# Patient Record
Sex: Male | Born: 1935 | Race: Black or African American | Hispanic: No | Marital: Married | Smoking: Former smoker
Health system: Southern US, Community
[De-identification: ages and names within clinical notes are randomized; demographics above are authoritative.]

## PROBLEM LIST (undated history)

## (undated) DIAGNOSIS — J8410 Pulmonary fibrosis, unspecified: Secondary | ICD-10-CM

## (undated) DIAGNOSIS — E785 Hyperlipidemia, unspecified: Secondary | ICD-10-CM

## (undated) DIAGNOSIS — I2510 Atherosclerotic heart disease of native coronary artery without angina pectoris: Secondary | ICD-10-CM

## (undated) DIAGNOSIS — G4733 Obstructive sleep apnea (adult) (pediatric): Secondary | ICD-10-CM

## (undated) DIAGNOSIS — I509 Heart failure, unspecified: Secondary | ICD-10-CM

## (undated) DIAGNOSIS — N184 Chronic kidney disease, stage 4 (severe): Secondary | ICD-10-CM

## (undated) DIAGNOSIS — I10 Essential (primary) hypertension: Secondary | ICD-10-CM

## (undated) DIAGNOSIS — J449 Chronic obstructive pulmonary disease, unspecified: Secondary | ICD-10-CM

## (undated) DIAGNOSIS — I272 Pulmonary hypertension, unspecified: Secondary | ICD-10-CM

## (undated) DIAGNOSIS — E119 Type 2 diabetes mellitus without complications: Secondary | ICD-10-CM

## (undated) DIAGNOSIS — I5032 Chronic diastolic (congestive) heart failure: Secondary | ICD-10-CM

## (undated) HISTORY — DX: Pulmonary hypertension, unspecified: I27.20

## (undated) HISTORY — DX: Essential (primary) hypertension: I10

## (undated) HISTORY — PX: KNEE SURGERY: SHX244

## (undated) HISTORY — PX: BACK SURGERY: SHX140

## (undated) HISTORY — PX: OTHER SURGICAL HISTORY: SHX169

## (undated) HISTORY — DX: Type 2 diabetes mellitus without complications: E11.9

## (undated) HISTORY — PX: HERNIA REPAIR: SHX51

---

## 2004-06-12 ENCOUNTER — Emergency Department: Payer: Self-pay | Admitting: General Practice

## 2005-10-11 ENCOUNTER — Ambulatory Visit: Payer: Self-pay | Admitting: Oncology

## 2005-10-22 ENCOUNTER — Ambulatory Visit: Payer: Self-pay | Admitting: Gastroenterology

## 2006-01-16 ENCOUNTER — Emergency Department: Payer: Self-pay | Admitting: Internal Medicine

## 2006-06-16 ENCOUNTER — Ambulatory Visit: Payer: Self-pay | Admitting: Surgery

## 2006-06-23 ENCOUNTER — Ambulatory Visit: Payer: Self-pay | Admitting: Surgery

## 2007-07-08 ENCOUNTER — Ambulatory Visit: Payer: Self-pay | Admitting: Cardiovascular Disease

## 2007-11-23 ENCOUNTER — Ambulatory Visit: Payer: Self-pay | Admitting: Specialist

## 2007-11-23 ENCOUNTER — Other Ambulatory Visit: Payer: Self-pay

## 2007-12-01 ENCOUNTER — Inpatient Hospital Stay: Payer: Self-pay | Admitting: Specialist

## 2007-12-05 ENCOUNTER — Encounter: Payer: Self-pay | Admitting: Internal Medicine

## 2007-12-28 ENCOUNTER — Encounter: Payer: Self-pay | Admitting: Internal Medicine

## 2010-01-06 ENCOUNTER — Emergency Department: Payer: Self-pay | Admitting: Emergency Medicine

## 2010-01-28 ENCOUNTER — Emergency Department: Payer: Self-pay | Admitting: Emergency Medicine

## 2010-12-21 ENCOUNTER — Encounter: Payer: Self-pay | Admitting: Podiatry

## 2010-12-21 DIAGNOSIS — M1990 Unspecified osteoarthritis, unspecified site: Secondary | ICD-10-CM

## 2010-12-21 DIAGNOSIS — I519 Heart disease, unspecified: Secondary | ICD-10-CM | POA: Insufficient documentation

## 2011-02-12 ENCOUNTER — Inpatient Hospital Stay: Payer: Self-pay | Admitting: Internal Medicine

## 2011-04-29 ENCOUNTER — Emergency Department: Payer: Self-pay | Admitting: Emergency Medicine

## 2011-09-18 ENCOUNTER — Ambulatory Visit: Payer: Self-pay | Admitting: Gastroenterology

## 2011-09-24 ENCOUNTER — Ambulatory Visit: Payer: Self-pay | Admitting: Gastroenterology

## 2013-01-11 ENCOUNTER — Emergency Department: Payer: Self-pay | Admitting: Emergency Medicine

## 2013-01-13 LAB — BASIC METABOLIC PANEL
BUN: 30 mg/dL — ABNORMAL HIGH (ref 7–18)
Calcium, Total: 8.8 mg/dL (ref 8.5–10.1)
Co2: 27 mmol/L (ref 21–32)
Creatinine: 2.87 mg/dL — ABNORMAL HIGH (ref 0.60–1.30)
EGFR (African American): 23 — ABNORMAL LOW
EGFR (Non-African Amer.): 20 — ABNORMAL LOW
Glucose: 173 mg/dL — ABNORMAL HIGH (ref 65–99)
Potassium: 3.7 mmol/L (ref 3.5–5.1)
Sodium: 135 mmol/L — ABNORMAL LOW (ref 136–145)

## 2013-01-13 LAB — CBC
MCHC: 33.4 g/dL (ref 32.0–36.0)
MCV: 74 fL — ABNORMAL LOW (ref 80–100)
Platelet: 170 10*3/uL (ref 150–440)
RDW: 15.8 % — ABNORMAL HIGH (ref 11.5–14.5)
WBC: 13.2 10*3/uL — ABNORMAL HIGH (ref 3.8–10.6)

## 2013-01-13 LAB — BETA STREP CULTURE(ARMC)

## 2013-01-13 LAB — CK TOTAL AND CKMB (NOT AT ARMC)
CK, Total: 668 U/L — ABNORMAL HIGH (ref 35–232)
CK-MB: 1.5 ng/mL (ref 0.5–3.6)

## 2013-01-14 ENCOUNTER — Inpatient Hospital Stay: Payer: Self-pay | Admitting: Family Medicine

## 2013-01-14 LAB — PRO B NATRIURETIC PEPTIDE: B-Type Natriuretic Peptide: 1240 pg/mL — ABNORMAL HIGH (ref 0–450)

## 2013-01-15 LAB — CBC WITH DIFFERENTIAL/PLATELET
Basophil %: 0.3 %
Eosinophil #: 0.7 10*3/uL (ref 0.0–0.7)
Eosinophil %: 5.7 %
HCT: 34.2 % — ABNORMAL LOW (ref 40.0–52.0)
HGB: 11.7 g/dL — ABNORMAL LOW (ref 13.0–18.0)
Lymphocyte #: 1.3 10*3/uL (ref 1.0–3.6)
Lymphocyte %: 9.8 %
MCH: 25.1 pg — ABNORMAL LOW (ref 26.0–34.0)
MCHC: 34.1 g/dL (ref 32.0–36.0)
Monocyte #: 1 x10 3/mm (ref 0.2–1.0)
Platelet: 186 10*3/uL (ref 150–440)
RBC: 4.66 10*6/uL (ref 4.40–5.90)
RDW: 15.7 % — ABNORMAL HIGH (ref 11.5–14.5)

## 2013-01-15 LAB — BASIC METABOLIC PANEL
Anion Gap: 6 — ABNORMAL LOW (ref 7–16)
BUN: 32 mg/dL — ABNORMAL HIGH (ref 7–18)
Calcium, Total: 9 mg/dL (ref 8.5–10.1)
Chloride: 103 mmol/L (ref 98–107)
Creatinine: 2.61 mg/dL — ABNORMAL HIGH (ref 0.60–1.30)
EGFR (African American): 26 — ABNORMAL LOW
EGFR (Non-African Amer.): 22 — ABNORMAL LOW
Glucose: 95 mg/dL (ref 65–99)
Osmolality: 277 (ref 275–301)

## 2013-01-16 LAB — BASIC METABOLIC PANEL
Anion Gap: 6 — ABNORMAL LOW (ref 7–16)
BUN: 29 mg/dL — ABNORMAL HIGH (ref 7–18)
Chloride: 106 mmol/L (ref 98–107)
Creatinine: 2.2 mg/dL — ABNORMAL HIGH (ref 0.60–1.30)
EGFR (African American): 32 — ABNORMAL LOW
EGFR (Non-African Amer.): 27 — ABNORMAL LOW
Potassium: 3.9 mmol/L (ref 3.5–5.1)
Sodium: 139 mmol/L (ref 136–145)

## 2013-01-16 LAB — CBC WITH DIFFERENTIAL/PLATELET
Basophil #: 0.1 10*3/uL (ref 0.0–0.1)
Eosinophil #: 0.5 10*3/uL (ref 0.0–0.7)
HCT: 35.4 % — ABNORMAL LOW (ref 40.0–52.0)
HGB: 11.9 g/dL — ABNORMAL LOW (ref 13.0–18.0)
MCH: 24.7 pg — ABNORMAL LOW (ref 26.0–34.0)
MCHC: 33.6 g/dL (ref 32.0–36.0)
MCV: 74 fL — ABNORMAL LOW (ref 80–100)
Monocyte %: 8 %
Platelet: 210 10*3/uL (ref 150–440)
RBC: 4.81 10*6/uL (ref 4.40–5.90)
RDW: 15.7 % — ABNORMAL HIGH (ref 11.5–14.5)

## 2013-01-19 LAB — CULTURE, BLOOD (SINGLE)

## 2013-01-19 LAB — EXPECTORATED SPUTUM ASSESSMENT W GRAM STAIN, RFLX TO RESP C

## 2013-02-07 ENCOUNTER — Emergency Department: Payer: Self-pay | Admitting: Emergency Medicine

## 2013-02-07 LAB — BASIC METABOLIC PANEL
Anion Gap: 3 — ABNORMAL LOW (ref 7–16)
Chloride: 109 mmol/L — ABNORMAL HIGH (ref 98–107)
EGFR (African American): 38 — ABNORMAL LOW
Osmolality: 293 (ref 275–301)
Potassium: 4.4 mmol/L (ref 3.5–5.1)

## 2013-02-07 LAB — CBC
HGB: 11.9 g/dL — ABNORMAL LOW (ref 13.0–18.0)
MCH: 25.1 pg — ABNORMAL LOW (ref 26.0–34.0)
MCHC: 33.2 g/dL (ref 32.0–36.0)
MCV: 76 fL — ABNORMAL LOW (ref 80–100)
Platelet: 166 10*3/uL (ref 150–440)
RBC: 4.76 10*6/uL (ref 4.40–5.90)
RDW: 17.3 % — ABNORMAL HIGH (ref 11.5–14.5)
WBC: 11.9 10*3/uL — ABNORMAL HIGH (ref 3.8–10.6)

## 2013-02-07 LAB — TROPONIN I
Troponin-I: <0.02 ng/mL
Troponin-I: <0.02 ng/mL

## 2013-02-07 LAB — PRO B NATRIURETIC PEPTIDE: B-Type Natriuretic Peptide: 189 pg/mL (ref 0–450)

## 2013-02-07 LAB — PROTIME-INR: Prothrombin Time: 13.9 secs (ref 11.5–14.7)

## 2013-02-07 LAB — CK TOTAL AND CKMB (NOT AT ARMC): CK, Total: 99 U/L (ref 35–232)

## 2013-02-12 ENCOUNTER — Emergency Department: Payer: Self-pay | Admitting: Emergency Medicine

## 2013-02-12 LAB — BASIC METABOLIC PANEL
Anion Gap: 8 (ref 7–16)
BUN: 22 mg/dL — ABNORMAL HIGH (ref 7–18)
Calcium, Total: 9 mg/dL (ref 8.5–10.1)
Chloride: 108 mmol/L — ABNORMAL HIGH (ref 98–107)
Sodium: 139 mmol/L (ref 136–145)

## 2013-02-12 LAB — CBC
HCT: 36 % — ABNORMAL LOW (ref 40.0–52.0)
MCHC: 33.6 g/dL (ref 32.0–36.0)
Platelet: 161 10*3/uL (ref 150–440)
RBC: 4.77 10*6/uL (ref 4.40–5.90)
RDW: 17.5 % — ABNORMAL HIGH (ref 11.5–14.5)

## 2013-02-12 LAB — PRO B NATRIURETIC PEPTIDE: B-Type Natriuretic Peptide: 420 pg/mL (ref 0–450)

## 2013-02-12 LAB — TROPONIN I: Troponin-I: <0.02 ng/mL

## 2013-02-16 ENCOUNTER — Observation Stay: Payer: Self-pay | Admitting: Internal Medicine

## 2013-02-16 LAB — CBC
MCH: 25 pg — ABNORMAL LOW (ref 26.0–34.0)
MCHC: 33.4 g/dL (ref 32.0–36.0)
MCV: 75 fL — ABNORMAL LOW (ref 80–100)
Platelet: 174 10*3/uL (ref 150–440)
RBC: 5.08 10*6/uL (ref 4.40–5.90)
RDW: 17.2 % — ABNORMAL HIGH (ref 11.5–14.5)
WBC: 13.1 10*3/uL — ABNORMAL HIGH (ref 3.8–10.6)

## 2013-02-16 LAB — BASIC METABOLIC PANEL
BUN: 21 mg/dL — ABNORMAL HIGH (ref 7–18)
Chloride: 107 mmol/L (ref 98–107)
Creatinine: 1.94 mg/dL — ABNORMAL HIGH (ref 0.60–1.30)
EGFR (Non-African Amer.): 32 — ABNORMAL LOW
Glucose: 54 mg/dL — ABNORMAL LOW (ref 65–99)
Potassium: 3.7 mmol/L (ref 3.5–5.1)

## 2013-02-16 LAB — CK TOTAL AND CKMB (NOT AT ARMC)
CK, Total: 58 U/L (ref 35–232)
CK-MB: 1.3 ng/mL (ref 0.5–3.6)

## 2013-02-16 LAB — PRO B NATRIURETIC PEPTIDE: B-Type Natriuretic Peptide: 367 pg/mL (ref 0–450)

## 2013-02-16 LAB — TROPONIN I
Troponin-I: <0.02 ng/mL
Troponin-I: <0.02 ng/mL

## 2013-06-16 ENCOUNTER — Ambulatory Visit: Payer: Self-pay | Admitting: Podiatry

## 2013-07-12 ENCOUNTER — Encounter: Payer: Self-pay | Admitting: Podiatry

## 2013-07-12 ENCOUNTER — Ambulatory Visit (INDEPENDENT_AMBULATORY_CARE_PROVIDER_SITE_OTHER): Payer: Medicare Other | Admitting: Podiatry

## 2013-07-12 VITALS — BP 123/61 | HR 74 | Resp 16 | Ht 71.0 in | Wt 240.0 lb

## 2013-07-12 DIAGNOSIS — M79609 Pain in unspecified limb: Secondary | ICD-10-CM

## 2013-07-12 DIAGNOSIS — B351 Tinea unguium: Secondary | ICD-10-CM

## 2013-07-12 NOTE — Progress Notes (Signed)
Chief complaint of painful toenails one through 5 bilateral.  Objective: Pulses remain palpable. Nails are thick yellow dystrophic onychomycotic.  Assessment: Pain in limb secondary to onychomycosis 1 through 5 bilateral.  Plan: Debridement of nails 1 through 5 bilateral is cover service secondary to pain.

## 2013-10-11 ENCOUNTER — Ambulatory Visit (INDEPENDENT_AMBULATORY_CARE_PROVIDER_SITE_OTHER): Payer: Medicare Other | Admitting: Podiatry

## 2013-10-11 VITALS — BP 153/64 | HR 121 | Resp 16 | Ht 71.0 in | Wt 242.0 lb

## 2013-10-11 DIAGNOSIS — M79609 Pain in unspecified limb: Secondary | ICD-10-CM

## 2013-10-11 DIAGNOSIS — B351 Tinea unguium: Secondary | ICD-10-CM

## 2013-10-11 NOTE — Progress Notes (Signed)
He presents today chief complaint of painfully elongated toenails one through 5 bilateral.  Objective: Vital signs are stable he is alert and oriented x3. Pulses are palpable bilateral. Capillary fill time to digits one through 5 is noted to be immediate. Neurologic sensorium is intact per since once the monofilament. Nails are thick yellow dystrophic onychomycotic and painful palpation 1 through 5 bilateral.  Assessment: Pain in limb secondary to onychomycosis 1 through 5 bilateral.  Plan: Debridement of nails 1 through 5 bilateral covered service secondary to pain

## 2013-11-11 DIAGNOSIS — D500 Iron deficiency anemia secondary to blood loss (chronic): Secondary | ICD-10-CM | POA: Insufficient documentation

## 2013-11-11 DIAGNOSIS — D509 Iron deficiency anemia, unspecified: Secondary | ICD-10-CM | POA: Insufficient documentation

## 2013-11-14 DIAGNOSIS — D582 Other hemoglobinopathies: Secondary | ICD-10-CM | POA: Insufficient documentation

## 2013-11-14 DIAGNOSIS — R5383 Other fatigue: Secondary | ICD-10-CM | POA: Insufficient documentation

## 2013-11-14 DIAGNOSIS — E119 Type 2 diabetes mellitus without complications: Secondary | ICD-10-CM | POA: Insufficient documentation

## 2013-11-14 DIAGNOSIS — N1832 Chronic kidney disease, stage 3b: Secondary | ICD-10-CM | POA: Insufficient documentation

## 2013-11-14 DIAGNOSIS — M1990 Unspecified osteoarthritis, unspecified site: Secondary | ICD-10-CM | POA: Insufficient documentation

## 2013-11-14 DIAGNOSIS — I2510 Atherosclerotic heart disease of native coronary artery without angina pectoris: Secondary | ICD-10-CM | POA: Insufficient documentation

## 2013-11-14 DIAGNOSIS — N183 Chronic kidney disease, stage 3 unspecified: Secondary | ICD-10-CM | POA: Insufficient documentation

## 2013-11-14 DIAGNOSIS — I10 Essential (primary) hypertension: Secondary | ICD-10-CM | POA: Insufficient documentation

## 2013-11-14 DIAGNOSIS — M109 Gout, unspecified: Secondary | ICD-10-CM | POA: Insufficient documentation

## 2013-11-14 DIAGNOSIS — E039 Hypothyroidism, unspecified: Secondary | ICD-10-CM | POA: Insufficient documentation

## 2013-11-14 DIAGNOSIS — E7800 Pure hypercholesterolemia, unspecified: Secondary | ICD-10-CM | POA: Insufficient documentation

## 2013-11-29 IMAGING — CR DG CHEST 2V
1 series · 2 of 2 positions shown · non-contrast
Comparison: none

REASON FOR EXAM: Chest Pain
COMMENTS:

PROCEDURE:     DXR - DXR CHEST PA (OR AP) AND LATERAL  - February 07, 2013  [DATE]
RESULT:     Comparison: 01/13/2013

[Series 5: w chest pa · 0.14mm/px · 2 of 2 slices shown]
[im 1/2]
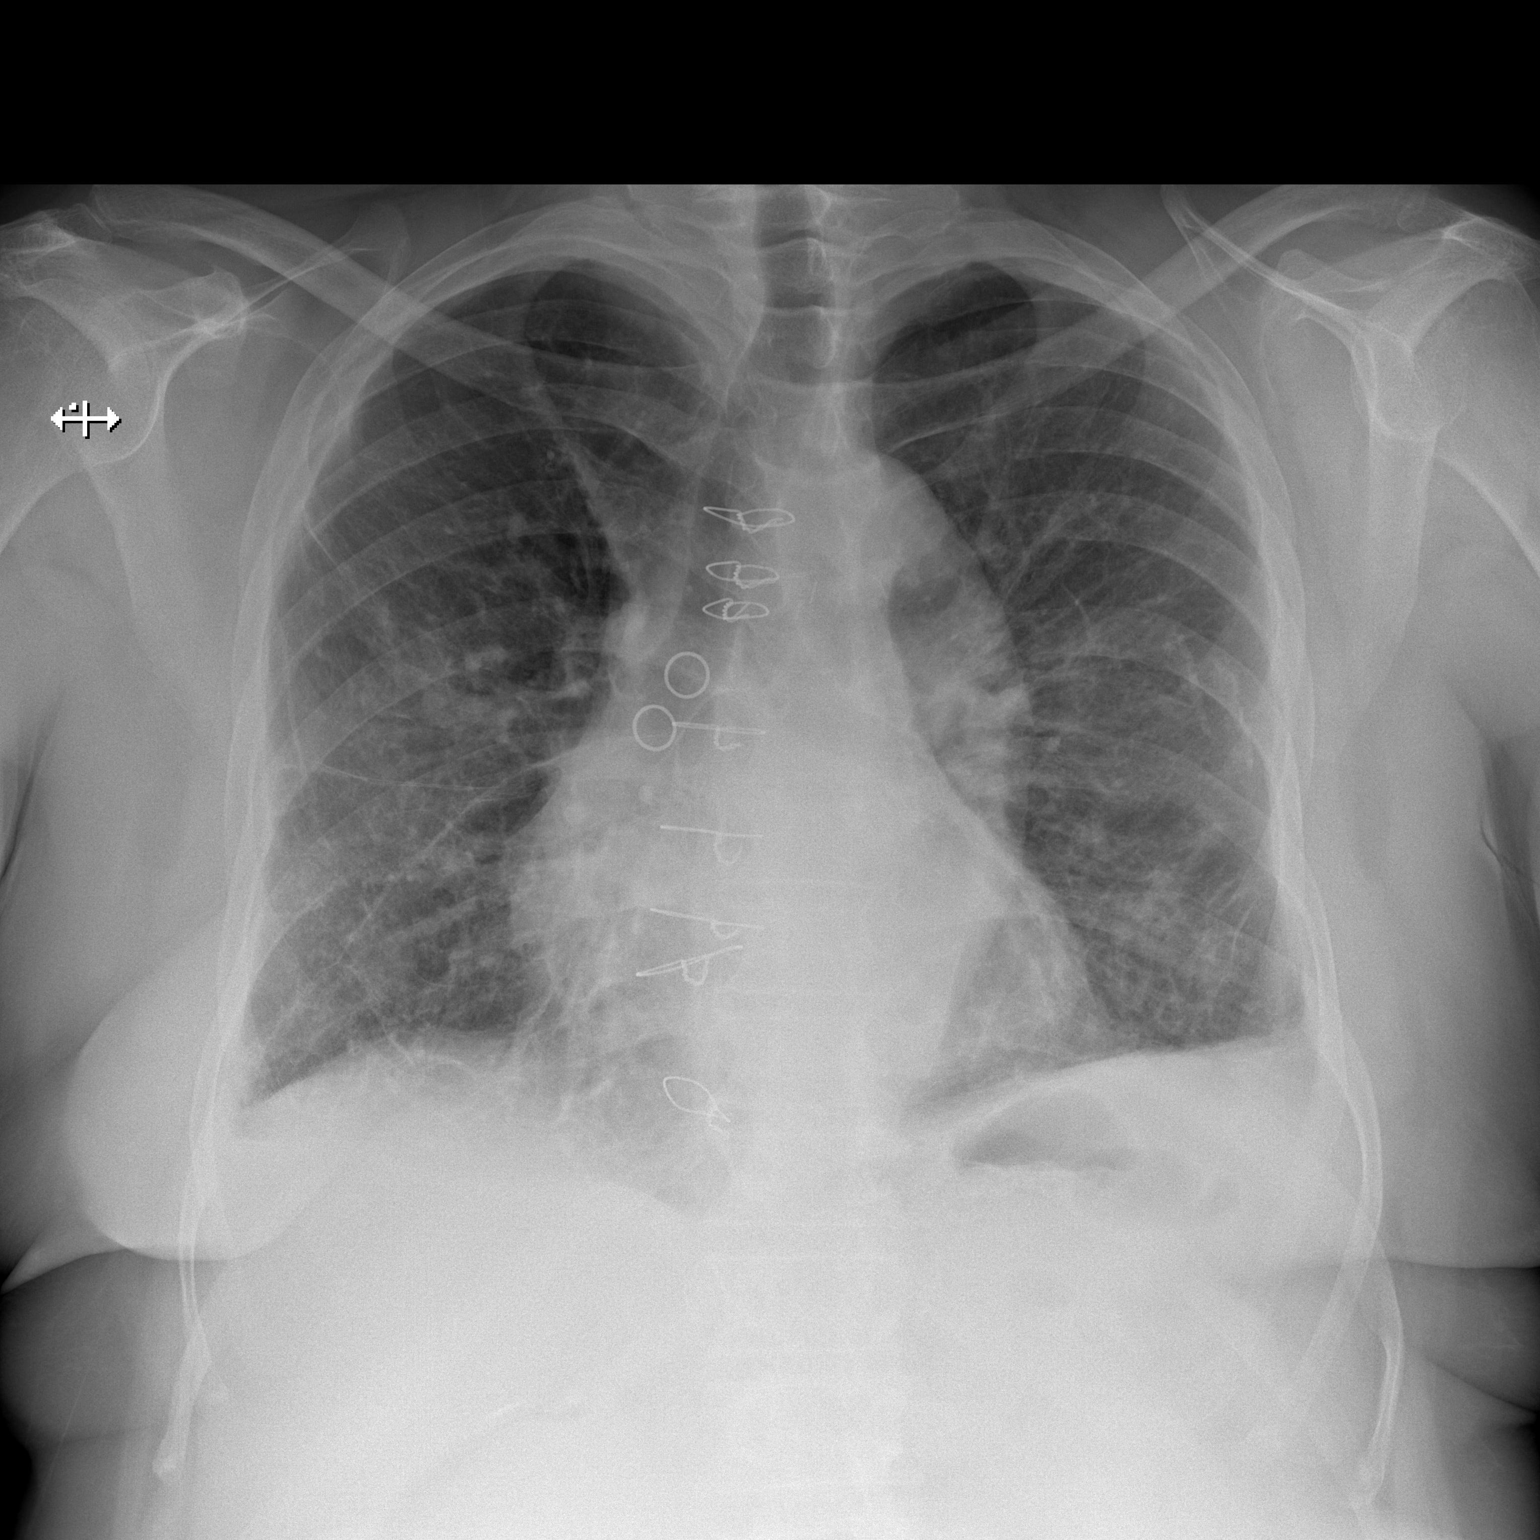
[im 2/2]
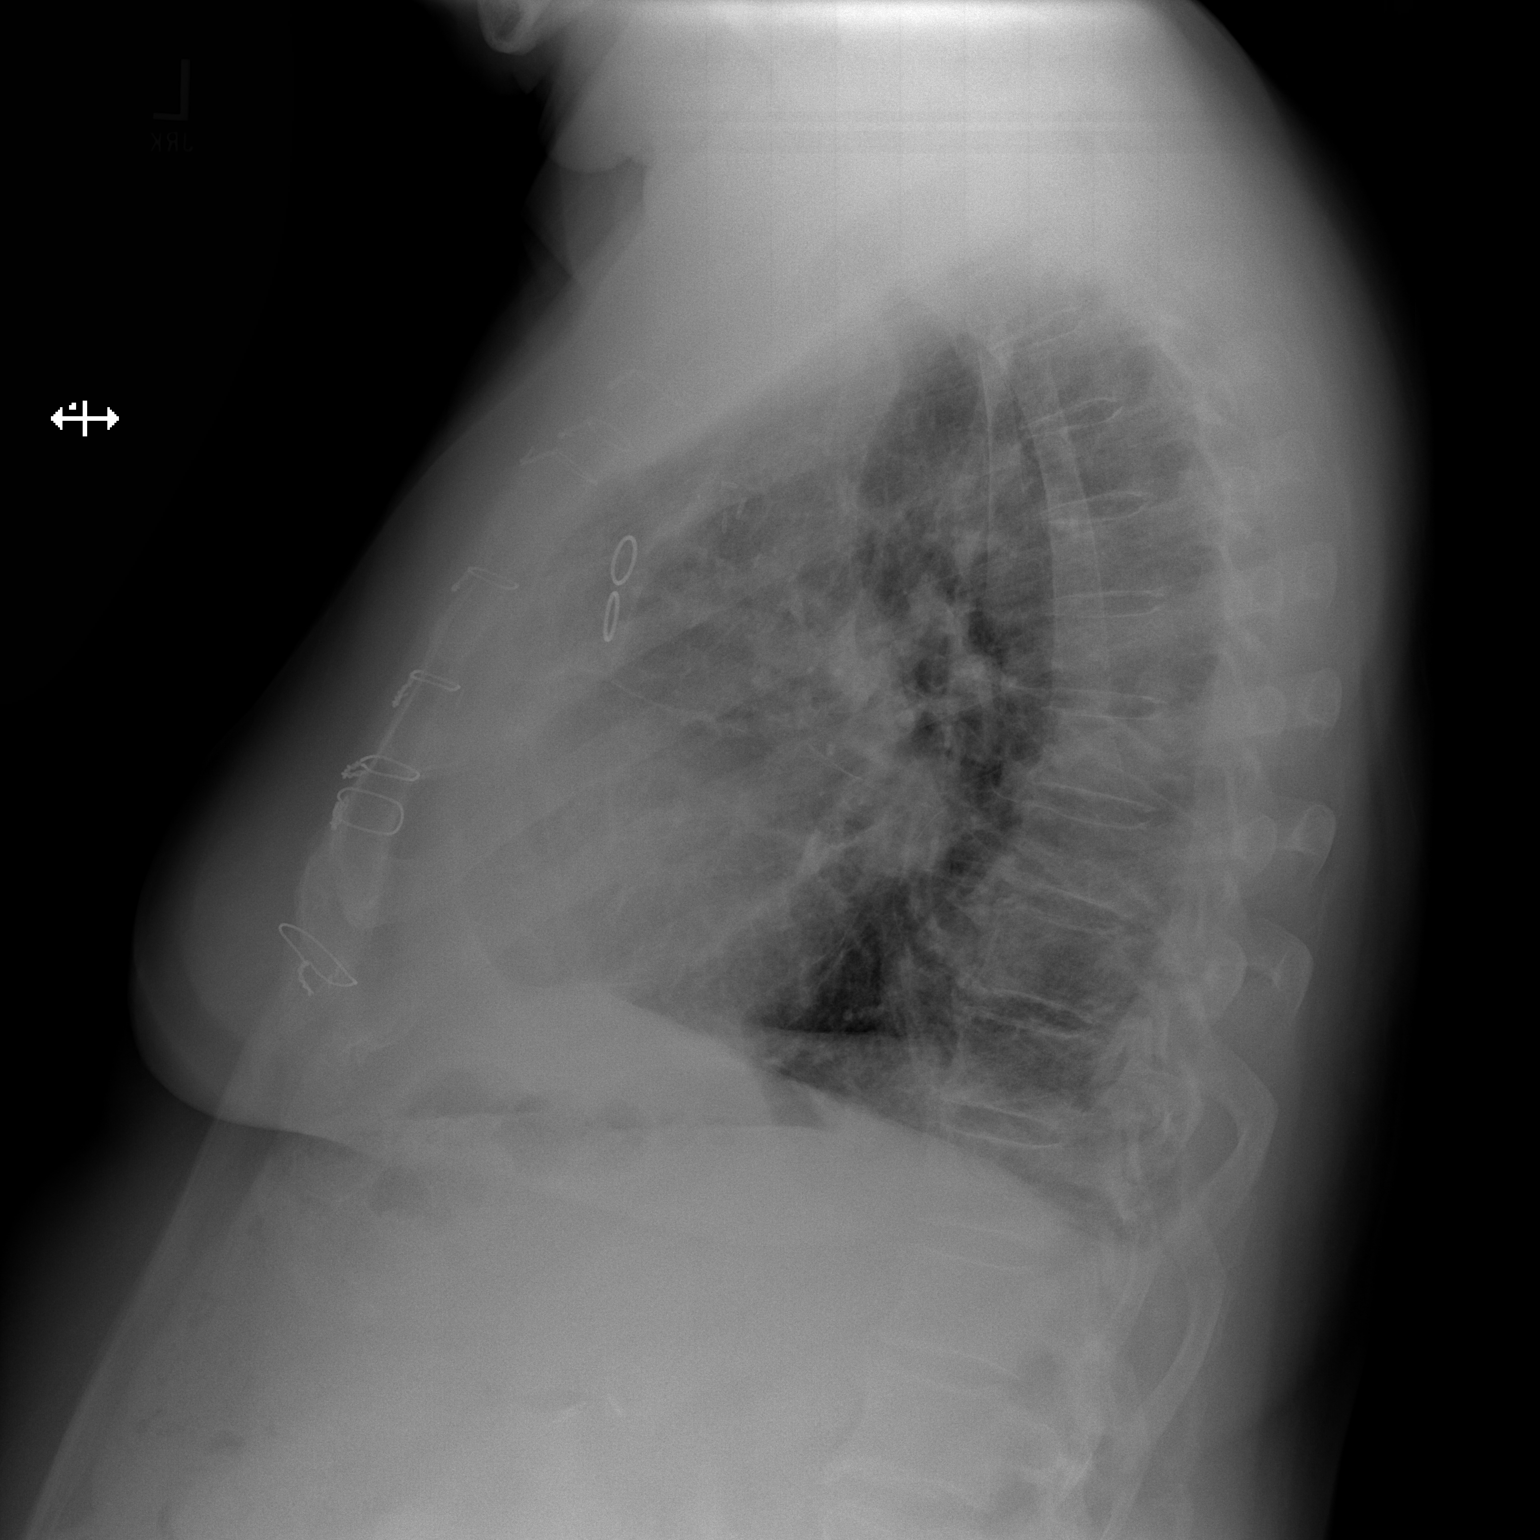

[2 of 2 positions shown; findings below may reference images not displayed]

FINDINGS: The heart and mediastinum are stable. Prior median sternotomy. There are
coarse bilateral opacities which are similar to prior. Possible trace
bilateral pleural effusions versus pleural thickening.
IMPRESSION: Coarse bilateral opacities are similar to prior. D cyst could be secondary
to change of chronic interstitial lung disease or areas of atelectasis and
scarring. However, followup is recommended.

[REDACTED]

## 2014-01-10 ENCOUNTER — Ambulatory Visit: Payer: Medicare Other | Admitting: Podiatry

## 2014-01-13 ENCOUNTER — Ambulatory Visit (INDEPENDENT_AMBULATORY_CARE_PROVIDER_SITE_OTHER): Payer: Medicare Other

## 2014-01-13 ENCOUNTER — Ambulatory Visit (INDEPENDENT_AMBULATORY_CARE_PROVIDER_SITE_OTHER): Payer: Medicare Other | Admitting: Podiatry

## 2014-01-13 VITALS — BP 111/50 | HR 72 | Resp 16

## 2014-01-13 DIAGNOSIS — B351 Tinea unguium: Secondary | ICD-10-CM

## 2014-01-13 DIAGNOSIS — M779 Enthesopathy, unspecified: Secondary | ICD-10-CM

## 2014-01-13 DIAGNOSIS — S92912A Unspecified fracture of left toe(s), initial encounter for closed fracture: Secondary | ICD-10-CM

## 2014-01-13 DIAGNOSIS — S92919A Unspecified fracture of unspecified toe(s), initial encounter for closed fracture: Secondary | ICD-10-CM

## 2014-01-13 DIAGNOSIS — M79609 Pain in unspecified limb: Secondary | ICD-10-CM

## 2014-01-13 DIAGNOSIS — M79673 Pain in unspecified foot: Secondary | ICD-10-CM

## 2014-01-13 NOTE — Progress Notes (Signed)
He presents today complaining of a painful second digit left foot as well as painful elongated toenails.  Pulses are palpable bilateral. Nails are thick yellow dystrophic with mycotic and painful palpation. He has a swollen painful second digit of his left foot is not warm to the touch I see no signs of infection. Radiographic evaluation does demonstrate a pilon type fracture to be head of the proximal phalanx second digit left foot. Osteopenia and osteoarthritis are noted midfoot.  Assessment: Pain in limb secondary to onychomycosis 1 through 5 bilateral. Fractured second toe left.  Plan: Darco shoe was dispensed. Debridement nails 1 through 5 bilateral covered service secondary to pain.

## 2014-04-13 ENCOUNTER — Ambulatory Visit (INDEPENDENT_AMBULATORY_CARE_PROVIDER_SITE_OTHER): Payer: Medicare Other | Admitting: Podiatry

## 2014-04-13 DIAGNOSIS — B351 Tinea unguium: Secondary | ICD-10-CM

## 2014-04-13 DIAGNOSIS — M79673 Pain in unspecified foot: Secondary | ICD-10-CM

## 2014-04-13 DIAGNOSIS — M7750 Other enthesopathy of unspecified foot: Secondary | ICD-10-CM

## 2014-04-13 DIAGNOSIS — M79609 Pain in unspecified limb: Secondary | ICD-10-CM

## 2014-04-13 NOTE — Progress Notes (Signed)
He presents today with a chief complaint of painful elongated toenails as well as pain to his left subtalar joint.  Objective: Vital signs are stable he is alert and oriented x3. There is no erythema edema cellulitis drainage or odor to the right foot however the left does demonstrate some edema overlying the sinus tarsi the left foot. With pain on palpation of the sinus tarsi and on range of motion of the subtalar joint. He does have a history of osteoarthritis and dorsal spurring is also noted on foot. Nails are thick yellow dystrophic with mycotic and painful palpation.  Assessment: Pain in limb secondary to onychomycosis 1 through 5 bilateral. Subtalar joint capsulitis left foot.  Plan: Injected his left subtalar joint today after sterile Betadine skin prep with Kenalog and local anesthetic. I also debrided his nails 1 through 5 bilateral covered service secondary to pain. I will followup with him in 3 months.

## 2014-07-11 ENCOUNTER — Ambulatory Visit (INDEPENDENT_AMBULATORY_CARE_PROVIDER_SITE_OTHER): Payer: Medicare Other | Admitting: Podiatry

## 2014-07-11 ENCOUNTER — Ambulatory Visit: Payer: Medicare Other | Admitting: Podiatry

## 2014-07-11 DIAGNOSIS — B351 Tinea unguium: Secondary | ICD-10-CM

## 2014-07-11 DIAGNOSIS — M79673 Pain in unspecified foot: Secondary | ICD-10-CM

## 2014-07-11 NOTE — Progress Notes (Signed)
He presents today chief complaint of painfully elongated toenails one through 5 bilateral.  Objective: Vital signs are stable he is alert and oriented x3. Pulses are palpable bilateral. Capillary fill time to digits one through 5 is noted to be immediate. Neurologic sensorium is intact per since once the monofilament. Nails are thick yellow dystrophic onychomycotic and painful palpation 1 through 5 bilateral.  Assessment: Pain in limb secondary to onychomycosis 1 through 5 bilateral.  Plan: Debridement of nails 1 through 5 bilateral covered service secondary to pain

## 2014-10-17 ENCOUNTER — Ambulatory Visit (INDEPENDENT_AMBULATORY_CARE_PROVIDER_SITE_OTHER): Payer: Medicare Other | Admitting: Podiatry

## 2014-10-17 DIAGNOSIS — B351 Tinea unguium: Secondary | ICD-10-CM | POA: Diagnosis not present

## 2014-10-17 DIAGNOSIS — M79673 Pain in unspecified foot: Secondary | ICD-10-CM

## 2014-10-17 DIAGNOSIS — M722 Plantar fascial fibromatosis: Secondary | ICD-10-CM

## 2014-10-17 NOTE — Progress Notes (Addendum)
He presents today chief complaint of painfully elongated toenails one through 5 bilateral. He is also complaining of a painful left heel.  Objective: Vital signs are stable he is alert and oriented x3. Pulses are palpable bilateral. Capillary fill time to digits one through 5 is noted to be immediate. Neurologic sensorium is intact per since once the monofilament. Nails are thick yellow dystrophic onychomycotic and painful palpation 1 through 5 bilateral. He has pain on palpation medial calcaneal tubercle of the left heel. No pain on medial and lateral compression of the calcaneus.  Assessment: Pain in limb secondary to onychomycosis 1 through 5 bilateral. Plantar fasciitis left.  Plan: Debridement of nails 1 through 5 bilateral covered service secondary to pain. Injected his left heel today with Kenalog and local anesthetic.

## 2014-11-18 NOTE — Discharge Summary (Signed)
PATIENT NAME:  Jacob Silva, Jacob Silva MR#:  [id number] DATE OF BIRTH:  June 19, 1932  DATE OF ADMISSION:  01/14/2013 DATE OF DISCHARGE:  01/16/2013  ADMISSION CHIEF COMPLAINT:  Cough with sputum production, fever, shortness of breath.   DISCHARGE DIAGNOSES: 1.  Community-acquired pneumonia.  2.  Bronchiectasis.  3.  Bilateral pulmonary infiltrates due to pneumonia and congestive heart failure.  4.  Congestive heart failure, systolic and diastolic exacerbation.  5.  Obstructive sleep apnea, on CPAP.  6.  Pulmonary nodules, follow up with pulmonologist in 3 to 6 months or primary care physician.  7.  Systemic hypertension.  8.  Chronic kidney disease.  9.  Coronary artery disease.  10.  Hypertension.  11.  Chronic disease anemia.  12.  Gout.  13.  Hyperlipidemia.  14.  Hypothyroidism.   MEDICATIONS AT DISCHARGE:   1.  Omeprazole 20 mg daily.  2.  Aspirin 325 mg daily.  3.  Levothyroxine 50 mcg daily.  4.  Coreg 25 mg twice daily.  5.  Iron sulfate 325 mg twice daily.  6.  Lisinopril 40 mg once a day.  7.  Insulin Lantus 35 units at bedtime.  8.  Simvastatin 40 mg at bedtime.  9.  Allopurinol 100 mg daily.  10.  Combivent 2 puffs 4 times a day for two weeks.  11.  Furosemide 40 mg in the morning and 20 mg in the evening.  12.  Levofloxacin 750 mg every 24 hours for 9 days.   FOLLOW-UP:  With Dr. Brunetta Genera in 1 to 2 weeks.  Message for Dr. Brunetta Genera, please do a referral to pulmonary outpatient for follow-up on bronchiectasis and lung nodules.   HOSPITAL COURSE:  Jacob Silva is a very nice 81 year old gentleman who has history of congestive heart failure, hypertension, lung nodules, bronchiectasis, he is a former smoker and has never been told that he has COPD, but he has been diagnosed with sleep apnea.  The patient comes on 01/14/2013 with a history of cough with sputum production, fever and shortness of breath, presented to the Emergency Department after two days of having some trouble  breathing.  The cough and the sputum production became very severe within the last 24 hours prior to coming to the hospital.  He was treated for bronchitis and given Bactrim by his primary care physician, but his symptoms got worse.  His shortness of breath was severe and he needed to be taking breaks in between moving around in the house.  The patient was evaluated with a CT scan that revealed bilateral consolidation, left more than right and evidence of bronchiectasis.  The patient was admitted and had a significant quick recovery.  1.  Bilateral infiltrates, that was a combination of things, pneumonia and CHF.  The patient treated for both.  2.  Pneumonia.  The patient is admitted for community-acquired pneumonia with bronchiectasis.  He had significant amount of phlegm.  The patient has never been diagnosed with pseudomonas in the past, so he is not colonized.  Sputum culture was obtained, but the colonies were too small to read.  The patient had a significant improvement with just Levaquin for what we are just going to continue that as an outpatient.  The patient is discharged also on inhalers that he has not been ever prescribed and we are going to recommend to follow up with pulmonary.  The patient will talk with Dr. Brunetta Genera to get his recommendation as far as who to see outpatient.  The patient also  had pulmonary nodules on that CT scan for what is recommended to follow-up CT scan in 3 to 6 months.  3.  He has chronic kidney disease and his creatinine was slightly elevated on admission up to 2.87, at discharge is 2.2, is close to baseline.  His baseline is around 2.1.   4.  He had an elevation of white count of 12.9, but he has been taking his steroids at home.  His hemoglobin was 11.9 and his platelets 210.  5.  As far as his CHF, the patient was already taking a beta-blocker and an ACE inhibitor.  His ejection fraction is 40%.  He has been seen in the past by Dr. Neoma Laming for what we recommended  him to follow up back with him.  The patient had an increase of the dose of furosemide to 40 mg in the morning and 20 mg in the evening.  He is tolerating that pretty well.  6.  Other medical problems were stable in this hospitalization.  The patient progressed very well.  I spent about 45 minutes with this discharge today.    ____________________________ Optima Sink, MD rsg:ea D: 01/16/2013 12:36:40 ET T: 01/16/2013 20:07:36 ET JOB#: [id number]  cc: [place] Sink, MD, <Dictator> Meindert A. Brunetta Genera, MD Dionisio David, MD  [place] MD ELECTRONICALLY SIGNED 01/25/2013 6:44

## 2014-11-18 NOTE — H&P (Signed)
PATIENT NAME:  Jacob Silva, Jacob Silva MR#:  [id number] DATE OF BIRTH:  October 09, 1931  DATE OF ADMISSION:  01/14/2013  PRIMARY CARE PHYSICIAN: Dr. Lorelee Market.    REFERRING PHYSICIAN: Marjean Donna.   CHIEF COMPLAINT: Cough, sputum production, fever, shortness of breath.   HISTORY OF PRESENT ILLNESS: Mr. Felland is a 81 year old African American male with history of obstructive sleep apnea syndrome, coronary artery disease and chronic kidney disease. He came to the Emergency Department 2 days ago with shortness of breath, cough and sputum production. He was treated for bronchitis and given Bactrim; however, he came back again today, that his symptoms are worse. He has more shortness of breath. Sputum changed in color from white to yellow. He has some pain in the upper chest area when he coughs and back pain, and he has more fever. Evaluation here with chest x-ray and then CAT scan of the chest revealed bilateral consolidation, more on the left than the right. He has also evidence of bronchiectasis. The patient was admitted for further evaluation and treatment.    REVIEW OF SYSTEMS:  CONSTITUTIONAL: He reports fever and a few chills. He has fatigue.  EYES: No blurring of vision. No double vision.  ENT: He has chronic bilateral hearing impairment, using hearing aids. No sore throat. No dysphagia.  CARDIOVASCULAR: Chest pain. This is occasional when he coughs. Has shortness of breath. No syncope.  RESPIRATORY: Shortness of breath, wheezing, cough and sputum production. No hemoptysis.  GASTROINTESTINAL: No abdominal pain. No vomiting. No diarrhea. No hematochezia. No melena.  GENITOURINARY: No dysuria. No frequency of urination.  MUSCULOSKELETAL: No joint pain or swelling. No muscular pain or swelling other than his gout.  INTEGUMENTARY: No skin rash. No ulcers.  NEUROLOGY: No focal weakness. No seizure activity. No headache.  PSYCHIATRY: No anxiety. No depression.  ENDOCRINE: No polyuria or  polydipsia. No heat or cold intolerance. The patient is known to have diabetes.   PAST MEDICAL HISTORY: Obstructive sleep apnea syndrome on CPAP at night. Coronary artery disease status post coronary artery bypass graft. Systemic hypertension. Noninsulin-dependent diabetes mellitus. Right now, he is on Lantus. Hypothyroidism. Anemia, followed up by his hematologist. His wife reports that he has rare type of anemia but could not specify what type.   PAST SURGICAL HISTORY: Coronary artery bypass graft, cholecystectomy, back surgery, cataract surgery, right total knee replacement.   SOCIAL HABITS: Ex-chronic smoker. He quit many years ago. He has past history of 10 years of on and off smoking. No history of alcohol or other drug abuse.   FAMILY HISTORY: He had a brother who died recently from lung cancer and esophageal cancer. His father was alcoholic and died from complications of liver cirrhosis. His mother was diabetic, and she died from complications of advanced chronic kidney disease.   SOCIAL HISTORY: He is retired from working with the [employer] and also with [employer]. He is married, living with his wife.   ADMISSION MEDICATIONS: Lantus 35 units every night. Omeprazole 20 mg a day. Lasix 40 mg twice a day. Levothyroxine 50 mcg once a day. Lisinopril 40 mg a day. Allopurinol 100 mg a day. Simvastatin 40 mg a day. Coreg 25 mg twice a day. Aspirin 325 mg a day.   ALLERGIES: PENICILLIN CAUSING SKIN RASH.   PHYSICAL EXAMINATION:  VITAL SIGNS: Blood pressure 142/65, respiratory rate 24, temperature 98.4, oxygen saturation 96%.  GENERAL APPEARANCE: Elderly male lying in bed in no acute distress.  HEAD AND NECK: No pallor. No icterus. No cyanosis.  Ear examination revealed the patient is wearing hearing aids bilaterally. No discharge, no lesions. Examination of the nose showed no bleeding, no ulcers, no discharge. Oropharyngeal examination revealed normal lips and tongue. No oral thrush, no  ulcers. He is edentulous, wearing his dentures. Eye examination revealed normal eyelids and conjunctivae. Pupils about 4 mm, equal and reactive to light. Neck is supple. Trachea at midline. No thyromegaly. No cervical lymphadenopathy. No masses.  HEART: Normal S1, S2. No S3, S4. No murmur. No gallop. No carotid bruits.  RESPIRATORY: Normal breathing pattern without use of accessory muscles. He has bilateral rhonchi and scattered crackles on both sides of the chest, especially in the lower zones.  ABDOMEN: Soft without tenderness. No hepatosplenomegaly. No masses. No hernias.  SKIN: No ulcers. No subcutaneous nodules  MUSCULOSKELETAL: No joint swelling. No clubbing.  NEUROLOGIC: Cranial nerves II through XII were intact. No focal motor deficit.  PSYCHIATRIC: The patient is alert, oriented to place and people and time. Mood and affect were normal.   LABORATORY FINDINGS: His chest x-ray showed bilateral infiltrates and some chronic lung changes and peribronchial cuffing. CT scan of the chest revealed bilateral lower lobe bronchiectasis with coarse reticular opacities. Bibasilar opacities greater on the left than the right, consistent with pneumonia. Indeterminate subcarinal lymphadenopathy. His CBC showed white count of 13,000, hemoglobin 12, hematocrit 36, platelet count 170. CPK was 668, troponin less than 0.02. Serum glucose 173. B-type natriuretic peptide was 1240. BUN 30, creatinine 2.8, sodium 135, potassium 3.7. Estimated GFR 23.   ASSESSMENT:  1. Bilateral pneumonia, worse on the left than the right.  2. Bronchiectasis.  3. Obstructive sleep apnea, on CPAP treatment.  4. Systemic hypertension.  5. Chronic kidney disease stage IV.  6. Coronary artery disease and hypertension.  7. Anemia.  8. Gout.  9. Hyperlipidemia.  10. Hypothyroidism.   PLAN: Will admit to the medical floor. Blood cultures x2. Start intravenous Levaquin. Oxygen supplementation. Accu-Chek and sliding scale. Continue  Lantus. Continue home medications as listed above. For deep vein thrombosis prophylaxis, will use Lovenox 30 mg subcutaneous once a day given his chronic kidney disease. Will monitor his response. The patient indicates he does not have a Living Will; however, his code is status is FULL CODE.   Time spent in evaluating this patient took more than 1 hour.   ____________________________ Clovis Pu. Lenore Manner, MD amd:gb D: 01/14/2013 02:44:36 ET T: 01/14/2013 03:17:27 ET JOB#: [id number]  cc: Clovis Pu. Lenore Manner, MD, <Dictator> Mike Craze Irven Coe MD ELECTRONICALLY SIGNED 01/15/2013 3:15

## 2014-11-18 NOTE — Consult Note (Signed)
Brief Consult Note: Diagnosis: CP.   Consult note dictated.   Comments: Patient with recurrent pain in L chest, shoulder, arm. Pain non-exertional and sharp, could be musculoskeletal verses ischemia. Patient has CKD, therefore not a candidate for cardiac cath and not CP free. Continue medical management, will get x-ray of shoulder as pain worse when lies on L side/shoulder. No acute EKG changes, TNI neg x 2.  Cycle xCE  3 and if negative and remains CP free can f/u in office for echo and stress testing.  Electronic Signatures: Angelica Ran (MD)   (Signed 11-Aug-14 08:08)  Co-Signer: Brief Consult Note Merla Riches (PA-C)   (Signed 22-Jul-14 08:48)  Authored: Brief Consult Note  Last Updated: 11-Aug-14 08:08 by Angelica Ran (MD)

## 2014-11-18 NOTE — Consult Note (Signed)
PATIENT NAME:  Jacob Silva, Jacob Silva MR#:  [id number] DATE OF BIRTH:  [birth year]  DATE OF CONSULTATION:  02/16/2013  REFERRING PHYSICIAN: Phillips Climes, MD  CONSULTING PHYSICIAN:  Neoma Laming, MD  REASON FOR CONSULTATION: Chest pain.   HISTORY OF PRESENT ILLNESS: Jacob Silva is a 81 year old African American male, who is well known to our office. He has a past medical history significant for obstructive sleep apnea, coronary artery disease, status post coronary bypass grafting and chronic kidney disease. The patient has been seen in the Emergency Room several times and yesterday in our office had complained of chest pressure. Last evening when he lied on his left shoulder, he developed some sharp pain in between his shoulder blades that radiated around to his left upper chest. The pain lasted about 30 minutes and then resolved. He has some associated shortness of breath and he has palpitations. Yesterday, in our office he had his medications adjusted. EKG on ER presentation did not show anything significant. The patient had cardiac cath done in the last few years that did not show any significant disease.   PAST MEDICAL HISTORY:  1.  Obstructive sleep apnea, on CPAP, 2.  Coronary artery disease, status post coronary artery bypass grafting.  3.  Hyperlipidemia.  4.  Diabetes mellitus.  5.  Hypothyroidism.  6.  Anemia.   PAST SURGICAL HISTORY:  1.  Coronary artery bypass grafting. 2.  Cholecystectomy. 3.  Back surgery.  4.  Cataract surgery.  5.  Right total knee replacement.   ALLERGIES: PENICILLIN AND LEVAQUIN.   HOME MEDICATIONS:  1.  Aspirin 325 mg p.o. daily.  2.  Imdur 60 mg p.o. b.i.d.  3.  Ranexa 1000 mg p.o. b.i.d.  4.  Gabapentin 400 mg p.o. b.i.d.  5.  Lantus 35 units subcutaneously at bedtime.  6.  Allopurinol 100 mg p.o. daily.  7.  Simvastatin 20 mg p.o. at bedtime.  7.  Doxycycline 1 tablet p.o. b.i.d.  8.  Carvedilol 25 mg p.o. b.i.d.  9.  Lasix 40 mg q.a.m. and  20 mg q.p.m.  10. Omeprazole 20 mg p.o. daily.  11. Levothyroxine 50 mcg p.o. q.a.m.  12. Hydralazine 50 mg p.o. b.i.d.   SOCIAL HISTORY: The patient is an ex-smoker, quit many years ago. He does not use alcohol or illicit drug use.   FAMILY HISTORY: Significant for lung cancer and esophageal cancer in his brother. Diabetes mellitus in his mother.   REVIEW OF SYSTEMS: GENERAL: The patient denies any fever or chills, but does have weakness and fatigue. EYES: The patient denies any blurry vision/double vision. ENT: The patient denies any tinnitus or epistaxis. RESPIRATORY: The patient has some shortness of breath. Denies any coughing. CARDIOVASCULAR: The patient complains of some sharp, intermittent chest pains, intermittent palpitations. GASTROINTESTINAL: The patient denies any nausea, vomiting, abdominal pain or rectal bleeding.   PHYSICAL EXAMINATION:  GENERAL: This is a pleasant African American male who is not in any acute distress. He is alert and oriented x 3.  VITAL SIGNS: Temperature 97.9 degrees Fahrenheit, heart rate is 74, respiratory rate 18, blood pressure 182/81, oxygen saturation 96% on 2 L per minute nasal cannula.  HEENT: Head atraumatic, normocephalic. Eyes: Pupils are round and equal. There is no scleral icterus. Conjunctivae are pale, pink. Ears: The patient has hearing aids, but otherwise ears and nose normal to external inspection. Mouth: Poor dentition. Moist mucous membranes.  NECK: Supple with no JVD. There is no carotid bruits.  LUNGS: Clear to auscultation bilaterally  with no adventitious breath sounds. No accessory muscle use.  CARDIOVASCULAR: Regular rate and rhythm. No murmurs, rubs, or gallops appreciated.  ABDOMEN: Obese, soft and nontender to palpation.  EXTREMITIES: No cyanosis, clubbing, or edema.   ANCILLARY DATA: EKG on admission: Normal sinus rhythm, 68 beats per minute with occasional PVCs. Telemetry shows normal sinus rhythm in the 70s.   Chest x-ray  shows interstitial opacity similar to prior study.   LABORATORY DATA: Glucose is 75. BNP 376. BUN 21, creatinine 1.94, sodium was 139, potassium 3.7, chloride 107, CO2 27. Estimated GFR is 37. CK total is 81, CK-MB is 1.0, troponin I is less than 0.02 (repeated on 2 occasions). White blood cell count 13.1, hemoglobin 12.7, hematocrit 37.9, platelet count 174,000.   ASSESSMENT/PLAN: Chest pain. The patient has had some sharp, intermittent chest pain recently and  does have known coronary artery disease.  We have been treating the patient medically and recently adjusted his medications by increasing his isosorbide and adding Ranexa. There are no acute EKG changes today and troponins are negative x 1. Continue to cycle his cardiac enzymes. He is currently chest pain free. Due to his chronic renal insufficiency, no cardiac catheterization is planned. He is to follow up in our office for echocardiogram and nuclear stress testing. Due to the fact that some of his pain is localized to his shoulder, we will also get shoulder x-ray since the pain is often precipitated by lying on that side.   Thank you very much for this consultation and allowing [place] to participate in this patient's care.  ____________________________ Merla Riches, PA-C mam:aw D: 02/16/2013 08:55:06 ET T: 02/16/2013 09:12:36 ET JOB#: [id number]  cc: Merla Riches, PA-C, <Dictator> [name] PA ELECTRONICALLY SIGNED 02/17/2013 13:19

## 2014-11-18 NOTE — Discharge Summary (Signed)
PATIENT NAME:  Jacob Silva, Jacob Silva MR#:  [id number] DATE OF BIRTH:  1931-12-22  DATE OF ADMISSION:  02/16/2013 DATE OF DISCHARGE:  02/16/2013  DATE OF DICTATION: 02/17/2013  PRIMARY CARE PHYSICIAN: Lorelee Market, MD.  PRIMARY CARDIOLOGIST: Neoma Laming, MD.  IMAGING STUDIES: Included: 1.  Chest x-ray, which showed no acute abnormalities.  2.  Left shoulder x-ray, which showed no dislocation or fracture.   CONSULTANTS: Neoma Laming, MD, of cardiology.   ADMITTING HISTORY AND PHYSICAL AND HOSPITAL COURSE: Please see detailed H and P dictated previously. In brief, a 81 year old male patient with past history of obstructive sleep apnea, CAD, CABG, presented to the hospital complaining of left shoulder, chest pain. The patient has had recurrent admissions for the same, has been following with Dr. Humphrey Rolls, was admitted to rule out acute coronary syndrome.   He had 3 sets of cardiac enzymes checked, which were normal. By the day of discharge, the patient did not have any further chest or shoulder pain. Had a shoulder x-ray done, which showed no acute abnormalities. The patient was seen by Dr. Humphrey Rolls, who suggested following up with him in his clinic and okay to be discharged home.   No changes in medications on the day of discharge.   DISCHARGE MEDICATIONS: Include:  1.  Omeprazole 20 mg orally once a day.  2.  Aspirin 325 mg orally once a day.  3.  Levothyroxine 50 mcg orally once a day.  4.  Insulin 35 units subcutaneous once a day at bedtime.  5.  Allopurinol 100 mg orally once a day.  6.  Lasix 40 mg oral once a day in the morning and 20 mg oral in the evening.  7.  Gabapentin 400 mg orally 2 times a day.  8.  Coreg 25 mg 2 tablets orally 2 times a day.  9.  Simvastatin 40 mg 1/2 tablet orally once a day.  10.  Ranexa 1000 mg orally 2 times a day.  11.  Hydralazine 50 mg orally 2 times a day.  12.  Isosorbide mononitrate 60 mg orally 2 times a day.   DISCHARGE INSTRUCTIONS: Low-sodium,  low-cholesterol, carbohydrate-controlled diet.   ACTIVITY: As tolerated.   FOLLOWUP: Dr. Humphrey Rolls of cardiology in 1 to 2 weeks.   Time spent on day of discharge in discharge activity was 35 minutes.    ____________________________ Leia Alf Latrail Pounders, MD srs:np D: 02/17/2013 13:34:00 ET T: 02/17/2013 16:16:17 ET JOB#: [id number]  cc: Dionisio David, MD Carrie Schoonmaker R. Turkessa Ostrom, MD, <Dictator>  Neita Carp MD ELECTRONICALLY SIGNED 02/18/2013 10:21

## 2014-11-18 NOTE — H&P (Signed)
PATIENT NAME:  Jacob Silva, COYLE MR#:  [id number] DATE OF BIRTH:  September 22, 1931  DATE OF ADMISSION:  02/16/2013  REFERRING PHYSICIAN:  Marta Antu, MD  PRIMARY CARE PHYSICIAN: Lorelee Market, MD  CHIEF COMPLAINT: Chest pain.   HISTORY OF PRESENT ILLNESS: This is a 81 year old male with significant past medical history of obstructive sleep apnea, coronary artery disease status post CABG and chronic kidney disease who presents with complaints of chest pain, but reports it as chest pressure in the left chest with gradual onset, has been at rest while he was sitting on a chair, which has been constant, which relieved when the patient received nitro, as well the patient received 325 of aspirin in the ED. The patient reports a few episodes of chest pain over the last few weeks where he has been seeing his cardiologist, Dr. Humphrey Rolls, where he was being planned to have a 2-D echo this Friday. Last cardiac cath the patient had before 2 years which did show no significant disease. The patient's EKG did not show any significant finding. The patient had troponin x 1, which was negative.  As well, this patient is known to have history of chronic kidney disease with baseline creatinine around 1.9; current creatinine is 1.92. The patient denies any nausea, vomiting, sweating, shortness of breath, palpitations, weakness and dizziness. As well, in the ED, the patient was found to have hypoglycemia with blood sugar in the 50s. The patient reports he did take his Lantus yesterday.  Family at bedside report the patient has been taking occasionally lower dose over the last few days as he had low blood sugar then as well. The patient did get orange juice where repeat fingerstick was at 112.  Hospitalist service was requested to admit the patient for further management and work-up of his chest pain.   PAST MEDICAL HISTORY: 1.  Obstructive sleep apnea, on CPAP at night.  2.  Coronary artery disease, status post CABG.  3.   Hypertension.  4.  Diabetes mellitus.  5.  Hypothyroidism.  6.  Anemia.   PAST SURGICAL HISTORY:  1.  CABG.  2.  Cholecystectomy.  3.  Back surgery. 4.  Cataract surgery.  5.  Right total knee replacement.   SOCIAL HISTORY: The patient is an ex-chronic smoker, quit many years ago. No history of alcohol or drug use.   FAMILY HISTORY: Significant for lung cancer and esophageal cancer in his brother and diabetes mellitus in his mother.   ALLERGIES: PENICILLIN.   HOME MEDICATIONS: 1.  Aspirin 325 mg oral daily.  2.  Imdur 60 mg p.o. b.i.d. 3.  Ranexa 1000 mg oral 2 times a day.  4.  Gabapentin 400 mg oral 2 times a day.  5.  Lantus 35 units sub-Q at bedtime.  6.  Allopurinol 100 mg oral daily.  7.  Simvastatin 20 mg oral at bedtime.  8.  Doxycycline 1 tablet oral 2 times a day.  9.  Coreg 25 mg p.o. b.i.d.  10.  Lasix 40 mg oral every morning and 20 mg in the evening.  11.  Omeprazole 20 mg oral daily.  12.  Levothyroxine 50 mcg oral daily.  13.  Hydralazine 50 mg p.o. b.i.d.   REVIEW OF SYSTEMS: GENERAL: The patient denies fever or chills, weight gain or weight loss. Complains of weakness and fatigue.  EYES: Denies blurry vision, double vision, inflammation, glaucoma.  ENT: Denies tinnitus, ear pain, epistaxis, discharge. RESPIRATORY: Denies any cough, wheezing, hemoptysis, painful respirations. Denies any dyspnea.  CARDIOVASCULAR: Complains of chest pain. Denies edema, arrhythmia, palpitations, syncope.  GASTROINTESTINAL: Denies nausea, vomiting, diarrhea, abdominal pain, hematemesis, melena.  GENITOURINARY: Denies dysuria, hematuria, renal colic.  ENDOCRINE: Denies polyuria, polydipsia, heat or cold intolerance.  HEMATOLOGY: Reports history of anemia. Denies easy bruising, bleeding diathesis.  INTEGUMENTARY: Denies acne, rash or skin lesions.  MUSCULOSKELETAL: Denies any cramps. Has history of gout and arthritis. NEUROLOGIC:  Denies CVA, TIA, dementia, epilepsy,  dysarthria, tremor.  PSYCHIATRIC: Denies anxiety, insomnia, bipolar disorder or schizophrenia.   PHYSICAL EXAMINATION: VITAL SIGNS:  Temperature 97.5, pulse 69, respiratory rate 20, blood pressure 163/80, saturating 94% on room air.  GENERAL: Elderly male who looks comfortable and in no apparent distress.  HEENT: Head atraumatic, normocephalic. Pupils are equal and reactive to light. Pink conjunctivae. Anicteric sclerae. Moist oral mucosa.  NECK: Supple. No thyromegaly. No JVD.  CHEST: Good air entry bilaterally with mild basilar crackles. No wheezing, cough or rhonchi.  CARDIOVASCULAR: S1 and S2 heard. No rubs, murmurs, or gallops.  ABDOMEN: Soft, nontender, nondistended. Bowel sounds present. Obese.  EXTREMITIES: No edema. No clubbing. No cyanosis. Pedal pulses felt bilaterally.  SKIN: Normal skin turgor. Warm and dry.  PSYCHIATRIC: Appropriate affect. Awake and alert x 3. Intact judgment and insight.  NEUROLOGIC: Cranial nerves grossly intact. No focal motor deficits.  LYMPH:  No cervical lymphadenopathy could be appreciated.   PERTINENT LABORATORY AND DIAGNOSTICS:  Glucose 54. BNP 367. BUN 21, creatinine 1.94, sodium 139, potassium 3.7, CO2 27. Troponin less than 0.02. White blood cells 15.1, hemoglobin 12.7, hematocrit 37.9, platelets 174.   EKG is showing normal sinus rhythm at 68 beats per minute with occasional PVCs. No change from previous EKG   ASSESSMENT AND PLAN: 1.  Chest pain. The patient does have known history of coronary artery disease.  His chest pain resolved by nitro.  It happened at rest, so the patient will be admitted to telemetry unit for further work-up. We will continue to cycle his cardiac enzymes.  He already received 325 of aspirin and he is on nitro paste. We will consult cardiology, Dr. Neoma Laming, who is very familiar with the patient to see what kind of work up is indicated at this point.  2.  Chronic kidney disease. Appears to be stable at baseline. We will  hold his Lasix. We will have him on gentle hydration as unsure if the patient will go for any procedures.  3.  Hypoglycemia. Currently improved after the patient was given orange juice. We will monitor her fingersticks closely and will decrease his Lantus from 35 to 20, and if blood sugar becomes persistently acceptable then we can add insulin sliding scale.  4.  Obstructive sleep apnea.  Will continue on CPAP.  5.  Coronary artery disease. The patient is on aspirin, statin, beta blockers.  6.  Diabetes mellitus. We will lower his Lantus secondary to his hypoglycemia.  7.  Hypothyroidism. Continue with Synthroid.  8. Anemia, stable.  9.  Deep vein thrombosis prophylaxis. Subcutaneous heparin.  10.  Gastrointestinal prophylaxis. On proton pump inhibitors.   CODE STATUS: FULL CODE.   TOTAL TIME SPENT ON ADMISSION AND PATIENT CARE: 55 minutes.  ____________________________ Albertine Patricia, MD dse:sb D: 02/16/2013 05:47:31 ET T: 02/16/2013 07:30:14 ET JOB#: [id number]  cc: Albertine Patricia, MD, <Dictator> Nikiya Starn Graciela Husbands MD ELECTRONICALLY SIGNED 02/26/2013 7:36

## 2015-01-23 ENCOUNTER — Ambulatory Visit (INDEPENDENT_AMBULATORY_CARE_PROVIDER_SITE_OTHER): Payer: Medicare Other | Admitting: Podiatry

## 2015-01-23 DIAGNOSIS — M79673 Pain in unspecified foot: Secondary | ICD-10-CM | POA: Diagnosis not present

## 2015-01-23 DIAGNOSIS — B351 Tinea unguium: Secondary | ICD-10-CM | POA: Diagnosis not present

## 2015-01-23 LAB — HM DIABETES FOOT EXAM

## 2015-01-23 NOTE — Progress Notes (Signed)
He presents today chief complaint of painfully elongated toenails one through 5 bilateral.  Objective: Vital signs are stable he is alert and oriented x3. Pulses are palpable bilateral. Capillary fill time to digits one through 5 is noted to be immediate. Neurologic sensorium is intact per since once the monofilament. Nails are thick yellow dystrophic onychomycotic and painful palpation 1 through 5 bilateral.  Assessment: Pain in limb secondary to onychomycosis 1 through 5 bilateral.  Plan: Debridement of nails 1 through 5 bilateral covered service secondary to pain

## 2015-02-06 ENCOUNTER — Telehealth: Payer: Self-pay | Admitting: Podiatry

## 2015-02-06 NOTE — Telephone Encounter (Signed)
Patient's wife called saying PCP stated he has signed off on paperwork for pt to get diabetic shoes. Wants to know if we have received authorization and what the next step is.

## 2015-02-27 ENCOUNTER — Encounter: Payer: Self-pay | Admitting: Podiatry

## 2015-02-27 ENCOUNTER — Ambulatory Visit (INDEPENDENT_AMBULATORY_CARE_PROVIDER_SITE_OTHER): Payer: Medicare Other | Admitting: Podiatry

## 2015-02-27 DIAGNOSIS — M2040 Other hammer toe(s) (acquired), unspecified foot: Secondary | ICD-10-CM

## 2015-02-27 DIAGNOSIS — E1142 Type 2 diabetes mellitus with diabetic polyneuropathy: Secondary | ICD-10-CM

## 2015-02-27 NOTE — Progress Notes (Signed)
Measured for diabetic shoes and insoles. Measures a 12.5 m , custom inserts sent to [facility]. New balance shoe 813 lace

## 2015-02-27 NOTE — Patient Instructions (Signed)
Our office will notify you once your diabetic shoes and insoles arrive. At that time an appointment will be needed to pick them up.  

## 2015-03-27 ENCOUNTER — Ambulatory Visit (INDEPENDENT_AMBULATORY_CARE_PROVIDER_SITE_OTHER): Payer: Medicare Other | Admitting: Podiatry

## 2015-03-27 ENCOUNTER — Encounter: Payer: Self-pay | Admitting: Podiatry

## 2015-03-27 DIAGNOSIS — E1140 Type 2 diabetes mellitus with diabetic neuropathy, unspecified: Secondary | ICD-10-CM

## 2015-03-27 DIAGNOSIS — M779 Enthesopathy, unspecified: Secondary | ICD-10-CM

## 2015-03-27 DIAGNOSIS — L84 Corns and callosities: Secondary | ICD-10-CM | POA: Diagnosis not present

## 2015-03-27 DIAGNOSIS — M2041 Other hammer toe(s) (acquired), right foot: Secondary | ICD-10-CM | POA: Diagnosis not present

## 2015-03-27 DIAGNOSIS — M2040 Other hammer toe(s) (acquired), unspecified foot: Secondary | ICD-10-CM

## 2015-03-27 DIAGNOSIS — M2042 Other hammer toe(s) (acquired), left foot: Secondary | ICD-10-CM

## 2015-03-27 DIAGNOSIS — E1142 Type 2 diabetes mellitus with diabetic polyneuropathy: Secondary | ICD-10-CM

## 2015-03-27 NOTE — Patient Instructions (Signed)

## 2015-03-27 NOTE — Progress Notes (Signed)
Mr. Jacob Silva presents today for pickup his diabetic shoes. He was given both oral and written home Silva instructions for care and use of the shoes. They appear to fit him well he will notify us in 1 month and make an appointment for reevaluation.

## 2015-04-24 ENCOUNTER — Ambulatory Visit (INDEPENDENT_AMBULATORY_CARE_PROVIDER_SITE_OTHER): Payer: Medicare Other | Admitting: Podiatry

## 2015-04-24 ENCOUNTER — Encounter: Payer: Self-pay | Admitting: Podiatry

## 2015-04-24 DIAGNOSIS — M79676 Pain in unspecified toe(s): Secondary | ICD-10-CM | POA: Diagnosis not present

## 2015-04-24 DIAGNOSIS — E1142 Type 2 diabetes mellitus with diabetic polyneuropathy: Secondary | ICD-10-CM | POA: Diagnosis not present

## 2015-04-24 DIAGNOSIS — B351 Tinea unguium: Secondary | ICD-10-CM

## 2015-04-24 NOTE — Progress Notes (Signed)
He presents today with a chief complaint of painful elongated toenails 1 through 5 bilateral.  Objective: Vital signs are stable he is alert and oriented 3. Pulses are strongly palpable. His nails are thick yellow dystrophic with mycotic painful palpation. No ulcerations no lesions plantar aspect of the foot.  Assessment: Pain in limb secondary to onychomycosis 1 through 5 bilateral.  Plan: Debridement of nails in thickness and length as a covered service secondary to pain and diabetes.  Todd high DPM

## 2015-04-27 ENCOUNTER — Ambulatory Visit: Payer: Medicare Other

## 2015-06-08 DIAGNOSIS — Z96659 Presence of unspecified artificial knee joint: Secondary | ICD-10-CM | POA: Insufficient documentation

## 2015-06-08 DIAGNOSIS — M48061 Spinal stenosis, lumbar region without neurogenic claudication: Secondary | ICD-10-CM | POA: Insufficient documentation

## 2015-06-13 ENCOUNTER — Other Ambulatory Visit: Payer: Self-pay | Admitting: Specialist

## 2015-06-13 DIAGNOSIS — M48061 Spinal stenosis, lumbar region without neurogenic claudication: Secondary | ICD-10-CM

## 2015-06-29 ENCOUNTER — Ambulatory Visit
Admission: RE | Admit: 2015-06-29 | Discharge: 2015-06-29 | Disposition: A | Discharge status: Home / Self Care | Payer: Medicare Other | Source: Ambulatory Visit | Attending: Specialist | Admitting: Specialist

## 2015-06-29 DIAGNOSIS — M48061 Spinal stenosis, lumbar region without neurogenic claudication: Secondary | ICD-10-CM

## 2015-06-29 DIAGNOSIS — M4806 Spinal stenosis, lumbar region: Secondary | ICD-10-CM | POA: Diagnosis not present

## 2015-06-29 DIAGNOSIS — M5137 Other intervertebral disc degeneration, lumbosacral region: Secondary | ICD-10-CM | POA: Diagnosis not present

## 2015-07-26 ENCOUNTER — Encounter: Payer: Self-pay | Admitting: Podiatry

## 2015-07-26 ENCOUNTER — Ambulatory Visit (INDEPENDENT_AMBULATORY_CARE_PROVIDER_SITE_OTHER): Payer: Medicare Other | Admitting: Podiatry

## 2015-07-26 DIAGNOSIS — B351 Tinea unguium: Secondary | ICD-10-CM | POA: Diagnosis not present

## 2015-07-26 DIAGNOSIS — E1142 Type 2 diabetes mellitus with diabetic polyneuropathy: Secondary | ICD-10-CM | POA: Diagnosis not present

## 2015-07-26 DIAGNOSIS — M79676 Pain in unspecified toe(s): Secondary | ICD-10-CM

## 2015-07-26 NOTE — Progress Notes (Signed)
He presents today with a chief complaint of painful elongated toenails.  Objective: Vital signs are stable he is alert and oriented 3. Pulses are strongly palpable. Neurologic sensorium is intact percent ostial monofilament. Toenails are thick yellow dystrophic with mycotic and painful palpation.  Assessment: Pain in limb secondary to onychomycosis 1 through 5 bilateral.  Plan: Debridement of toenails 1 through 5 bilateral covered service secondary to pain. Follow up with him 3 months.

## 2015-10-25 ENCOUNTER — Ambulatory Visit (INDEPENDENT_AMBULATORY_CARE_PROVIDER_SITE_OTHER): Payer: Medicare Other | Admitting: Podiatry

## 2015-10-25 ENCOUNTER — Encounter: Payer: Self-pay | Admitting: Podiatry

## 2015-10-25 DIAGNOSIS — M722 Plantar fascial fibromatosis: Secondary | ICD-10-CM | POA: Diagnosis not present

## 2015-10-25 DIAGNOSIS — E1142 Type 2 diabetes mellitus with diabetic polyneuropathy: Secondary | ICD-10-CM | POA: Diagnosis not present

## 2015-10-25 DIAGNOSIS — B351 Tinea unguium: Secondary | ICD-10-CM

## 2015-10-25 DIAGNOSIS — M79676 Pain in unspecified toe(s): Secondary | ICD-10-CM

## 2015-10-25 NOTE — Progress Notes (Signed)
He presents today with a chief complaint of painful elongated toenails.  Objective: Vital signs are stable alert and oriented 3 pulses are palpable. Toenails are thick yellow dystrophic with mycotic and painful palpation. He is also complaining of a painful left heel which has pain on palpation medial calcaneal tubercle. No pain on medial and lateral compression of the calcaneus.  Assessment: Pain in limb secondary to onychomycosis 1 through 5 bilateral. Plantar fasciitis.  Plan: Debridement of toenails 1 through 5 bilateral. I injected his left heel today with Kenalog and local anesthetic.

## 2016-01-24 ENCOUNTER — Ambulatory Visit (INDEPENDENT_AMBULATORY_CARE_PROVIDER_SITE_OTHER): Payer: Medicare Other | Admitting: Podiatry

## 2016-01-24 ENCOUNTER — Encounter: Payer: Self-pay | Admitting: Podiatry

## 2016-01-24 DIAGNOSIS — M79676 Pain in unspecified toe(s): Secondary | ICD-10-CM

## 2016-01-24 DIAGNOSIS — M722 Plantar fascial fibromatosis: Secondary | ICD-10-CM

## 2016-01-24 DIAGNOSIS — B351 Tinea unguium: Secondary | ICD-10-CM | POA: Diagnosis not present

## 2016-01-24 DIAGNOSIS — E1142 Type 2 diabetes mellitus with diabetic polyneuropathy: Secondary | ICD-10-CM | POA: Diagnosis not present

## 2016-01-24 NOTE — Progress Notes (Signed)
He presents today with a chief complaint of painful elongated toenails as well as recurrence of his plantar fasciitis in his left heel.  Objective: Vital signs are stable alert and oriented 3 pulses are palpable. His toenails are thick yellow dystrophic onychomycotic and painful on palpation. He has pain on palpation medial calcaneal tubercle of the left heel with no calf pain.  Assessment: Pain in limb secondary to onychomycosis and plantar fasciitis left foot.  Plan: I injected left heel today with Kenalog and local anesthetic and debrided his nails 1 through 5 bilateral.

## 2016-04-17 ENCOUNTER — Ambulatory Visit: Payer: Medicare Other | Admitting: Podiatry

## 2016-05-01 ENCOUNTER — Encounter: Payer: Self-pay | Admitting: Podiatry

## 2016-05-01 ENCOUNTER — Ambulatory Visit (INDEPENDENT_AMBULATORY_CARE_PROVIDER_SITE_OTHER): Payer: Medicare Other | Admitting: Podiatry

## 2016-05-01 DIAGNOSIS — B351 Tinea unguium: Secondary | ICD-10-CM

## 2016-05-01 DIAGNOSIS — M79676 Pain in unspecified toe(s): Secondary | ICD-10-CM

## 2016-05-01 DIAGNOSIS — M722 Plantar fascial fibromatosis: Secondary | ICD-10-CM

## 2016-05-01 NOTE — Progress Notes (Signed)
Jacob Silva presents today for follow-up of his plantar fasciitis in his left heel he which he states is doing much better. But he would like to have his nails cut.  Objective: Vital signs are stable he is alert and oriented 3. Pulses are palpable. He has no pain on palpation medial calcaneal tubercle of the left heel. His toenails are thick yellow dystrophic onychomycotic and painful on palpation. There are no open wounds or lesions.  Assessment: Well healing plantar fasciitis. Pain in limb secondary to onychomycosis.  Plan: Debridement of all reactive hyperkeratoses and debridement of all toenails 1 through 5 bilateral. Follow up with him in 3 months call sooner if needed

## 2016-08-05 ENCOUNTER — Ambulatory Visit: Payer: Medicare Other | Admitting: Podiatry

## 2016-08-07 ENCOUNTER — Ambulatory Visit: Payer: Medicare Other | Admitting: Podiatry

## 2016-08-12 ENCOUNTER — Encounter: Payer: Self-pay | Admitting: Podiatry

## 2016-08-12 ENCOUNTER — Ambulatory Visit (INDEPENDENT_AMBULATORY_CARE_PROVIDER_SITE_OTHER): Payer: Medicare Other | Admitting: Podiatry

## 2016-08-12 DIAGNOSIS — M79676 Pain in unspecified toe(s): Secondary | ICD-10-CM

## 2016-08-12 DIAGNOSIS — B351 Tinea unguium: Secondary | ICD-10-CM

## 2016-08-12 NOTE — Progress Notes (Signed)
He presents today with a chief complaint painful elongated toenails. He states that his diabetes is good and he denies any open lesions or wounds.  Objective: Vital signs are stable alert and oriented 3 pulses are palpable. Neurologic sensorium is intact. Deep tendon reflexes are intact. Muscle strength +5 over 5. Toenails are thick yellow dystrophic with mycotic no open lesions or wounds are visible.  Assessment: Pain and limp secondary to onychomycosis 1 through 5 bilateral.  Plan: Debridement of toenails 1 through 5 bilateral. Follow up with him in about 3 months

## 2016-11-11 ENCOUNTER — Encounter: Payer: Self-pay | Admitting: Podiatry

## 2016-11-11 ENCOUNTER — Ambulatory Visit (INDEPENDENT_AMBULATORY_CARE_PROVIDER_SITE_OTHER): Payer: Medicare Other | Admitting: Podiatry

## 2016-11-11 DIAGNOSIS — B351 Tinea unguium: Secondary | ICD-10-CM

## 2016-11-11 DIAGNOSIS — M79676 Pain in unspecified toe(s): Secondary | ICD-10-CM

## 2016-11-11 DIAGNOSIS — E1142 Type 2 diabetes mellitus with diabetic polyneuropathy: Secondary | ICD-10-CM

## 2016-11-11 NOTE — Progress Notes (Signed)
Complaint:  Visit Type: Patient returns to my office for continued preventative foot care services. Complaint: Patient states" my nails have grown long and thick and become painful to walk and wear shoes" Patient has been diagnosed with DM with no foot complications. The patient presents for preventative foot care services. No changes to ROS  Podiatric Exam: Vascular: dorsalis pedis and posterior tibial pulses are palpable bilateral. Capillary return is immediate. Temperature gradient is WNL. Skin turgor WNL  Sensorium: Normal Semmes Weinstein monofilament test right foot.  Absent LOPS left foot.. Normal tactile sensation bilaterally. Nail Exam: Pt has thick disfigured discolored nails with subungual debris noted bilateral entire nail hallux through fifth toenails Ulcer Exam: There is no evidence of ulcer or pre-ulcerative changes or infection. Orthopedic Exam: Muscle tone and strength are WNL. No limitations in general ROM. No crepitus or effusions noted. Foot type and digits show no abnormalities. Bony prominences are unremarkable. Skin: No Porokeratosis. No infection or ulcers  Diagnosis:  Onychomycosis, , Pain in right toe, pain in left toes  Treatment & Plan Procedures and Treatment: Consent by patient was obtained for treatment procedures. The patient understood the discussion of treatment and procedures well. All questions were answered thoroughly reviewed. Debridement of mycotic and hypertrophic toenails, 1 through 5 bilateral and clearing of subungual debris. No ulceration, no infection noted.  Return Visit-Office Procedure: Patient instructed to return to the office for a follow up visit 3 months for continued evaluation and treatment.    Gardiner Barefoot DPM

## 2017-02-10 ENCOUNTER — Encounter: Payer: Self-pay | Admitting: Podiatry

## 2017-02-10 ENCOUNTER — Ambulatory Visit (INDEPENDENT_AMBULATORY_CARE_PROVIDER_SITE_OTHER): Payer: Medicare Other | Admitting: Podiatry

## 2017-02-10 DIAGNOSIS — M79676 Pain in unspecified toe(s): Secondary | ICD-10-CM | POA: Diagnosis not present

## 2017-02-10 DIAGNOSIS — E1142 Type 2 diabetes mellitus with diabetic polyneuropathy: Secondary | ICD-10-CM

## 2017-02-10 DIAGNOSIS — B351 Tinea unguium: Secondary | ICD-10-CM

## 2017-02-10 NOTE — Progress Notes (Signed)
Complaint:  Visit Type: Patient returns to my office for continued preventative foot care services. Complaint: Patient states" my nails have grown long and thick and become painful to walk and wear shoes" Patient has been diagnosed with DM with no foot complications. The patient presents for preventative foot care services. No changes to ROS  Podiatric Exam: Vascular: dorsalis pedis and posterior tibial pulses are palpable bilateral. Capillary return is immediate. Temperature gradient is WNL. Skin turgor WNL  Sensorium: Normal Semmes Weinstein monofilament test right foot.  Absent LOPS left foot.. Normal tactile sensation bilaterally. Nail Exam: Pt has thick disfigured discolored nails with subungual debris noted bilateral entire nail second  through fifth toenails Ulcer Exam: There is no evidence of ulcer or pre-ulcerative changes or infection. Orthopedic Exam: Muscle tone and strength are WNL. No limitations in general ROM. No crepitus or effusions noted. Foot type and digits show no abnormalities. Bony prominences are unremarkable. Skin: No Porokeratosis. No infection or ulcers  Diagnosis:  Onychomycosis, , Pain in right toe, pain in left toes  Treatment & Plan Procedures and Treatment: Consent by patient was obtained for treatment procedures. The patient understood the discussion of treatment and procedures well. All questions were answered thoroughly reviewed. Debridement of mycotic and hypertrophic toenails, 1 through 5 bilateral and clearing of subungual debris. No ulceration, no infection noted.  Return Visit-Office Procedure: Patient instructed to return to the office for a follow up visit 3 months for continued evaluation and treatment.    Cesily Cuoco DPM 

## 2017-02-19 ENCOUNTER — Ambulatory Visit (INDEPENDENT_AMBULATORY_CARE_PROVIDER_SITE_OTHER): Payer: Medicare Other | Admitting: Podiatry

## 2017-02-19 ENCOUNTER — Telehealth: Payer: Self-pay | Admitting: Orthotics

## 2017-02-19 DIAGNOSIS — E1142 Type 2 diabetes mellitus with diabetic polyneuropathy: Secondary | ICD-10-CM | POA: Diagnosis not present

## 2017-02-19 DIAGNOSIS — M722 Plantar fascial fibromatosis: Secondary | ICD-10-CM | POA: Diagnosis not present

## 2017-02-19 DIAGNOSIS — M2040 Other hammer toe(s) (acquired), unspecified foot: Secondary | ICD-10-CM | POA: Diagnosis not present

## 2017-02-19 DIAGNOSIS — M2011 Hallux valgus (acquired), right foot: Secondary | ICD-10-CM | POA: Diagnosis not present

## 2017-02-19 DIAGNOSIS — M2012 Hallux valgus (acquired), left foot: Secondary | ICD-10-CM | POA: Diagnosis not present

## 2017-02-19 NOTE — Progress Notes (Signed)
  Patient came in today for fitting and eval for diabetic shoes: Patient' doctor here is Prudence Davidson  PCP is Waylan Rocher  Patient presents with DM2, HAV, hammertoes  Patient was measured with brannock device and cast in foam for custom inserts.  Patient chose S225-2   13W   Gardiner Barefoot [facility]

## 2017-03-19 ENCOUNTER — Ambulatory Visit: Payer: Medicare Other | Admitting: Orthotics

## 2017-03-19 DIAGNOSIS — E1142 Type 2 diabetes mellitus with diabetic polyneuropathy: Secondary | ICD-10-CM

## 2017-03-19 NOTE — Progress Notes (Signed)
Gave patient paperwork to take to PCP.

## 2017-04-09 NOTE — Telephone Encounter (Signed)
Talked to patient regarding f/o

## 2017-05-12 ENCOUNTER — Encounter: Payer: Self-pay | Admitting: Podiatry

## 2017-05-12 ENCOUNTER — Ambulatory Visit (INDEPENDENT_AMBULATORY_CARE_PROVIDER_SITE_OTHER): Payer: Medicare Other | Admitting: Podiatry

## 2017-05-12 DIAGNOSIS — M79676 Pain in unspecified toe(s): Secondary | ICD-10-CM | POA: Diagnosis not present

## 2017-05-12 DIAGNOSIS — M722 Plantar fascial fibromatosis: Secondary | ICD-10-CM | POA: Diagnosis not present

## 2017-05-12 DIAGNOSIS — E1142 Type 2 diabetes mellitus with diabetic polyneuropathy: Secondary | ICD-10-CM | POA: Diagnosis not present

## 2017-05-12 DIAGNOSIS — B351 Tinea unguium: Secondary | ICD-10-CM | POA: Diagnosis not present

## 2017-05-12 NOTE — Progress Notes (Signed)
Complaint:  Visit Type: Patient returns to my office for continued preventative foot care services. Complaint: Patient states" my nails have grown long and thick and become painful to walk and wear shoes" Patient has been diagnosed with DM with no foot complications. The patient presents for preventative foot care services. No changes to ROS.  He says that he is now experiencing pain in his right heel.  He says that he experiences pain on the bottom of his foot that is painful first thing in the morning upon rising.  He states this is then painful through the day.  He also relates pain in the back of his heel that wakes him up at night and is painful when he rests his foot on his chair.  He presents the office today for an evaluation of his feet and treatment of his heel pain.  Podiatric Exam: Vascular: dorsalis pedis and posterior tibial pulses are palpable bilateral. Capillary return is immediate. Temperature gradient is WNL. Skin turgor WNL  Sensorium: Normal Semmes Weinstein monofilament test right foot.  Absent LOPS left foot.. Normal tactile sensation bilaterally. Nail Exam: Pt has thick disfigured discolored nails with subungual debris noted bilateral entire nail second  through fifth toenails Ulcer Exam: There is no evidence of ulcer or pre-ulcerative changes or infection. Orthopedic Exam: Muscle tone and strength are WNL. No limitations in general ROM. No crepitus or effusions noted. Foot type and digits show no abnormalities. Bony prominences are unremarkable. Palpable pain noted at the insertion of the plantar fascia of the right heel.  Pain is noted at the insertion of the Achilles tendon on the right heel with no evidence of any swelling or pain upon motion of the right heel. Skin: No Porokeratosis. No infection or ulcers  Diagnosis:  Onychomycosis, , Pain in right toe, pain in left toes Plantar fascitis right heel.    Treatment & Plan Procedures and Treatment: Consent by patient was  obtained for treatment procedures. The patient understood the discussion of treatment and procedures well. All questions were answered thoroughly reviewed. Debridement of mycotic and hypertrophic toenails, 1 through 5 bilateral and clearing of subungual debris. No ulceration, no infection noted. Injection therapy including Xylocaine, Kenalog-la and dexamethasone phosphate was administered.  Patient was also instructed to rest his foot on a pillow and rest his leg on a pillow, so his heel is not touching the bed at night.  To consider an padding for his heel. if pain persists.   Return Visit-Office Procedure: Patient instructed to return to the office for a follow up visit 3 months for continued evaluation and treatment.    Gardiner Barefoot DPM

## 2017-07-21 ENCOUNTER — Other Ambulatory Visit: Payer: Self-pay

## 2017-07-21 ENCOUNTER — Emergency Department
Admission: EM | Admit: 2017-07-21 | Discharge: 2017-07-21 | Disposition: A | Discharge status: Home / Self Care | Payer: Medicare Other | Attending: Emergency Medicine | Admitting: Emergency Medicine

## 2017-07-21 ENCOUNTER — Emergency Department: Payer: Medicare Other

## 2017-07-21 ENCOUNTER — Encounter: Payer: Self-pay | Admitting: Emergency Medicine

## 2017-07-21 DIAGNOSIS — Z7982 Long term (current) use of aspirin: Secondary | ICD-10-CM | POA: Insufficient documentation

## 2017-07-21 DIAGNOSIS — Z7902 Long term (current) use of antithrombotics/antiplatelets: Secondary | ICD-10-CM | POA: Insufficient documentation

## 2017-07-21 DIAGNOSIS — N183 Chronic kidney disease, stage 3 (moderate): Secondary | ICD-10-CM | POA: Diagnosis not present

## 2017-07-21 DIAGNOSIS — Z79899 Other long term (current) drug therapy: Secondary | ICD-10-CM | POA: Insufficient documentation

## 2017-07-21 DIAGNOSIS — S99921A Unspecified injury of right foot, initial encounter: Secondary | ICD-10-CM | POA: Diagnosis present

## 2017-07-21 DIAGNOSIS — Y9301 Activity, walking, marching and hiking: Secondary | ICD-10-CM | POA: Diagnosis not present

## 2017-07-21 DIAGNOSIS — Y999 Unspecified external cause status: Secondary | ICD-10-CM | POA: Insufficient documentation

## 2017-07-21 DIAGNOSIS — X501XXA Overexertion from prolonged static or awkward postures, initial encounter: Secondary | ICD-10-CM | POA: Insufficient documentation

## 2017-07-21 DIAGNOSIS — E039 Hypothyroidism, unspecified: Secondary | ICD-10-CM | POA: Insufficient documentation

## 2017-07-21 DIAGNOSIS — I129 Hypertensive chronic kidney disease with stage 1 through stage 4 chronic kidney disease, or unspecified chronic kidney disease: Secondary | ICD-10-CM | POA: Insufficient documentation

## 2017-07-21 DIAGNOSIS — S92351A Displaced fracture of fifth metatarsal bone, right foot, initial encounter for closed fracture: Secondary | ICD-10-CM | POA: Diagnosis not present

## 2017-07-21 DIAGNOSIS — E1122 Type 2 diabetes mellitus with diabetic chronic kidney disease: Secondary | ICD-10-CM | POA: Insufficient documentation

## 2017-07-21 DIAGNOSIS — Y929 Unspecified place or not applicable: Secondary | ICD-10-CM | POA: Diagnosis not present

## 2017-07-21 DIAGNOSIS — Z794 Long term (current) use of insulin: Secondary | ICD-10-CM | POA: Diagnosis not present

## 2017-07-21 DIAGNOSIS — S92341A Displaced fracture of fourth metatarsal bone, right foot, initial encounter for closed fracture: Secondary | ICD-10-CM | POA: Diagnosis not present

## 2017-07-21 MED ORDER — TRAMADOL HCL 50 MG PO TABS: 50.0000 mg | ORAL_TABLET | Freq: Four times a day (QID) | ORAL | 0 refills | Status: DC | PRN

## 2017-07-21 MED ORDER — TRAMADOL HCL 50 MG PO TABS
ORAL_TABLET | ORAL | Status: AC
  Filled 2017-07-21: qty 1

## 2017-07-21 MED ORDER — TRAMADOL HCL 50 MG PO TABS
50.0000 mg | ORAL_TABLET | Freq: Once | ORAL | Status: AC
  Administered 2017-07-21: 50 mg via ORAL

## 2017-07-21 NOTE — ED Triage Notes (Addendum)
See first nurse note. Swelling to right ankle/foot noted

## 2017-07-21 NOTE — ED Triage Notes (Signed)
FIRST NURSE NOTE- stepped wrong on uneven ground 1 week ago and right foot turned. Still having pain to right foot. Ambulatory to check in desk. Given wheelchair.

## 2017-07-21 NOTE — ED Provider Notes (Signed)
[facility] Emergency Department Provider Note  ____________________________________________   First MD Initiated Contact with Patient 07/21/17 1049     (approximate)  I have reviewed the triage vital signs and the nursing notes.   HISTORY  Chief Complaint Foot Pain   HPI Jacob Silva is a 83 y.o. male is here with complaint of right foot pain. Patient states that one week ago he stepped on some uneven pavement/ground injuring his foot. He states that he did not fall as a few people kept him from actually falling and he did not hit his head or loss of consciousness. Patient has continued to alternate with ice and heat to his foot and today he is unable to sleep due to the pain. Pain is increased with weightbearing. E rates his pain as an 8 out of 10.   Past Medical History:  Diagnosis Date  . Diabetes ([place])   . HBP (high blood pressure)     Patient Active Problem List   Diagnosis Date Noted  . Arteriosclerosis of coronary artery 11/14/2013  . Chronic kidney disease (CKD), stage III (moderate) ([place]) 11/14/2013  . Type 2 diabetes mellitus ([place]) 11/14/2013  . Fatigue 11/14/2013  . Gout 11/14/2013  . 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 BP (high blood pressure) 11/14/2013  . Hypercholesterolemia 11/14/2013  . Adult hypothyroidism 11/14/2013  . Arthritis, degenerative 11/14/2013  . Anemia, iron deficiency 11/11/2013  . Heart trouble 12/21/2010  . Arthritis 12/21/2010    Past Surgical History:  Procedure Laterality Date  . BACK SURGERY    . HERNIA REPAIR    . KNEE SURGERY Right   . TRIPLE BYPASS      Prior to Admission medications   Medication Sig Start Date End Date Taking? Authorizing Provider  albuterol (PROAIR HFA) 108 (90 BASE) MCG/ACT inhaler  10/12/13   [provider]  aspirin 325 MG tablet Take 325 mg by mouth daily.    [provider]  BD PEN NEEDLE NANO U/F 32G X 4 MM MISC  10/07/15   [provider]    Blood Glucose Monitoring Suppl (FREESTYLE LITE) DEVI 2 (two) times daily. for testing 02/16/15   [provider]  butalbital-acetaminophen-caffeine (FIORICET, ESGIC) 50-325-40 MG per tablet Take by mouth. 09/16/13   [provider]  carvedilol (COREG) 25 MG tablet Take by mouth.    [provider]  clopidogrel (PLAVIX) 75 MG tablet Take by mouth.    [provider]  FREESTYLE LITE test strip TEST AS DIRECTED TWICE A DAY 02/16/15   [provider]  furosemide (LASIX) 40 MG tablet Take by mouth.    [provider]  gabapentin (NEURONTIN) 400 MG capsule Take by mouth.    [provider]  hydrALAZINE (APRESOLINE) 50 MG tablet Take by mouth.    [provider]  insulin aspart (NOVOLOG) 100 UNIT/ML SOCT cartridge Inject 30 Units into the skin once.    [provider]  isosorbide mononitrate (IMDUR) 60 MG 24 hr tablet  10/16/15   [provider]  Lancets (FREESTYLE) lancets TEST AS DIRECTED TWICE A DAY 02/16/15   [provider]  LANTUS SOLOSTAR 100 UNIT/ML Solostar Pen  10/07/15   [provider]  levothyroxine (SYNTHROID, LEVOTHROID) 50 MCG tablet Take by mouth.    [provider]  lisinopril (PRINIVIL,ZESTRIL) 40 MG tablet Take by mouth.    [provider]  Multiple Vitamin (MULTI-VITAMINS) TABS Take by mouth.    [provider]  nitroGLYCERIN (  NITROSTAT) 0.4 MG SL tablet Place under the tongue. 09/04/13   [provider]  Omeprazole 20 MG TBEC Take by mouth.    [provider]  ranolazine (RANEXA) 1000 MG SR tablet Take by mouth.    [provider]  simvastatin (ZOCOR) 80 MG tablet Take by mouth.    [provider]  traMADol (ULTRAM) 50 MG tablet Take 1 tablet (50 mg total) by mouth every 6 (six) hours as needed. 07/21/17   Johnn Hai, PA-C  Vitamin D, Ergocalciferol, (DRISDOL) 50000 UNITS CAPS capsule Take by mouth.    [provider]    Allergies Penicillins  History reviewed. No pertinent family history.  Social History Social History   Tobacco Use  . Smoking status: Never Smoker  . Smokeless tobacco: Never Used  Substance Use Topics  . Alcohol use: No  . Drug use: No    Review of Systems Constitutional: No fever/chills ENT: patient is hard of hearing. Cardiovascular: Denies chest pain. Respiratory: Denies shortness of breath. Genitourinary: Negative for dysuria. Musculoskeletal: positive for right foot pain. Skin: Negative for rash. Neurological: Negative for headaches, focal weakness or numbness. ____________________________________________   PHYSICAL EXAM:  VITAL SIGNS: ED Triage Vitals  Enc Vitals Group     BP 07/21/17 1011 (!) 145/60     Pulse Rate 07/21/17 1011 72     Resp 07/21/17 1011 18     Temp 07/21/17 1011 97.7 F (36.5 C)     Temp Source 07/21/17 1011 Oral     SpO2 07/21/17 1011 96 %     Weight 07/21/17 1007 237 lb (107.5 kg)     Height 07/21/17 1007 6\' 1"  (1.854 m)     Head Circumference --      Peak Flow --      Pain Score 07/21/17 1012 8     Pain Loc --      Pain Edu? --      Excl. in [place]? --    Constitutional: Alert and oriented. Well appearing and in no acute distress. Eyes: Conjunctivae are normal.  Head: Atraumatic. Neck: No stridor.   Cardiovascular: Normal rate, regular rhythm. Grossly normal heart sounds.  Good peripheral circulation. Respiratory: Normal respiratory effort.  No retractions. Lungs CTAB. Musculoskeletal: on examination of the right foot there is moderate tenderness and soft tissue swelling on the dorsal aspect especially over the third, fourth, and fifth metatarsals. Patient skin is intact. There is no gross deformity. Neurologic:  Normal speech and language. No gross focal neurologic deficits are appreciated. No gait instability. Skin:  Skin is warm, dry and intact.  Psychiatric: Mood and affect are normal. Speech and behavior are  normal.  ____________________________________________   LABS (all labs ordered are listed, but only abnormal results are displayed)  Labs Reviewed - No data to display  RADIOLOGY  Dg Foot Complete Right  Result Date: 07/21/2017 CLINICAL DATA:  Pain following fall EXAM: RIGHT FOOT COMPLETE - 3+ VIEW COMPARISON:  November 18, 2011 FINDINGS: Frontal, oblique, and lateral views were obtained. There are comminuted fractures of the mid to distal fourth and fifth metatarsals with mild displacement of fracture fragments in these areas. No other fractures are evident. No dislocation. Bones are osteoporotic. Joint spaces appear normal. There are posterior and inferior calcaneal spurs. IMPRESSION: Comminuted fractures of the mid the distal fourth and fifth metatarsals with mild displacement of fracture fragments, particularly in the fifth metatarsal region. No other areas of fracture. No dislocation. Joint spaces appear unremarkable.  There is underlying osteoporosis. There are calcaneal spurs. Electronically Signed   By: Lowella Grip III M.D.   On: 07/21/2017 10:48    ____________________________________________   PROCEDURES  Procedure(s) performed: None  Procedures  Critical Care performed: No  ____________________________________________   INITIAL IMPRESSION / ASSESSMENT AND PLAN / ED COURSE  Patient was placed in Ace wrap and a postop shoe. Family member states that he does have a walker at home. He is instructed to ice and elevate his foot to reduce swelling. He states that he already sees a podiatrist on a regular basis and will follow up with [facility] for his fractures. Patient was given tramadol as needed for pain. Patient was made aware this medication could cause drowsiness and increase his risk for falling.   ____________________________________________   FINAL CLINICAL IMPRESSION(S) / ED DIAGNOSES  Final diagnoses:  Closed fracture of fourth metatarsal bone of right  foot, physeal involvement unspecified, initial encounter  Closed displaced fracture of fifth metatarsal bone of right foot, initial encounter     ED Discharge Orders        Ordered    traMADol (ULTRAM) 50 MG tablet  Every 6 hours PRN     07/21/17 1119       Note:  This document was prepared using Dragon voice recognition software and may include unintentional dictation errors.    Johnn Hai, PA-C 07/21/17 1418    Schaevitz, Randall An, MD 07/21/17 [id number]

## 2017-07-21 NOTE — Discharge Instructions (Signed)
Follow-up with your podiatrist at [facility]. Call and make an appointment. Wear wooden shoe and Ace wrap for support. Elevate your  foot when sitting. Use ice to reduce swelling. Tramadol 1 every 6 hours as needed for pain. This medication could cause drowsiness and increase your risk for falling. Use your walker for walking.

## 2017-07-24 ENCOUNTER — Encounter: Payer: Self-pay | Admitting: Podiatry

## 2017-07-24 ENCOUNTER — Ambulatory Visit (INDEPENDENT_AMBULATORY_CARE_PROVIDER_SITE_OTHER): Payer: Medicare Other | Admitting: Podiatry

## 2017-07-24 DIAGNOSIS — S92301A Fracture of unspecified metatarsal bone(s), right foot, initial encounter for closed fracture: Secondary | ICD-10-CM | POA: Diagnosis not present

## 2017-07-24 NOTE — Progress Notes (Signed)
This patient presents the office with chief complaint of a painful right foot.  He says he came down on his right foot awkwardly and felt that he injured his foot.  He was seen at the hospital [place] and x-rays were taken.  X-rays revealed  fracture of his 4,5 metatarsals right foot.  They gave him a postop shoe to walk in until he was seen in this office for multiple fractures to his right foot.  He presents the office today wearing bedroom slipper stating that his pain in his right foot is an 8 out of 10.  He resents the office stating that the x-rays can be seen in Epic.    He presents the office today for the follow-up exam and further treatment on his fractured right foot.  General Appearance  Alert, conversant and in no acute stress.  Vascular  Dorsalis pedis and posterior pulses are palpable  bilaterally.  Capillary return is within normal limits  bilaterally. Temperature is within normal limits  Bilaterally.  Neurologic  Senn-Weinstein monofilament wire test within normal limits  Right foot.  LOPS left foot is absent.. Muscle power within normal limits bilaterally.  Nails Thick disfigured discolored nails with subungual debris bilaterally from hallux to fifth toes bilaterally. No evidence of bacterial infection or drainage bilaterally.  Orthopedic  No limitations of motion of motion feet bilaterally.  No crepitus or effusions noted.  No bony pathology or digital deformities noted.  Palpable pain noted at the plantar fascia and the attachment of the Achilles tendon, right foot.thedistal shaft of the fifth metatarsal appears to be in an elevated position.there is increased swelling and pain noted over the fourth and fifth metatarsals of the right foot.  Patient does have severe pain noted midshaft of the fifth metatarsal and at the base of the fifth metatarsal right foot.   Skin  normotropic skin with no porokeratosis noted bilaterally.  No signs of infections or ulcers.   Closed  metatarsal fracture 4,5 right foot.   ROV  X-rays were reviewed on the Epic system and they did reveal a fracture midshaft of the fourth metatarsal.  There is also a fracture at the fifth metatarsal midshaft and the distal fragment is dorsiflexed at the fracture site.  Patient does have swelling and pain consistent with fractures to the fourth and fifth metatarsals.  Patient was dispensed a cam walker for him to ambulate for the next 2 weeks.  He is then to return to the office for continued evaluation of his fractured foot and possibly nail care at that visit.   Gardiner Barefoot DPM

## 2017-08-07 ENCOUNTER — Encounter: Payer: Self-pay | Admitting: Podiatry

## 2017-08-07 ENCOUNTER — Ambulatory Visit: Payer: Medicare Other | Admitting: Podiatry

## 2017-08-07 ENCOUNTER — Ambulatory Visit (INDEPENDENT_AMBULATORY_CARE_PROVIDER_SITE_OTHER): Payer: Medicare Other | Admitting: Podiatry

## 2017-08-07 DIAGNOSIS — B351 Tinea unguium: Secondary | ICD-10-CM | POA: Diagnosis not present

## 2017-08-07 DIAGNOSIS — S92301A Fracture of unspecified metatarsal bone(s), right foot, initial encounter for closed fracture: Secondary | ICD-10-CM | POA: Diagnosis not present

## 2017-08-07 DIAGNOSIS — M79676 Pain in unspecified toe(s): Secondary | ICD-10-CM

## 2017-08-07 MED ORDER — TRAMADOL HCL 50 MG PO TABS: 50.0000 mg | ORAL_TABLET | Freq: Every evening | ORAL | 0 refills | Status: DC | PRN

## 2017-08-07 NOTE — Progress Notes (Signed)
His patient presents to the office for an evaluation of his closed metatarsal fracture 4,5 right foot.  He says that his foot is doing better and that he has been wearing his cam walker as recommended.  He states he is improving and is pleased with his progress.  He presents the office today for continued evaluation of his broken right foot.   General Appearance  Alert, conversant and in no acute stress.  Vascular  Dorsalis pedis and posterior pulses are palpable  bilaterally.  Capillary return is within normal limits  bilaterally. Temperature is within normal limits  Bilaterally.  Neurologic  Senn-Weinstein monofilament wire test within normal limits  bilaterally. Muscle power within normal limits bilaterally.  Nails Thick disfigured discolored nails with subungual debris bilaterally from hallux to fifth toes bilaterally. No evidence of bacterial infection or drainage bilaterally.  Orthopedic  No limitations of motion of motion feet bilaterally.  No crepitus or effusions noted.  No bony pathology or digital deformities noted. Patient has a swollen right dorsal aspect of the right foot.  No increased temperature or redness is noted.  Palpable pain is still noted at the shaft of the fourth and fifth metatarsals right foot.  Skin  normotropic skin with no porokeratosis noted bilaterally.  No signs of infections or ulcers noted.  Since he is due to return next weekforpreventative foot care services  Closed metatarsal fracture, right foot.   ROV  Examination of his foot reveals decreased swelling relative to his initial visit.  He does have persistent swelling but the pain that he had been experiencing has diminished.  I told this patient to continue in his cam walker for 3 weeks and I suggested and prescribed and ankle compression sock to be worn to help to decrease his dorsal swelling.  Patient also requests his nails be   treated since he is to return for preventative foot care services next week.  Patient also says his Cam Gilford Rile has become effective at one of the foot. Velcro straps.  Patient was therefore given an charge for a second [place].  Patient was told to return to the office in 3 weeks for further evaluation and treatment.  An x-ray will be taken at that time to check on the progress of the healing at the fracture sites. An x-ray was not taken at this visit. Since he already has had 2 sets of x-rays of his fractured foot and his foot clinically is markedly improving.   Gardiner Barefoot DPM

## 2017-08-14 ENCOUNTER — Ambulatory Visit: Payer: Medicare Other | Admitting: Podiatry

## 2017-08-21 ENCOUNTER — Ambulatory Visit: Payer: Medicare Other | Admitting: Podiatry

## 2017-09-01 ENCOUNTER — Ambulatory Visit (INDEPENDENT_AMBULATORY_CARE_PROVIDER_SITE_OTHER): Payer: Medicare Other

## 2017-09-01 ENCOUNTER — Encounter: Payer: Self-pay | Admitting: Podiatry

## 2017-09-01 ENCOUNTER — Ambulatory Visit (INDEPENDENT_AMBULATORY_CARE_PROVIDER_SITE_OTHER): Payer: Medicare Other | Admitting: Podiatry

## 2017-09-01 DIAGNOSIS — D689 Coagulation defect, unspecified: Secondary | ICD-10-CM | POA: Diagnosis not present

## 2017-09-01 DIAGNOSIS — S92301A Fracture of unspecified metatarsal bone(s), right foot, initial encounter for closed fracture: Secondary | ICD-10-CM | POA: Diagnosis not present

## 2017-09-01 NOTE — Progress Notes (Signed)
His patient presents to the office for an evaluation of his closed metatarsal fracture 4,5 right foot.  He says that his foot is doing better and that he has been wearing his cam walker as recommended.  He states he is improving and is pleased with his progress.  He presents the office today for continued evaluation of his broken right foot.   General Appearance  Alert, conversant and in no acute stress.  Vascular  Dorsalis pedis and posterior pulses are palpable  bilaterally.  Capillary return is within normal limits  bilaterally. Temperature is within normal limits  Bilaterally.  Neurologic  Senn-Weinstein monofilament wire test within normal limits  bilaterally. Muscle power within normal limits bilaterally.  Nails Thick disfigured discolored nails with subungual debris bilaterally from hallux to fifth toes bilaterally. No evidence of bacterial infection or drainage bilaterally.  Orthopedic  No limitations of motion of motion feet bilaterally.  No crepitus or effusions noted.  No bony pathology or digital deformities noted. Swelling has diminished right foot dorsally.  No increased temperature or redness is noted.  No  palpable pain is still noted at the shaft of the fourth and fifth metatarsals right foot.  Skin  normotropic skin with no porokeratosis noted bilaterally.  No signs of infections or ulcers noted.    Closed metatarsal fracture, right foot.   ROV  Examination of his foot reveals decreased swelling .  There is no evidence of any pain or discomfort upon palpation of the fourth and fifth metatarsals right foot.  Patient presents the office today wearing a Cam Walker and says he is having minimal pain when he ambulates with a Cam Walker.  X-rays were taken at this visit to reveal minimal changes from the initial exam.. X-ray  . No excess callus formation noted at the fracture sites.  Based on his clinical improvement. I recommended that he start wearing a surgical shoe to check on his  pain status during ambulation.  Since this patient is 54. I magnified his clinical results over the findings from the x-ray.  He was told to return to the office in 4-6 weeks with follow-up x-ray will be taken and discussion of his fracture will be performed at that visit.   Gardiner Barefoot DPM

## 2017-09-29 ENCOUNTER — Encounter: Payer: Self-pay | Admitting: Podiatry

## 2017-09-29 ENCOUNTER — Ambulatory Visit: Payer: Medicare Other

## 2017-09-29 ENCOUNTER — Ambulatory Visit (INDEPENDENT_AMBULATORY_CARE_PROVIDER_SITE_OTHER): Payer: Medicare Other | Admitting: Podiatry

## 2017-09-29 DIAGNOSIS — S92301D Fracture of unspecified metatarsal bone(s), right foot, subsequent encounter for fracture with routine healing: Secondary | ICD-10-CM

## 2017-09-29 DIAGNOSIS — M79676 Pain in unspecified toe(s): Secondary | ICD-10-CM | POA: Diagnosis not present

## 2017-09-29 DIAGNOSIS — S92301A Fracture of unspecified metatarsal bone(s), right foot, initial encounter for closed fracture: Secondary | ICD-10-CM | POA: Diagnosis not present

## 2017-09-29 DIAGNOSIS — B351 Tinea unguium: Secondary | ICD-10-CM

## 2017-09-29 DIAGNOSIS — D689 Coagulation defect, unspecified: Secondary | ICD-10-CM | POA: Diagnosis not present

## 2017-09-29 NOTE — Progress Notes (Signed)
His patient presents to the office for an evaluation of his closed metatarsal fracture 4,5 right foot.  He says that his foot is doing better and that he has been wearing his surgical shoe  as recommended.  He states he is improving and is pleased with his progress.  He presents the office today for continued evaluation of his broken right foot.  Patient requests his nails be trimmed on his right foot.   General Appearance  Alert, conversant and in no acute stress.  Vascular  Dorsalis pedis and posterior pulses are palpable  bilaterally.  Capillary return is within normal limits  bilaterally. Temperature is within normal limits  Bilaterally.  Neurologic  Senn-Weinstein monofilament wire test within normal limits  bilaterally. Muscle power within normal limits bilaterally.  Nails Thick disfigured discolored nails with subungual debris bilaterally from hallux to fifth toes bilaterally. No evidence of bacterial infection or drainage bilaterally.  Orthopedic  No limitations of motion of motion feet bilaterally.  No crepitus or effusions noted.  No bony pathology or digital deformities noted. Swelling has diminished right foot dorsally.  No increased temperature or redness is noted.  No  palpable pain is still noted at the shaft of the fourth and fifth metatarsals right foot.  Skin  normotropic skin with no porokeratosis noted bilaterally.  No signs of infections or ulcers noted.    Closed metatarsal fracture, right foot. Onychomycosis right foot.   ROV  Examination of his foot reveals decreased swelling .  There is no evidence of any pain or discomfort upon palpation of the fourth and fifth metatarsals right foot.  Patient presents the office today wearing his surgical shoe.  Xrays reveal callus noted at both surgical sites.  Patient was told to return to regular footwear and use his elastic compression sock.  RTC prn   Gardiner Barefoot DPM

## 2017-11-27 ENCOUNTER — Ambulatory Visit (INDEPENDENT_AMBULATORY_CARE_PROVIDER_SITE_OTHER): Payer: Medicare Other

## 2017-11-27 ENCOUNTER — Ambulatory Visit (INDEPENDENT_AMBULATORY_CARE_PROVIDER_SITE_OTHER): Payer: Medicare Other | Admitting: Podiatry

## 2017-11-27 ENCOUNTER — Encounter: Payer: Self-pay | Admitting: Podiatry

## 2017-11-27 DIAGNOSIS — S92301D Fracture of unspecified metatarsal bone(s), right foot, subsequent encounter for fracture with routine healing: Secondary | ICD-10-CM | POA: Diagnosis not present

## 2017-11-27 DIAGNOSIS — T148XXA Other injury of unspecified body region, initial encounter: Secondary | ICD-10-CM | POA: Diagnosis not present

## 2017-11-27 DIAGNOSIS — D689 Coagulation defect, unspecified: Secondary | ICD-10-CM | POA: Diagnosis not present

## 2017-11-27 MED ORDER — TRAMADOL HCL 50 MG PO TABS: 50.0000 mg | ORAL_TABLET | Freq: Three times a day (TID) | ORAL | 0 refills | Status: AC | PRN

## 2017-11-27 MED ORDER — TRAMADOL HCL 50 MG PO TABS: 50.0000 mg | ORAL_TABLET | Freq: Three times a day (TID) | ORAL | 0 refills | Status: DC | PRN

## 2017-11-27 NOTE — Progress Notes (Signed)
This patient returns to the office for an evaluation of a closed metatarsal  fracture of his fourth and fifth metatarsal bones right foot.  He presents today stating that his right foot is still giving him a fit due to the pain.  He has been walking and wearing his  regular shoes.  He presents the office today to have his right foot evaluated following the fracture and determine the reason for the pain.  General Appearance  Alert, conversant and in no acute stress.  Vascular  Dorsalis pedis and posterior tibial  pulses are palpable  bilaterally.  Capillary return is within normal limits  bilaterally. Temperature is within normal limits  bilaterally.  Neurologic  Senn-Weinstein monofilament wire test within normal limits  bilaterally. Muscle power within normal limits bilaterally.  Nails Thick disfigured discolored nails with subungual debris  from hallux to fifth toes bilaterally. No evidence of bacterial infection or drainage bilaterally.  Orthopedic  No limitations of motion of motion feet .  No crepitus or effusions noted.  There is minimal swelling and minimal increased temperature on the dorsum of the right foot at the fracture sites. Palpable pain is noted at the actual fracture sites.  Skin  normotropic skin with no porokeratosis noted bilaterally.  No signs of infections or ulcers noted.    S/P metatarsal fracture right foot.  ROV.  X-rays taken reveal healing noted at both fracture sites.  No evidence of a nonunion is noted.   The patient is having minimal clinical findings.  I recommended this patient return s surgical shoe an elastic sock when he walks.  He was also called in a prescription of tramadol to take as needed for pain.  Explained to him that the fracture site is healing and that this pain will ultimately resolve.  RTC prn.   Gardiner Barefoot DPM

## 2017-12-17 ENCOUNTER — Telehealth: Payer: Self-pay | Admitting: Podiatry

## 2017-12-17 NOTE — Telephone Encounter (Signed)
pts wife returning call from me reguarding diabetic shoes and left me a message to call her back.  I called back and pt did get shoes last year but not from our office.She stated pt was getting the run around with our office and I explained that we have particular paperwork that has to be filled out by his doctor that treats him for diabetes and that we had the wrong doctor last year. I told her I would put in the note for the next appt 6.17 to discuss diabetic shoes and if he qualifies we would get him scheduled to see Liliane Channel for measurements and to pick out shoes. I did verify the correct doctor is in pts chart. She was not at home and was going to give the information to the patient.

## 2018-01-01 ENCOUNTER — Ambulatory Visit: Payer: Medicare Other | Admitting: Podiatry

## 2018-01-05 ENCOUNTER — Ambulatory Visit: Payer: Medicare Other | Admitting: Podiatry

## 2018-01-12 ENCOUNTER — Ambulatory Visit: Payer: Medicare Other | Admitting: Podiatry

## 2018-01-15 ENCOUNTER — Encounter: Payer: Self-pay | Admitting: Podiatry

## 2018-01-15 ENCOUNTER — Ambulatory Visit (INDEPENDENT_AMBULATORY_CARE_PROVIDER_SITE_OTHER): Payer: Medicare Other | Admitting: Podiatry

## 2018-01-15 DIAGNOSIS — S92301D Fracture of unspecified metatarsal bone(s), right foot, subsequent encounter for fracture with routine healing: Secondary | ICD-10-CM

## 2018-01-15 DIAGNOSIS — D689 Coagulation defect, unspecified: Secondary | ICD-10-CM

## 2018-01-15 DIAGNOSIS — M79676 Pain in unspecified toe(s): Secondary | ICD-10-CM | POA: Diagnosis not present

## 2018-01-15 DIAGNOSIS — E1142 Type 2 diabetes mellitus with diabetic polyneuropathy: Secondary | ICD-10-CM

## 2018-01-15 DIAGNOSIS — B351 Tinea unguium: Secondary | ICD-10-CM

## 2018-01-15 DIAGNOSIS — M2040 Other hammer toe(s) (acquired), unspecified foot: Secondary | ICD-10-CM

## 2018-01-15 DIAGNOSIS — M2011 Hallux valgus (acquired), right foot: Secondary | ICD-10-CM

## 2018-01-15 NOTE — Progress Notes (Signed)
Complaint:  Visit Type: Patient returns to my office for continued preventative foot care services. Complaint: Patient states" my nails have grown long and thick and become painful to walk and wear shoes" Patient has been diagnosed with DM with no foot complications. The patient presents for preventative foot care services. No changes to ROS.  Patient feels his right foot has healed from previous metatarsal fractures  .  Podiatric Exam: Vascular: dorsalis pedis and posterior tibial pulses are palpable bilateral. Capillary return is immediate. Temperature gradient is WNL. Skin turgor WNL  Sensorium: Normal Semmes Weinstein monofilament test right foot.  Absent LOPS left foot.. Normal tactile sensation bilaterally. Nail Exam: Pt has thick disfigured discolored nails with subungual debris noted bilateral entire nail second  through fifth toenails Ulcer Exam: There is no evidence of ulcer or pre-ulcerative changes or infection. Orthopedic Exam: Muscle tone and strength are WNL. No limitations in general ROM. No crepitus or effusions noted. Foot type and digits show no abnormalities. Hallux limitus 1st MPJ  B/L  Hammer toes  B/L. Skin: No Porokeratosis. No infection or ulcers  Diagnosis:  Onychomycosis, , Pain in right toe, pain in left toes  Treatment & Plan Procedures and Treatment: Consent by patient was obtained for treatment procedures. The patient understood the discussion of treatment and procedures well. All questions were answered thoroughly reviewed. Debridement of mycotic and hypertrophic toenails, 1 through 5 bilateral and clearing of subungual debris. No ulceration, no infection noted. Patient qualifies for diabetic shoes due to DPN, HAV and hammer toes. Return Visit-Office Procedure: Patient instructed to return to the office for a follow up visit 3 months for continued evaluation and treatment.    Gardiner Barefoot DPM

## 2018-04-23 ENCOUNTER — Encounter: Payer: Self-pay | Admitting: Podiatry

## 2018-04-23 ENCOUNTER — Ambulatory Visit (INDEPENDENT_AMBULATORY_CARE_PROVIDER_SITE_OTHER): Payer: Medicare Other | Admitting: Podiatry

## 2018-04-23 DIAGNOSIS — D689 Coagulation defect, unspecified: Secondary | ICD-10-CM

## 2018-04-23 DIAGNOSIS — M79676 Pain in unspecified toe(s): Secondary | ICD-10-CM | POA: Diagnosis not present

## 2018-04-23 DIAGNOSIS — E1142 Type 2 diabetes mellitus with diabetic polyneuropathy: Secondary | ICD-10-CM

## 2018-04-23 DIAGNOSIS — B351 Tinea unguium: Secondary | ICD-10-CM

## 2018-04-23 NOTE — Progress Notes (Signed)
Complaint:  Visit Type: Patient returns to my office for continued preventative foot care services. Complaint: Patient states" my nails have grown long and thick and become painful to walk and wear shoes" Patient has been diagnosed with DM with no foot complications. The patient presents for preventative foot care services. No changes to ROS  Podiatric Exam: Vascular: dorsalis pedis and posterior tibial pulses are palpable bilateral. Capillary return is immediate. Temperature gradient is WNL. Skin turgor WNL  Sensorium: Normal Semmes Weinstein monofilament test right foot.  Absent LOPS left foot.. Normal tactile sensation bilaterally. Nail Exam: Pt has thick disfigured discolored nails with subungual debris noted bilateral entire nail second  through fifth toenails Ulcer Exam: There is no evidence of ulcer or pre-ulcerative changes or infection. Orthopedic Exam: Muscle tone and strength are WNL. No limitations in general ROM. No crepitus or effusions noted. Foot type and digits show no abnormalities. Bony prominences are unremarkable. Skin: No Porokeratosis. No infection or ulcers  Diagnosis:  Onychomycosis, , Pain in right toe, pain in left toes  Treatment & Plan Procedures and Treatment: Consent by patient was obtained for treatment procedures. The patient understood the discussion of treatment and procedures well. All questions were answered thoroughly reviewed. Debridement of mycotic and hypertrophic toenails, 1 through 5 bilateral and clearing of subungual debris. No ulceration, no infection noted.  Return Visit-Office Procedure: Patient instructed to return to the office for a follow up visit 3 months for continued evaluation and treatment.    Gardiner Barefoot DPM

## 2018-07-20 ENCOUNTER — Encounter: Payer: Self-pay | Admitting: Podiatry

## 2018-07-20 ENCOUNTER — Ambulatory Visit (INDEPENDENT_AMBULATORY_CARE_PROVIDER_SITE_OTHER): Payer: Medicare Other | Admitting: Podiatry

## 2018-07-20 DIAGNOSIS — M722 Plantar fascial fibromatosis: Secondary | ICD-10-CM

## 2018-07-20 DIAGNOSIS — M79676 Pain in unspecified toe(s): Secondary | ICD-10-CM

## 2018-07-20 DIAGNOSIS — B351 Tinea unguium: Secondary | ICD-10-CM | POA: Diagnosis not present

## 2018-07-20 DIAGNOSIS — E1142 Type 2 diabetes mellitus with diabetic polyneuropathy: Secondary | ICD-10-CM | POA: Diagnosis not present

## 2018-07-20 DIAGNOSIS — D689 Coagulation defect, unspecified: Secondary | ICD-10-CM | POA: Diagnosis not present

## 2018-07-20 NOTE — Progress Notes (Signed)
Complaint:  Visit Type: Patient returns to my office for continued preventative foot care services. Complaint: Patient states" my nails have grown long and thick and become painful to walk and wear shoes" Patient has been diagnosed with DM with no foot complications. The patient presents for preventative foot care services. No changes to ROS.  Patient also says he has developed pain in his right heel.  He says he experiences pain and discomfort after walking.  He was treated with injection therapy previously and was pain free until today.  Podiatric Exam: Vascular: dorsalis pedis and posterior tibial pulses are palpable bilateral. Capillary return is immediate. Temperature gradient is WNL. Skin turgor WNL  Sensorium: Normal Semmes Weinstein monofilament test right foot.  Absent LOPS left foot.. Normal tactile sensation bilaterally. Nail Exam: Pt has thick disfigured discolored nails with subungual debris noted bilateral entire nail second  through fifth toenails Ulcer Exam: There is no evidence of ulcer or pre-ulcerative changes or infection. Orthopedic Exam: Muscle tone and strength are WNL. No limitations in general ROM. No crepitus or effusions noted. Foot type and digits show no abnormalities. Bony prominences are unremarkable. Palpable pain at the insertion plantar fascia right heel Skin: No Porokeratosis. No infection or ulcers  Diagnosis:  Onychomycosis, , Pain in right toe, pain in left toes,  Plantar fasciitis  Right foot.  Treatment & Plan Procedures and Treatment: Consent by patient was obtained for treatment procedures. The patient understood the discussion of treatment and procedures well. All questions were answered thoroughly reviewed. Debridement of mycotic and hypertrophic toenails, 1 through 5 bilateral and clearing of subungual debris. No ulceration, no infection noted. Injection therapy right foot.  Injection therapy using 1.0 cc. Of 2% xylocaine( 20 mg.) plus 1 cc. of kenalog-la (  10 mg) plus 1/2 cc. of dexamethazone phosphate ( 2 mg) in right heel.   Return Visit-Office Procedure: Patient instructed to return to the office for a follow up visit 3 months for continued evaluation and treatment.    Gardiner Barefoot DPM

## 2018-10-19 ENCOUNTER — Ambulatory Visit (INDEPENDENT_AMBULATORY_CARE_PROVIDER_SITE_OTHER): Payer: Medicare Other | Admitting: Podiatry

## 2018-10-19 ENCOUNTER — Other Ambulatory Visit: Payer: Self-pay

## 2018-10-19 ENCOUNTER — Encounter: Payer: Self-pay | Admitting: Podiatry

## 2018-10-19 DIAGNOSIS — B351 Tinea unguium: Secondary | ICD-10-CM | POA: Diagnosis not present

## 2018-10-19 DIAGNOSIS — E1142 Type 2 diabetes mellitus with diabetic polyneuropathy: Secondary | ICD-10-CM

## 2018-10-19 DIAGNOSIS — M79676 Pain in unspecified toe(s): Secondary | ICD-10-CM

## 2018-10-19 DIAGNOSIS — D689 Coagulation defect, unspecified: Secondary | ICD-10-CM

## 2018-10-19 NOTE — Progress Notes (Signed)
Complaint:  Visit Type: Patient returns to my office for continued preventative foot care services. Complaint: Patient states" my nails have grown long and thick and become painful to walk and wear shoes" Patient has been diagnosed with DM with no foot complications. The patient presents for preventative foot care services. No changes to ROS.  Patient is taking plavix.  Podiatric Exam: Vascular: dorsalis pedis and posterior tibial pulses are palpable bilateral. Capillary return is immediate. Temperature gradient is WNL. Skin turgor WNL  Sensorium: Normal Semmes Weinstein monofilament test right foot.  Absent LOPS left foot.. Normal tactile sensation bilaterally. Nail Exam: Pt has thick disfigured discolored nails with subungual debris noted bilateral entire nail second  through fifth toenails Ulcer Exam: There is no evidence of ulcer or pre-ulcerative changes or infection. Orthopedic Exam: Muscle tone and strength are WNL. No limitations in general ROM. No crepitus or effusions noted. Foot type and digits show no abnormalities. Bony prominences are unremarkable. Skin: No Porokeratosis. No infection or ulcers  Diagnosis:  Onychomycosis, , Pain in right toe, pain in left toes  Treatment & Plan Procedures and Treatment: Consent by patient was obtained for treatment procedures. The patient understood the discussion of treatment and procedures well. All questions were answered thoroughly reviewed. Debridement of mycotic and hypertrophic toenails, 1 through 5 bilateral and clearing of subungual debris. No ulceration, no infection noted.  Return Visit-Office Procedure: Patient instructed to return to the office for a follow up visit 3 months for continued evaluation and treatment.    Gardiner Barefoot DPM

## 2018-12-28 ENCOUNTER — Encounter: Payer: Self-pay | Admitting: Podiatry

## 2018-12-28 ENCOUNTER — Ambulatory Visit (INDEPENDENT_AMBULATORY_CARE_PROVIDER_SITE_OTHER): Payer: Medicare Other | Admitting: Podiatry

## 2018-12-28 ENCOUNTER — Other Ambulatory Visit: Payer: Self-pay

## 2018-12-28 VITALS — Temp 98.5°F

## 2018-12-28 DIAGNOSIS — M129 Arthropathy, unspecified: Secondary | ICD-10-CM

## 2018-12-28 DIAGNOSIS — D689 Coagulation defect, unspecified: Secondary | ICD-10-CM | POA: Diagnosis not present

## 2018-12-28 DIAGNOSIS — E1142 Type 2 diabetes mellitus with diabetic polyneuropathy: Secondary | ICD-10-CM | POA: Diagnosis not present

## 2018-12-28 NOTE — Progress Notes (Signed)
This patient presents the office with chief complaint of painful burning at night.  This patient states that both feet feel like they are burning and on fire.  This patient is diabetic and has taken both Lyrica and gabapentin.  His burning pain persists.  He says he does take Tylenol at night which helps to stop the throbbing pain.  He presents the office today for continued evaluation of his diabetic neuropathy.  General Appearance  Alert, conversant and in no acute stress.  Vascular  Dorsalis pedis and posterior tibial  pulses are palpable  bilaterally.  Capillary return is within normal limits  bilaterally. Temperature is within normal limits  bilaterally.  Neurologic  Senn-Weinstein monofilament wire test diminished   bilaterally. Muscle power within normal limits bilaterally.  Nails Thick disfigured discolored nails with subungual debris  from hallux to fifth toes bilaterally. No evidence of bacterial infection or drainage bilaterally.  Orthopedic  No limitations of motion  feet .  No crepitus or effusions noted.  No bony pathology or digital deformities noted.  Skin  normotropic skin with no porokeratosis noted bilaterally.  No signs of infections or ulcers noted.    Diabetic neuropathy  B/l.  ROV.  Discussed this condition with its patient.  Told this patient that the 2 medications that he has already taken are medications typically prescribed by doctors for neuropathy.  If the medications are not helpful I have nothing to offer this patient.  I did tell him he can continue to take his Tylenol if he states that that is beneficial at night.  Patient to return to the office in 4 weeks for his preventative foot care services.  Gardiner Barefoot DPM

## 2019-01-18 ENCOUNTER — Encounter: Payer: Self-pay | Admitting: Podiatry

## 2019-01-18 ENCOUNTER — Ambulatory Visit (INDEPENDENT_AMBULATORY_CARE_PROVIDER_SITE_OTHER): Payer: Medicare Other | Admitting: Podiatry

## 2019-01-18 ENCOUNTER — Other Ambulatory Visit: Payer: Self-pay

## 2019-01-18 DIAGNOSIS — B351 Tinea unguium: Secondary | ICD-10-CM | POA: Insufficient documentation

## 2019-01-18 DIAGNOSIS — M79674 Pain in right toe(s): Secondary | ICD-10-CM

## 2019-01-18 DIAGNOSIS — M79675 Pain in left toe(s): Secondary | ICD-10-CM | POA: Diagnosis not present

## 2019-01-18 DIAGNOSIS — D689 Coagulation defect, unspecified: Secondary | ICD-10-CM

## 2019-01-18 NOTE — Progress Notes (Signed)
Complaint:  Visit Type: Patient returns to my office for continued preventative foot care services. Complaint: Patient states" my nails have grown long and thick and become painful to walk and wear shoes" Patient has been diagnosed with DM with no foot complications. The patient presents for preventative foot care services. No changes to ROS.  Patient is taking plavix.  Podiatric Exam: Vascular: dorsalis pedis and posterior tibial pulses are palpable bilateral. Capillary return is immediate. Temperature gradient is WNL. Skin turgor WNL  Sensorium: Normal Semmes Weinstein monofilament test right foot.  Absent LOPS left foot.. Normal tactile sensation bilaterally. Nail Exam: Pt has thick disfigured discolored nails with subungual debris noted bilateral entire nail second  through fifth toenails Ulcer Exam: There is no evidence of ulcer or pre-ulcerative changes or infection. Orthopedic Exam: Muscle tone and strength are WNL. No limitations in general ROM. No crepitus or effusions noted. Foot type and digits show no abnormalities. Bony prominences are unremarkable. Skin: No Porokeratosis. No infection or ulcers  Diagnosis:  Onychomycosis, , Pain in right toe, pain in left toes  Treatment & Plan Procedures and Treatment: Consent by patient was obtained for treatment procedures. The patient understood the discussion of treatment and procedures well. All questions were answered thoroughly reviewed. Debridement of mycotic and hypertrophic toenails, 1 through 5 bilateral and clearing of subungual debris. No ulceration, no infection noted.  Return Visit-Office Procedure: Patient instructed to return to the office for a follow up visit 3 months for continued evaluation and treatment.    Gardiner Barefoot DPM

## 2019-04-19 ENCOUNTER — Other Ambulatory Visit: Payer: Self-pay

## 2019-04-19 ENCOUNTER — Encounter: Payer: Self-pay | Admitting: Podiatry

## 2019-04-19 ENCOUNTER — Ambulatory Visit (INDEPENDENT_AMBULATORY_CARE_PROVIDER_SITE_OTHER): Payer: Medicare Other | Admitting: Podiatry

## 2019-04-19 DIAGNOSIS — D689 Coagulation defect, unspecified: Secondary | ICD-10-CM

## 2019-04-19 DIAGNOSIS — M79675 Pain in left toe(s): Secondary | ICD-10-CM

## 2019-04-19 DIAGNOSIS — M79674 Pain in right toe(s): Secondary | ICD-10-CM

## 2019-04-19 DIAGNOSIS — B351 Tinea unguium: Secondary | ICD-10-CM

## 2019-04-19 DIAGNOSIS — E1142 Type 2 diabetes mellitus with diabetic polyneuropathy: Secondary | ICD-10-CM

## 2019-04-19 NOTE — Progress Notes (Signed)
Complaint:  Visit Type: Patient returns to my office for continued preventative foot care services. Complaint: Patient states" my nails have grown long and thick and become painful to walk and wear shoes" Patient has been diagnosed with DM with no foot complications. The patient presents for preventative foot care services. No changes to ROS.  Patient is taking plavix.  Podiatric Exam: Vascular: dorsalis pedis and posterior tibial pulses are palpable bilateral. Capillary return is immediate. Temperature gradient is WNL. Skin turgor WNL  Sensorium: Normal Semmes Weinstein monofilament test right foot.  Absent LOPS left foot.. Normal tactile sensation bilaterally. Nail Exam: Pt has thick disfigured discolored nails with subungual debris noted bilateral entire nail second  through fifth toenails Ulcer Exam: There is no evidence of ulcer or pre-ulcerative changes or infection. Orthopedic Exam: Muscle tone and strength are WNL. No limitations in general ROM. No crepitus or effusions noted. Foot type and digits show no abnormalities. Bony prominences are unremarkable. Skin: No Porokeratosis. No infection or ulcers  Diagnosis:  Onychomycosis, , Pain in right toe, pain in left toes  Treatment & Plan Procedures and Treatment: Consent by patient was obtained for treatment procedures. The patient understood the discussion of treatment and procedures well. All questions were answered thoroughly reviewed. Debridement of mycotic and hypertrophic toenails, 1 through 5 bilateral and clearing of subungual debris. No ulceration, no infection noted. Patient had localized pain on nail spicule left hallux.  No infection. Return Visit-Office Procedure: Patient instructed to return to the office for a follow up visit 3 months for continued evaluation and treatment.    Gardiner Barefoot DPM

## 2019-07-05 ENCOUNTER — Ambulatory Visit (INDEPENDENT_AMBULATORY_CARE_PROVIDER_SITE_OTHER): Payer: Medicare Other | Admitting: Podiatry

## 2019-07-05 ENCOUNTER — Encounter: Payer: Self-pay | Admitting: Podiatry

## 2019-07-05 DIAGNOSIS — E1142 Type 2 diabetes mellitus with diabetic polyneuropathy: Secondary | ICD-10-CM

## 2019-07-05 DIAGNOSIS — B351 Tinea unguium: Secondary | ICD-10-CM

## 2019-07-05 DIAGNOSIS — M79674 Pain in right toe(s): Secondary | ICD-10-CM

## 2019-07-05 DIAGNOSIS — M79675 Pain in left toe(s): Secondary | ICD-10-CM | POA: Diagnosis not present

## 2019-07-05 DIAGNOSIS — D689 Coagulation defect, unspecified: Secondary | ICD-10-CM | POA: Diagnosis not present

## 2019-07-05 NOTE — Progress Notes (Signed)
Complaint:  Visit Type: Patient returns to my office for continued preventative foot care services. Complaint: Patient states" my nails have grown long and thick and become painful to walk and wear shoes" Patient has been diagnosed with DM with no foot complications. The patient presents for preventative foot care services. No changes to ROS.  Patient is taking plavix.  Podiatric Exam: Vascular: dorsalis pedis and posterior tibial pulses are palpable bilateral. Capillary return is immediate. Temperature gradient is WNL. Skin turgor WNL  Sensorium: Normal Semmes Weinstein monofilament test right foot.  Absent LOPS left foot.. Normal tactile sensation bilaterally. Nail Exam: Pt has thick disfigured discolored nails with subungual debris noted bilateral entire nail second  through fifth toenails Ulcer Exam: There is no evidence of ulcer or pre-ulcerative changes or infection. Orthopedic Exam: Muscle tone and strength are WNL. No limitations in general ROM. No crepitus or effusions noted. Foot type and digits show no abnormalities. Bony prominences are unremarkable. Skin: No Porokeratosis. No infection or ulcers  Diagnosis:  Onychomycosis, , Pain in right toe, pain in left toes  Treatment & Plan Procedures and Treatment: Consent by patient was obtained for treatment procedures. The patient understood the discussion of treatment and procedures well. All questions were answered thoroughly reviewed. Debridement of mycotic and hypertrophic toenails, 1 through 5 bilateral and clearing of subungual debris. No ulceration, no infection noted. .  No infection. Return Visit-Office Procedure: Patient instructed to return to the office for a follow up visit 3 months for continued evaluation and treatment.    Gardiner Barefoot DPM

## 2019-07-08 ENCOUNTER — Ambulatory Visit: Payer: Medicare Other | Admitting: Podiatry

## 2019-07-27 ENCOUNTER — Encounter: Payer: Self-pay | Admitting: Podiatry

## 2019-07-27 ENCOUNTER — Ambulatory Visit (INDEPENDENT_AMBULATORY_CARE_PROVIDER_SITE_OTHER): Payer: Medicare Other | Admitting: Podiatry

## 2019-07-27 ENCOUNTER — Other Ambulatory Visit: Payer: Self-pay | Admitting: Podiatry

## 2019-07-27 ENCOUNTER — Ambulatory Visit (INDEPENDENT_AMBULATORY_CARE_PROVIDER_SITE_OTHER): Payer: Medicare Other

## 2019-07-27 ENCOUNTER — Other Ambulatory Visit: Payer: Self-pay

## 2019-07-27 DIAGNOSIS — M7672 Peroneal tendinitis, left leg: Secondary | ICD-10-CM

## 2019-07-27 DIAGNOSIS — M25572 Pain in left ankle and joints of left foot: Secondary | ICD-10-CM | POA: Diagnosis not present

## 2019-07-27 NOTE — Progress Notes (Signed)
Subjective:  Patient ID: Jacob Silva, male    DOB: 1931/10/03,  MRN: [id number]  Chief Complaint  Patient presents with  . Ankle Pain    pt is here for left ankle pain of the left foot, pain has been going on for about 3-4 days, pt also states that pain is aggravated when walking    85 y.o. male presents with the above complaint.  Patient presents with pain to the left posterior lateral ankle.  Patient states it came out of nowhere started couple of days ago.  He states he woke up 1 day and had the pain.  Patient states is aching throbbing and hurting a lot.  Patient states he has a history of gout from long time ago.  He denies eating a lot of red meat or drinking alcohol.  He states is aggravated on walking.  He states he took a Tylenol but has not been much helping.  He denies any other acute complaints at this time.  He denies seeing any other podiatrist for this.  Patient is well-known to Dr. Prudence Davidson who does routine foot care for him.   Review of Systems: Negative except as noted in the HPI. Denies N/V/F/Ch.  Past Medical History:  Diagnosis Date  . Diabetes ([place])   . HBP (high blood pressure)     Current Outpatient Medications:  .  acetaminophen (TYLENOL) 325 MG tablet, Take by mouth., Disp: , Rfl:  .  albuterol (PROAIR HFA) 108 (90 BASE) MCG/ACT inhaler, , Disp: , Rfl:  .  Alcohol Swabs (ALCOHOL WIPES) 70 % PADS, IV Prep Wipes medicated, Disp: , Rfl:  .  allopurinol (ZYLOPRIM) 100 MG tablet, Take by mouth., Disp: , Rfl:  .  amLODipine-benazepril (LOTREL) 5-20 MG capsule, TK 1 C PO QD, Disp: , Rfl:  .  aspirin 325 MG tablet, Take 325 mg by mouth daily., Disp: , Rfl:  .  atorvastatin (LIPITOR) 80 MG tablet, Take by mouth., Disp: , Rfl:  .  ATROVENT HFA 17 MCG/ACT inhaler, USE 2 PUFFS BID, Disp: , Rfl: 0 .  BD PEN NEEDLE MICRO U/F 32G X 6 MM MISC, SMARTSIG:1 Pre-Filled Pen Syringe SUB-Q Every Night, Disp: , Rfl:  .  benazepril (LOTENSIN) 10 MG tablet, Take by mouth., Disp: ,  Rfl:  .  benzonatate (TESSALON) 100 MG capsule, benzonatate 100 mg capsule, Disp: , Rfl:  .  Blood Glucose Monitoring Suppl (FREESTYLE LITE) DEVI, 2 (two) times daily. for testing, Disp: , Rfl: 0 .  butalbital-acetaminophen-caffeine (FIORICET, ESGIC) 50-325-40 MG per tablet, Take by mouth., Disp: , Rfl:  .  carvedilol (COREG) 25 MG tablet, Take by mouth., Disp: , Rfl:  .  clopidogrel (PLAVIX) 75 MG tablet, Take by mouth., Disp: , Rfl:  .  diphenoxylate-atropine (LOMOTIL) 2.5-0.025 MG tablet, diphenoxylate-atropine 2.5 mg-0.025 mg tablet, Disp: , Rfl:  .  fluconazole (DIFLUCAN) 200 MG tablet, fluconazole 200 mg tablet, Disp: , Rfl:  .  fluorometholone (FML) 0.1 % ophthalmic suspension, fluorometholone 0.1 % eye drops,suspension, Disp: , Rfl:  .  formoterol (PERFOROMIST) 20 MCG/2ML nebulizer solution, Inhale into the lungs., Disp: , Rfl:  .  FREESTYLE LITE test strip, TEST AS DIRECTED TWICE A DAY, Disp: , Rfl: 99 .  furosemide (LASIX) 20 MG tablet, Take by mouth., Disp: , Rfl:  .  gabapentin (NEURONTIN) 400 MG capsule, Take by mouth., Disp: , Rfl:  .  guaiFENesin-codeine (ROBITUSSIN AC) 100-10 MG/5ML syrup, Virtussin AC 10 mg-100 mg/5 mL oral liquid, Disp: , Rfl:  .  hydrALAZINE (APRESOLINE) 50 MG tablet, Take by mouth., Disp: , Rfl:  .  Influenza vac split quadrivalent PF (FLUZONE HIGH-DOSE) 0.5 ML injection, Fluzone High-Dose 2018-2019 (PF) 180 mcg/0.5 mL intramuscular syringe  inject 0.5 milliliter intramuscularly, Disp: , Rfl:  .  insulin aspart (NOVOLOG) 100 UNIT/ML SOCT cartridge, Inject 30 Units into the skin once., Disp: , Rfl:  .  isosorbide mononitrate (IMDUR) 30 MG 24 hr tablet, Take by mouth., Disp: , Rfl:  .  ketoconazole (NIZORAL) 2 % cream, ketoconazole 2 % topical cream, Disp: , Rfl:  .  Lancets (FREESTYLE) lancets, TEST AS DIRECTED TWICE A DAY, Disp: , Rfl: 99 .  LANTUS SOLOSTAR 100 UNIT/ML Solostar Pen, , Disp: , Rfl:  .  levothyroxine (SYNTHROID, LEVOTHROID) 50 MCG tablet, Take  by mouth., Disp: , Rfl:  .  lisinopril (PRINIVIL,ZESTRIL) 40 MG tablet, Take by mouth., Disp: , Rfl:  .  metolazone (ZAROXOLYN) 2.5 MG tablet, , Disp: , Rfl:  .  Multiple Vitamin (MULTI-VITAMINS) TABS, Take by mouth., Disp: , Rfl:  .  nitroGLYCERIN (NITROSTAT) 0.4 MG SL tablet, Place under the tongue., Disp: , Rfl:  .  NON FORMULARY, Preformist 27ml/vial once Q12 hours, Disp: , Rfl:  .  Omeprazole 20 MG TBEC, Take by mouth., Disp: , Rfl:  .  pneumococcal 13-valent conjugate vaccine (PREVNAR 13) SUSP injection, Prevnar 13 (PF) 0.5 mL intramuscular syringe  inject 0.5 milliliter intramuscularly, Disp: , Rfl:  .  potassium chloride SA (K-DUR,KLOR-CON) 20 MEQ tablet, potassium chloride ER 20 mEq tablet,extended release(part/cryst), Disp: , Rfl:  .  ranolazine (RANEXA) 1000 MG SR tablet, Take by mouth., Disp: , Rfl:  .  simvastatin (ZOCOR) 80 MG tablet, Take by mouth., Disp: , Rfl:  .  traMADol (ULTRAM) 50 MG tablet, tramadol 50 mg tablet, Disp: , Rfl:  .  umeclidinium-vilanterol (ANORO ELLIPTA) 62.5-25 MCG/INH AEPB, Inhale into the lungs., Disp: , Rfl:  .  Vitamin D, Ergocalciferol, (DRISDOL) 50000 UNITS CAPS capsule, Take by mouth., Disp: , Rfl:  .  tiotropium (SPIRIVA HANDIHALER) 18 MCG inhalation capsule, Place into inhaler and inhale., Disp: , Rfl:   Social History   Tobacco Use  Smoking Status Never Smoker  Smokeless Tobacco Never Used    Allergies  Allergen Reactions  . Penicillins Swelling   Objective:  There were no vitals filed for this visit. There is no height or weight on file to calculate BMI. Constitutional Well developed. Well nourished.  Vascular Dorsalis pedis pulses palpable bilaterally. Posterior tibial pulses palpable bilaterally. Capillary refill normal to all digits.  No cyanosis or clubbing noted. Pedal hair growth normal.  Neurologic Normal speech. Oriented to person, place, and time. Epicritic sensation to light touch grossly present bilaterally.   Dermatologic Nails well groomed and normal in appearance. No open wounds. No skin lesions.  Orthopedic:  Pain on palpation to the peroneal tendon at the level of the lateral malleoli.  Pain with eversion and inversion active and passive range of motion.  No pain at the ATFL the Achilles tendon posterior tibial tendon or the ankle joint.   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  Mild arthritic changes noted at the subtalar joint.  No other bony abnormalities noted.   Assessment:   1. Left ankle pain, unspecified chronicity    Plan:  Patient was evaluated and treated and all questions answered.  Left peroneal tendinitis -I explained to the patient the etiology of tendinitis  and all the various treatment options available to treat him.  Given that patient is having 10 out of 10 pain I believe immobilization of the left ankle is beneficial.  I will place the patient in a cam boot for next 4 weeks to allow for tendon healing.  I also believe the patient will benefit from a steroid injection at the point of maximal tenderness.  Patient agrees with the plan and would like to proceed with injection.  I explained to the patient that I do not like to inject in the tendon given the high risk of rupture.  Patient understood that and would like to proceed with injection. -A steroid injection was performed at left lateral ankle using 1% plain Lidocaine and 10 mg of Kenalog. This was well tolerated.   No follow-ups on file.

## 2019-08-24 ENCOUNTER — Ambulatory Visit: Payer: Medicare Other | Admitting: Podiatry

## 2019-08-31 ENCOUNTER — Ambulatory Visit (INDEPENDENT_AMBULATORY_CARE_PROVIDER_SITE_OTHER): Payer: Medicare Other | Admitting: Podiatry

## 2019-08-31 ENCOUNTER — Encounter: Payer: Self-pay | Admitting: Podiatry

## 2019-08-31 ENCOUNTER — Other Ambulatory Visit: Payer: Self-pay

## 2019-08-31 DIAGNOSIS — M779 Enthesopathy, unspecified: Secondary | ICD-10-CM

## 2019-08-31 DIAGNOSIS — M25572 Pain in left ankle and joints of left foot: Secondary | ICD-10-CM | POA: Diagnosis not present

## 2019-08-31 DIAGNOSIS — M7672 Peroneal tendinitis, left leg: Secondary | ICD-10-CM

## 2019-08-31 DIAGNOSIS — E1142 Type 2 diabetes mellitus with diabetic polyneuropathy: Secondary | ICD-10-CM

## 2019-08-31 NOTE — Progress Notes (Signed)
Subjective:  Patient ID: Jacob Silva, male    DOB: 14-Apr-1932,  MRN: [id number]  Chief Complaint  Patient presents with  . Ankle Pain    pt is for ankle pain f/u of the left ankle, pt states that he no longer feels any pain is the left ankle, injection he recieved last time has helped. Pt states that he no longer has any comments or concerns    86 y.o. male presents with the above complaint.  Patient presents with a follow-up from a left peroneal tendinitis.  Patient states that after the injection she is he feels a lot better.  He denies any other complaints of pain.  Patient states is greater than 80% better however not 100%.  Patient would like to know if there is anything else that could be done to help take the pain all the way down to 100%.  He does not have any trouble walking or applying pressure at this time.  He denies any other acute complaints.   Review of Systems: Negative except as noted in the HPI. Denies N/V/F/Ch.  Past Medical History:  Diagnosis Date  . Diabetes ([place])   . HBP (high blood pressure)     Current Outpatient Medications:  .  acetaminophen (TYLENOL) 325 MG tablet, Take by mouth., Disp: , Rfl:  .  albuterol (PROAIR HFA) 108 (90 BASE) MCG/ACT inhaler, , Disp: , Rfl:  .  Alcohol Swabs (ALCOHOL WIPES) 70 % PADS, IV Prep Wipes medicated, Disp: , Rfl:  .  allopurinol (ZYLOPRIM) 100 MG tablet, Take by mouth., Disp: , Rfl:  .  amLODipine-benazepril (LOTREL) 5-20 MG capsule, TK 1 C PO QD, Disp: , Rfl:  .  aspirin 325 MG tablet, Take 325 mg by mouth daily., Disp: , Rfl:  .  atorvastatin (LIPITOR) 80 MG tablet, Take by mouth., Disp: , Rfl:  .  ATROVENT HFA 17 MCG/ACT inhaler, USE 2 PUFFS BID, Disp: , Rfl: 0 .  BD PEN NEEDLE MICRO U/F 32G X 6 MM MISC, SMARTSIG:1 Pre-Filled Pen Syringe SUB-Q Every Night, Disp: , Rfl:  .  benazepril (LOTENSIN) 10 MG tablet, Take by mouth., Disp: , Rfl:  .  benzonatate (TESSALON) 100 MG capsule, benzonatate 100 mg capsule, Disp: ,  Rfl:  .  Blood Glucose Monitoring Suppl (FREESTYLE LITE) DEVI, 2 (two) times daily. for testing, Disp: , Rfl: 0 .  butalbital-acetaminophen-caffeine (FIORICET, ESGIC) 50-325-40 MG per tablet, Take by mouth., Disp: , Rfl:  .  carvedilol (COREG) 25 MG tablet, Take by mouth., Disp: , Rfl:  .  clopidogrel (PLAVIX) 75 MG tablet, Take by mouth., Disp: , Rfl:  .  diphenoxylate-atropine (LOMOTIL) 2.5-0.025 MG tablet, diphenoxylate-atropine 2.5 mg-0.025 mg tablet, Disp: , Rfl:  .  fluconazole (DIFLUCAN) 200 MG tablet, fluconazole 200 mg tablet, Disp: , Rfl:  .  fluorometholone (FML) 0.1 % ophthalmic suspension, fluorometholone 0.1 % eye drops,suspension, Disp: , Rfl:  .  formoterol (PERFOROMIST) 20 MCG/2ML nebulizer solution, Inhale into the lungs., Disp: , Rfl:  .  FREESTYLE LITE test strip, TEST AS DIRECTED TWICE A DAY, Disp: , Rfl: 99 .  furosemide (LASIX) 20 MG tablet, Take by mouth., Disp: , Rfl:  .  gabapentin (NEURONTIN) 400 MG capsule, Take by mouth., Disp: , Rfl:  .  guaiFENesin-codeine (ROBITUSSIN AC) 100-10 MG/5ML syrup, Virtussin AC 10 mg-100 mg/5 mL oral liquid, Disp: , Rfl:  .  hydrALAZINE (APRESOLINE) 50 MG tablet, Take by mouth., Disp: , Rfl:  .  Influenza vac split quadrivalent PF (FLUZONE  HIGH-DOSE) 0.5 ML injection, Fluzone High-Dose 2018-2019 (PF) 180 mcg/0.5 mL intramuscular syringe  inject 0.5 milliliter intramuscularly, Disp: , Rfl:  .  insulin aspart (NOVOLOG) 100 UNIT/ML SOCT cartridge, Inject 30 Units into the skin once., Disp: , Rfl:  .  isosorbide mononitrate (IMDUR) 30 MG 24 hr tablet, Take by mouth., Disp: , Rfl:  .  ketoconazole (NIZORAL) 2 % cream, ketoconazole 2 % topical cream, Disp: , Rfl:  .  Lancets (FREESTYLE) lancets, TEST AS DIRECTED TWICE A DAY, Disp: , Rfl: 99 .  LANTUS SOLOSTAR 100 UNIT/ML Solostar Pen, , Disp: , Rfl:  .  levothyroxine (SYNTHROID, LEVOTHROID) 50 MCG tablet, Take by mouth., Disp: , Rfl:  .  lisinopril (PRINIVIL,ZESTRIL) 40 MG tablet, Take by  mouth., Disp: , Rfl:  .  metolazone (ZAROXOLYN) 2.5 MG tablet, , Disp: , Rfl:  .  Multiple Vitamin (MULTI-VITAMINS) TABS, Take by mouth., Disp: , Rfl:  .  nitroGLYCERIN (NITROSTAT) 0.4 MG SL tablet, Place under the tongue., Disp: , Rfl:  .  NON FORMULARY, Preformist 3ml/vial once Q12 hours, Disp: , Rfl:  .  omeprazole (PRILOSEC OTC) 20 MG tablet, Take by mouth., Disp: , Rfl:  .  Omeprazole 20 MG TBEC, Take by mouth., Disp: , Rfl:  .  pneumococcal 13-valent conjugate vaccine (PREVNAR 13) SUSP injection, Prevnar 13 (PF) 0.5 mL intramuscular syringe  inject 0.5 milliliter intramuscularly, Disp: , Rfl:  .  potassium chloride SA (K-DUR,KLOR-CON) 20 MEQ tablet, potassium chloride ER 20 mEq tablet,extended release(part/cryst), Disp: , Rfl:  .  ranolazine (RANEXA) 1000 MG SR tablet, Take by mouth., Disp: , Rfl:  .  simvastatin (ZOCOR) 80 MG tablet, Take by mouth., Disp: , Rfl:  .  traMADol (ULTRAM) 50 MG tablet, tramadol 50 mg tablet, Disp: , Rfl:  .  umeclidinium-vilanterol (ANORO ELLIPTA) 62.5-25 MCG/INH AEPB, Inhale into the lungs., Disp: , Rfl:  .  Vitamin D, Ergocalciferol, (DRISDOL) 50000 UNITS CAPS capsule, Take by mouth., Disp: , Rfl:  .  tiotropium (SPIRIVA HANDIHALER) 18 MCG inhalation capsule, Place into inhaler and inhale., Disp: , Rfl:   Social History   Tobacco Use  Smoking Status Never Smoker  Smokeless Tobacco Never Used    Allergies  Allergen Reactions  . Penicillins Swelling   Objective:  There were no vitals filed for this visit. There is no height or weight on file to calculate BMI. Constitutional Well developed. Well nourished.  Vascular Dorsalis pedis pulses palpable bilaterally. Posterior tibial pulses palpable bilaterally. Capillary refill normal to all digits.  No cyanosis or clubbing noted. Pedal hair growth normal.  Neurologic Normal speech. Oriented to person, place, and time. Epicritic sensation to light touch grossly present bilaterally.  Dermatologic  Nails well groomed and normal in appearance. No open wounds. No skin lesions.  Orthopedic:  Mild pain on palpation to the peroneal tendon at the level of the lateral malleoli.  Mild pain with eversion and inversion active and passive range of motion.  No pain at the ATFL the Achilles tendon posterior tibial tendon or the ankle joint.   Radiographs: None Assessment:   1. Peroneal tendinitis, left   2. Acute left ankle pain   3. Diabetic polyneuropathy associated with type 2 diabetes mellitus ([place])   4. Tendinitis    Plan:  Patient was evaluated and treated and all questions answered.  Left peroneal tendinitis -Given the patient had considerably improved with the steroid injection greater than 80%, I believe patient will benefit from another steroid injection to the same area/affected area  to help decrease the pain completely.  Patient agrees with the plan and would like to proceed with another steroid injection.  This will be the second injection.  I explained to the patient that I do not like to inject in the tendon given the high risk of rupture.  I plan on injecting at the point of maximal tenderness.  Patient wishes to proceed with injection -A steroid injection was performed at right lateral foot point of maximal tenderness using 1% plain Lidocaine and 10 mg of Kenalog. This was well tolerated.   No follow-ups on file.

## 2019-09-30 ENCOUNTER — Other Ambulatory Visit: Payer: Self-pay

## 2019-09-30 ENCOUNTER — Ambulatory Visit (INDEPENDENT_AMBULATORY_CARE_PROVIDER_SITE_OTHER): Payer: Medicare Other | Admitting: Podiatry

## 2019-09-30 ENCOUNTER — Other Ambulatory Visit: Payer: Self-pay | Admitting: Podiatry

## 2019-09-30 ENCOUNTER — Ambulatory Visit (INDEPENDENT_AMBULATORY_CARE_PROVIDER_SITE_OTHER): Payer: Medicare Other

## 2019-09-30 DIAGNOSIS — M7751 Other enthesopathy of right foot: Secondary | ICD-10-CM

## 2019-09-30 DIAGNOSIS — M7671 Peroneal tendinitis, right leg: Secondary | ICD-10-CM | POA: Diagnosis not present

## 2019-09-30 DIAGNOSIS — M79671 Pain in right foot: Secondary | ICD-10-CM | POA: Diagnosis not present

## 2019-09-30 DIAGNOSIS — M778 Other enthesopathies, not elsewhere classified: Secondary | ICD-10-CM | POA: Diagnosis not present

## 2019-09-30 DIAGNOSIS — E1142 Type 2 diabetes mellitus with diabetic polyneuropathy: Secondary | ICD-10-CM

## 2019-10-01 ENCOUNTER — Encounter: Payer: Self-pay | Admitting: Podiatry

## 2019-10-01 NOTE — Progress Notes (Signed)
Subjective:  Patient ID: Jacob Silva, male    DOB: 01-Jan-1932,  MRN: [id number]  Chief Complaint  Patient presents with  . Foot Pain    pt is here for right foot pain, pt states that pain has been going on since yesterday and states that pain is elevated when applying pressure    86 y.o. male presents with the above complaint.  Patient presents with a follow-up of left peroneal tendinitis.  Patient states the injection completely resolve the pain to the left side.  He has been able to ambulate with regular sneakers.  However patient now has a new complaint of right peroneal tendinitis that has started to cause him a lot of pain.  Patient has not been able to ambulate.  Patient placed himself in the cam boot that he was given for the left side.  Patient would like to know if he could get injection to the right side to help decrease the pain.  Pain scale is 5 out of 10.  He denies any other acute complaints.   Review of Systems: Negative except as noted in the HPI. Denies N/V/F/Ch.  Past Medical History:  Diagnosis Date  . Diabetes ([place])   . HBP (high blood pressure)     Current Outpatient Medications:  .  acetaminophen (TYLENOL) 325 MG tablet, Take by mouth., Disp: , Rfl:  .  albuterol (PROAIR HFA) 108 (90 BASE) MCG/ACT inhaler, , Disp: , Rfl:  .  Alcohol Swabs (ALCOHOL WIPES) 70 % PADS, IV Prep Wipes medicated, Disp: , Rfl:  .  allopurinol (ZYLOPRIM) 100 MG tablet, Take by mouth., Disp: , Rfl:  .  amLODipine-benazepril (LOTREL) 5-20 MG capsule, TK 1 C PO QD, Disp: , Rfl:  .  aspirin 325 MG tablet, Take 325 mg by mouth daily., Disp: , Rfl:  .  atorvastatin (LIPITOR) 80 MG tablet, Take by mouth., Disp: , Rfl:  .  ATROVENT HFA 17 MCG/ACT inhaler, USE 2 PUFFS BID, Disp: , Rfl: 0 .  BD PEN NEEDLE MICRO U/F 32G X 6 MM MISC, SMARTSIG:1 Pre-Filled Pen Syringe SUB-Q Every Night, Disp: , Rfl:  .  benazepril (LOTENSIN) 10 MG tablet, Take by mouth., Disp: , Rfl:  .  benzonatate (TESSALON) 100  MG capsule, benzonatate 100 mg capsule, Disp: , Rfl:  .  Blood Glucose Monitoring Suppl (FREESTYLE LITE) DEVI, 2 (two) times daily. for testing, Disp: , Rfl: 0 .  butalbital-acetaminophen-caffeine (FIORICET, ESGIC) 50-325-40 MG per tablet, Take by mouth., Disp: , Rfl:  .  carvedilol (COREG) 25 MG tablet, Take by mouth., Disp: , Rfl:  .  clopidogrel (PLAVIX) 75 MG tablet, Take by mouth., Disp: , Rfl:  .  diphenoxylate-atropine (LOMOTIL) 2.5-0.025 MG tablet, diphenoxylate-atropine 2.5 mg-0.025 mg tablet, Disp: , Rfl:  .  fluconazole (DIFLUCAN) 200 MG tablet, fluconazole 200 mg tablet, Disp: , Rfl:  .  fluorometholone (FML) 0.1 % ophthalmic suspension, fluorometholone 0.1 % eye drops,suspension, Disp: , Rfl:  .  formoterol (PERFOROMIST) 20 MCG/2ML nebulizer solution, Inhale into the lungs., Disp: , Rfl:  .  FREESTYLE LITE test strip, TEST AS DIRECTED TWICE A DAY, Disp: , Rfl: 99 .  furosemide (LASIX) 20 MG tablet, Take by mouth., Disp: , Rfl:  .  gabapentin (NEURONTIN) 400 MG capsule, Take by mouth., Disp: , Rfl:  .  guaiFENesin-codeine (ROBITUSSIN AC) 100-10 MG/5ML syrup, Virtussin AC 10 mg-100 mg/5 mL oral liquid, Disp: , Rfl:  .  hydrALAZINE (APRESOLINE) 50 MG tablet, Take by mouth., Disp: , Rfl:  .  Influenza vac split quadrivalent PF (FLUZONE HIGH-DOSE) 0.5 ML injection, Fluzone High-Dose 2018-2019 (PF) 180 mcg/0.5 mL intramuscular syringe  inject 0.5 milliliter intramuscularly, Disp: , Rfl:  .  insulin aspart (NOVOLOG) 100 UNIT/ML SOCT cartridge, Inject 30 Units into the skin once., Disp: , Rfl:  .  isosorbide mononitrate (IMDUR) 30 MG 24 hr tablet, Take by mouth., Disp: , Rfl:  .  ketoconazole (NIZORAL) 2 % cream, ketoconazole 2 % topical cream, Disp: , Rfl:  .  Lancets (FREESTYLE) lancets, TEST AS DIRECTED TWICE A DAY, Disp: , Rfl: 99 .  LANTUS SOLOSTAR 100 UNIT/ML Solostar Pen, , Disp: , Rfl:  .  levothyroxine (SYNTHROID, LEVOTHROID) 50 MCG tablet, Take by mouth., Disp: , Rfl:  .   lisinopril (PRINIVIL,ZESTRIL) 40 MG tablet, Take by mouth., Disp: , Rfl:  .  metolazone (ZAROXOLYN) 2.5 MG tablet, , Disp: , Rfl:  .  Multiple Vitamin (MULTI-VITAMINS) TABS, Take by mouth., Disp: , Rfl:  .  nitroGLYCERIN (NITROSTAT) 0.4 MG SL tablet, Place under the tongue., Disp: , Rfl:  .  NON FORMULARY, Preformist 61ml/vial once Q12 hours, Disp: , Rfl:  .  omeprazole (PRILOSEC OTC) 20 MG tablet, Take by mouth., Disp: , Rfl:  .  Omeprazole 20 MG TBEC, Take by mouth., Disp: , Rfl:  .  pneumococcal 13-valent conjugate vaccine (PREVNAR 13) SUSP injection, Prevnar 13 (PF) 0.5 mL intramuscular syringe  inject 0.5 milliliter intramuscularly, Disp: , Rfl:  .  potassium chloride SA (K-DUR,KLOR-CON) 20 MEQ tablet, potassium chloride ER 20 mEq tablet,extended release(part/cryst), Disp: , Rfl:  .  ranolazine (RANEXA) 1000 MG SR tablet, Take by mouth., Disp: , Rfl:  .  simvastatin (ZOCOR) 80 MG tablet, Take by mouth., Disp: , Rfl:  .  traMADol (ULTRAM) 50 MG tablet, tramadol 50 mg tablet, Disp: , Rfl:  .  umeclidinium-vilanterol (ANORO ELLIPTA) 62.5-25 MCG/INH AEPB, Inhale into the lungs., Disp: , Rfl:  .  Vitamin D, Ergocalciferol, (DRISDOL) 50000 UNITS CAPS capsule, Take by mouth., Disp: , Rfl:  .  tiotropium (SPIRIVA HANDIHALER) 18 MCG inhalation capsule, Place into inhaler and inhale., Disp: , Rfl:   Social History   Tobacco Use  Smoking Status Never Smoker  Smokeless Tobacco Never Used    Allergies  Allergen Reactions  . Penicillins Swelling   Objective:  There were no vitals filed for this visit. There is no height or weight on file to calculate BMI. Constitutional Well developed. Well nourished.  Vascular Dorsalis pedis pulses palpable bilaterally. Posterior tibial pulses palpable bilaterally. Capillary refill normal to all digits.  No cyanosis or clubbing noted. Pedal hair growth normal.  Neurologic Normal speech. Oriented to person, place, and time. Epicritic sensation to light  touch grossly present bilaterally.  Dermatologic Nails well groomed and normal in appearance. No open wounds. No skin lesions.  Orthopedic:  No pain on palpation to the left foot including the peroneal tendon .  right foot pain on palpation to the peroneal tendon at the level of the lateral malleoli.  Right foot pain with eversion and inversion active and passive range of motion.  No pain at the ATFL the Achilles tendon posterior tibial tendon or the ankle joint.   Radiographs: 3 views of skeletally mature right foot osteoarthritic changes noted at the midfoot first metatarsal elevatus noted.  Mild subtalar joint arthritic changes noted.  No other bony abnormalities identified. Assessment:   1. Foot pain, right    Plan:  Patient was evaluated and treated and all questions answered.  Left peroneal  tendinitis -Resolved with steroid injection x2.  Given that this has clinically improved.  Patient has been able to ambulate in regular sneakers.  I will hold off on any further injection to the left side.  Right peroneal tendinitis -I explained to the patient the etiology of peroneal tendinitis likely due to compensation from the left side and various treatment options associated with it.  I believe patient will benefit from a steroid injection.  Patient agrees with the plan and would like to proceed with a steroid injection.  I did explain to the patient that given that this is near the tendon there is a risk of rupture.  Patient was to like to proceed despite the risks. -A steroid injection was performed at right lateral foot a point of maximal tenderness using 1% plain Lidocaine and 10 mg of Kenalog. This was well tolerated. -Patient will be in a cam boot weightbearing as tolerated to immobilize the right side.    No follow-ups on file.

## 2019-10-04 ENCOUNTER — Ambulatory Visit (INDEPENDENT_AMBULATORY_CARE_PROVIDER_SITE_OTHER): Payer: Medicare Other | Admitting: Podiatry

## 2019-10-04 ENCOUNTER — Encounter: Payer: Self-pay | Admitting: Podiatry

## 2019-10-04 ENCOUNTER — Other Ambulatory Visit: Payer: Self-pay

## 2019-10-04 VITALS — Temp 97.9°F

## 2019-10-04 DIAGNOSIS — B351 Tinea unguium: Secondary | ICD-10-CM

## 2019-10-04 DIAGNOSIS — M79675 Pain in left toe(s): Secondary | ICD-10-CM | POA: Diagnosis not present

## 2019-10-04 DIAGNOSIS — M79674 Pain in right toe(s): Secondary | ICD-10-CM

## 2019-10-04 DIAGNOSIS — D689 Coagulation defect, unspecified: Secondary | ICD-10-CM

## 2019-10-04 DIAGNOSIS — E1142 Type 2 diabetes mellitus with diabetic polyneuropathy: Secondary | ICD-10-CM | POA: Diagnosis not present

## 2019-10-04 NOTE — Progress Notes (Signed)
This patient presents the office for an at risk foot care.  Patient requires his today by a professional for this patient will be at risk.  Patient is at risk due to coagulation disorder ESRD and DM.  Patient is presently taking Plavix.  Patient had tendinitis last week which was treated by Dr. Posey Pronto with a cam walker.  He says he is unable to trim his nails since he cannot reach his nails.  He presents the office today for at risk  foot care.  Patient is taking plavix.   General Appearance  Alert, conversant and in no acute stress.  Vascular  Dorsalis pedis and posterior tibial  pulses are palpable  bilaterally.  Capillary return is within normal limits  bilaterally. Temperature is within normal limits  bilaterally.  Neurologic  Senn-Weinstein monofilament wire test within normal limits right foot.  Absent  LOPS left foot. Muscle power within normal limits bilaterally.  Nails Thick disfigured discolored nails with subungual debris  from hallux to fifth toes bilaterally. No evidence of bacterial infection or drainage bilaterally.  Orthopedic  No limitations of motion  feet .  No crepitus or effusions noted.  No bony pathology or digital deformities noted.  Skin  normotropic skin with no porokeratosis noted bilaterally.  No signs of infections or ulcers noted.    Onychomycosis  B/L.  Pain in toes  B/L.     Debridement and grinding of long thick painful nails.  Told this patient the importance of periodic foot evaluation this will help reduce the potential complications of his feet.  RTC 3 months.   Gardiner Barefoot DPM

## 2019-10-12 ENCOUNTER — Ambulatory Visit: Payer: Medicare Other | Admitting: Podiatry

## 2019-10-28 ENCOUNTER — Other Ambulatory Visit: Payer: Self-pay

## 2019-10-28 ENCOUNTER — Ambulatory Visit (INDEPENDENT_AMBULATORY_CARE_PROVIDER_SITE_OTHER): Payer: Medicare Other | Admitting: Podiatry

## 2019-10-28 DIAGNOSIS — M778 Other enthesopathies, not elsewhere classified: Secondary | ICD-10-CM

## 2019-10-28 DIAGNOSIS — E1142 Type 2 diabetes mellitus with diabetic polyneuropathy: Secondary | ICD-10-CM

## 2019-10-28 DIAGNOSIS — M7671 Peroneal tendinitis, right leg: Secondary | ICD-10-CM

## 2019-11-01 ENCOUNTER — Encounter: Payer: Self-pay | Admitting: Podiatry

## 2019-11-01 NOTE — Progress Notes (Signed)
Subjective:  Patient ID: Jacob Silva, male    DOB: May 04, 1932,  MRN: [id number]  Chief Complaint  Patient presents with  . Nail Problem    pt is here for a f/u of nail fungus, and foot pain to the right foot, pt states it is painful when walking on it, and is looking to get it treated. Please advise.    86 y.o. male presents with the above complaint.  Patient presents with a follow-up of left peroneal tendinitis.  Patient states the injection completely helped.  Both of the peroneal tendinitis is completely resolved with injection.  He has not been ambulating with a cam boot.  He states he had a new onset of pain to the right first metatarsophalangeal joint.  He has been going from some quite some time now.  He has not done anything.  He would like to know if he can get another injection in that area.  He denies any other acute complaints.   Review of Systems: Negative except as noted in the HPI. Denies N/V/F/Ch.  Past Medical History:  Diagnosis Date  . Diabetes ([place])   . HBP (high blood pressure)     Current Outpatient Medications:  .  acetaminophen (TYLENOL) 325 MG tablet, Take by mouth., Disp: , Rfl:  .  albuterol (PROAIR HFA) 108 (90 BASE) MCG/ACT inhaler, , Disp: , Rfl:  .  Alcohol Swabs (ALCOHOL WIPES) 70 % PADS, IV Prep Wipes medicated, Disp: , Rfl:  .  allopurinol (ZYLOPRIM) 100 MG tablet, Take by mouth., Disp: , Rfl:  .  amLODipine-benazepril (LOTREL) 5-20 MG capsule, TK 1 C PO QD, Disp: , Rfl:  .  aspirin 325 MG tablet, Take 325 mg by mouth daily., Disp: , Rfl:  .  atorvastatin (LIPITOR) 80 MG tablet, Take by mouth., Disp: , Rfl:  .  ATROVENT HFA 17 MCG/ACT inhaler, USE 2 PUFFS BID, Disp: , Rfl: 0 .  BD PEN NEEDLE MICRO U/F 32G X 6 MM MISC, SMARTSIG:1 Pre-Filled Pen Syringe SUB-Q Every Night, Disp: , Rfl:  .  benazepril (LOTENSIN) 10 MG tablet, Take by mouth., Disp: , Rfl:  .  benzonatate (TESSALON) 100 MG capsule, benzonatate 100 mg capsule, Disp: , Rfl:  .  Blood  Glucose Monitoring Suppl (FREESTYLE LITE) DEVI, 2 (two) times daily. for testing, Disp: , Rfl: 0 .  butalbital-acetaminophen-caffeine (FIORICET, ESGIC) 50-325-40 MG per tablet, Take by mouth., Disp: , Rfl:  .  carvedilol (COREG) 25 MG tablet, Take by mouth., Disp: , Rfl:  .  clopidogrel (PLAVIX) 75 MG tablet, Take by mouth., Disp: , Rfl:  .  diphenoxylate-atropine (LOMOTIL) 2.5-0.025 MG tablet, diphenoxylate-atropine 2.5 mg-0.025 mg tablet, Disp: , Rfl:  .  fluconazole (DIFLUCAN) 200 MG tablet, fluconazole 200 mg tablet, Disp: , Rfl:  .  fluorometholone (FML) 0.1 % ophthalmic suspension, fluorometholone 0.1 % eye drops,suspension, Disp: , Rfl:  .  formoterol (PERFOROMIST) 20 MCG/2ML nebulizer solution, Inhale into the lungs., Disp: , Rfl:  .  FREESTYLE LITE test strip, TEST AS DIRECTED TWICE A DAY, Disp: , Rfl: 99 .  furosemide (LASIX) 20 MG tablet, Take by mouth., Disp: , Rfl:  .  gabapentin (NEURONTIN) 400 MG capsule, Take by mouth., Disp: , Rfl:  .  guaiFENesin-codeine (ROBITUSSIN AC) 100-10 MG/5ML syrup, Virtussin AC 10 mg-100 mg/5 mL oral liquid, Disp: , Rfl:  .  hydrALAZINE (APRESOLINE) 50 MG tablet, Take by mouth., Disp: , Rfl:  .  Influenza vac split quadrivalent PF (FLUZONE HIGH-DOSE) 0.5 ML injection, Fluzone  High-Dose 2018-2019 (PF) 180 mcg/0.5 mL intramuscular syringe  inject 0.5 milliliter intramuscularly, Disp: , Rfl:  .  insulin aspart (NOVOLOG) 100 UNIT/ML SOCT cartridge, Inject 30 Units into the skin once., Disp: , Rfl:  .  isosorbide mononitrate (IMDUR) 30 MG 24 hr tablet, Take by mouth., Disp: , Rfl:  .  ketoconazole (NIZORAL) 2 % cream, ketoconazole 2 % topical cream, Disp: , Rfl:  .  Lancets (FREESTYLE) lancets, TEST AS DIRECTED TWICE A DAY, Disp: , Rfl: 99 .  LANTUS SOLOSTAR 100 UNIT/ML Solostar Pen, , Disp: , Rfl:  .  levothyroxine (SYNTHROID, LEVOTHROID) 50 MCG tablet, Take by mouth., Disp: , Rfl:  .  lisinopril (PRINIVIL,ZESTRIL) 40 MG tablet, Take by mouth., Disp: , Rfl:   .  metolazone (ZAROXOLYN) 2.5 MG tablet, , Disp: , Rfl:  .  Multiple Vitamin (MULTI-VITAMINS) TABS, Take by mouth., Disp: , Rfl:  .  nitroGLYCERIN (NITROSTAT) 0.4 MG SL tablet, Place under the tongue., Disp: , Rfl:  .  NON FORMULARY, Preformist 86ml/vial once Q12 hours, Disp: , Rfl:  .  omeprazole (PRILOSEC OTC) 20 MG tablet, Take by mouth., Disp: , Rfl:  .  Omeprazole 20 MG TBEC, Take by mouth., Disp: , Rfl:  .  pneumococcal 13-valent conjugate vaccine (PREVNAR 13) SUSP injection, Prevnar 13 (PF) 0.5 mL intramuscular syringe  inject 0.5 milliliter intramuscularly, Disp: , Rfl:  .  potassium chloride SA (K-DUR,KLOR-CON) 20 MEQ tablet, potassium chloride ER 20 mEq tablet,extended release(part/cryst), Disp: , Rfl:  .  ranolazine (RANEXA) 1000 MG SR tablet, Take by mouth., Disp: , Rfl:  .  simvastatin (ZOCOR) 80 MG tablet, Take by mouth., Disp: , Rfl:  .  traMADol (ULTRAM) 50 MG tablet, tramadol 50 mg tablet, Disp: , Rfl:  .  umeclidinium-vilanterol (ANORO ELLIPTA) 62.5-25 MCG/INH AEPB, Inhale into the lungs., Disp: , Rfl:  .  Vitamin D, Ergocalciferol, (DRISDOL) 50000 UNITS CAPS capsule, Take by mouth., Disp: , Rfl:  .  tiotropium (SPIRIVA HANDIHALER) 18 MCG inhalation capsule, Place into inhaler and inhale., Disp: , Rfl:   Social History   Tobacco Use  Smoking Status Never Smoker  Smokeless Tobacco Never Used    Allergies  Allergen Reactions  . Penicillins Swelling   Objective:  There were no vitals filed for this visit. There is no height or weight on file to calculate BMI. Constitutional Well developed. Well nourished.  Vascular Dorsalis pedis pulses palpable bilaterally. Posterior tibial pulses palpable bilaterally. Capillary refill normal to all digits.  No cyanosis or clubbing noted. Pedal hair growth normal.  Neurologic Normal speech. Oriented to person, place, and time. Epicritic sensation to light touch grossly present bilaterally.  Dermatologic Nails well groomed and  normal in appearance. No open wounds. No skin lesions.  Orthopedic:  No pain on palpation to the left foot including the peroneal tendon .  Now right foot pain on palpation to the peroneal tendon at the level of the lateral malleoli.  No right foot pain with eversion and inversion active and passive range of motion.  No pain at the ATFL the Achilles tendon posterior tibial tendon or the ankle joint.  Right first metatarsophalangeal joint range of motion pain.  Intra-articular pain noted.  Range of motion actively and passively pain associated with it.  Pain on palpation.  No pain at the second metatarsophalangeal joint.  No pain with extensor and tendon flexor range of motion.   Radiographs: None Assessment:   1. Diabetic polyneuropathy associated with type 2 diabetes mellitus ([place])  2. Peroneal tendinitis of lower leg, right   3. Capsulitis of right foot    Plan:  Patient was evaluated and treated and all questions answered.  Right first metatarsophalangeal joint arthritis -I explained to the patient the etiology of arthritis and various treatment options were discussed with the patient.  Given that patient has had success with steroid injection in other parts of the foot I believe he will benefit from a steroid injection to the first metatarsophalangeal joint.  Patient agrees with the plan would like to proceed with injection. -A steroid injection was performed at right first metatarsophalangeal joint using 1% plain Lidocaine and 10 mg of Kenalog. This was well tolerated.  Left peroneal tendinitis -Resolved with steroid injection x2.  Given that this has clinically improved.  Patient has been able to ambulate in regular sneakers.  I will hold off on any further injection to the left side.  Right peroneal tendinitis -Resolved with 1 injection.    No follow-ups on file.

## 2019-12-16 ENCOUNTER — Other Ambulatory Visit: Payer: Self-pay

## 2019-12-16 ENCOUNTER — Ambulatory Visit: Payer: Medicare Other | Admitting: Podiatry

## 2019-12-21 ENCOUNTER — Encounter: Payer: Self-pay | Admitting: Podiatry

## 2019-12-21 ENCOUNTER — Ambulatory Visit (INDEPENDENT_AMBULATORY_CARE_PROVIDER_SITE_OTHER): Payer: Medicare Other | Admitting: Podiatry

## 2019-12-21 ENCOUNTER — Other Ambulatory Visit: Payer: Self-pay

## 2019-12-21 DIAGNOSIS — E1142 Type 2 diabetes mellitus with diabetic polyneuropathy: Secondary | ICD-10-CM

## 2019-12-21 DIAGNOSIS — M778 Other enthesopathies, not elsewhere classified: Secondary | ICD-10-CM | POA: Diagnosis not present

## 2019-12-23 NOTE — Progress Notes (Signed)
   HPI: 86 y.o. male with PMHx of T2DM presenting today with a chief complaint of swelling to the left foot that started about one week ago. He reports associated pain. He states he has had injections in the past which have helped alleviate his symptoms. He is also inquiring about diabetic shoes. Patient is here for further evaluation and treatment.   Past Medical History:  Diagnosis Date  . Diabetes ([place])   . HBP (high blood pressure)      Physical Exam: General: The patient is alert and oriented x3 in no acute distress.  Dermatology: Skin is warm, dry and supple bilateral lower extremities. Negative for open lesions or macerations.  Vascular: Palpable pedal pulses bilaterally. No edema or erythema noted. Capillary refill within normal limits.  Neurological: Epicritic and protective threshold diminished bilaterally.   Musculoskeletal Exam: Pain with palpation noted to the left foot. Range of motion within normal limits to all pedal and ankle joints bilateral. Muscle strength 5/5 in all groups bilateral.   Assessment: 1. Left foot capsulitis  2. T2DM with peripheral polyneuropathy    Plan of Care:  1. Patient evaluated.  2. Injection of 0.5 mLs [place] Soluspan injected into the left foot.  3. Appointment with Pedorthist for DM shoes and insoles.  4. Return to clinic as needed.      Edrick Kins, DPM [facility]  Dr. Edrick Kins, DPM    [address],  [address]                Office [phone]  Fax [phone]

## 2019-12-31 ENCOUNTER — Ambulatory Visit (INDEPENDENT_AMBULATORY_CARE_PROVIDER_SITE_OTHER): Payer: Medicare Other | Admitting: Orthotics

## 2019-12-31 ENCOUNTER — Other Ambulatory Visit: Payer: Self-pay

## 2019-12-31 DIAGNOSIS — E1142 Type 2 diabetes mellitus with diabetic polyneuropathy: Secondary | ICD-10-CM | POA: Diagnosis not present

## 2019-12-31 DIAGNOSIS — M778 Other enthesopathies, not elsewhere classified: Secondary | ICD-10-CM | POA: Diagnosis not present

## 2019-12-31 DIAGNOSIS — M7671 Peroneal tendinitis, right leg: Secondary | ICD-10-CM

## 2019-12-31 NOTE — Progress Notes (Addendum)

## 2020-01-06 ENCOUNTER — Ambulatory Visit: Payer: Medicare Other | Admitting: Podiatry

## 2020-01-10 ENCOUNTER — Other Ambulatory Visit: Payer: Self-pay

## 2020-01-10 ENCOUNTER — Ambulatory Visit (INDEPENDENT_AMBULATORY_CARE_PROVIDER_SITE_OTHER): Payer: Medicare Other | Admitting: Podiatry

## 2020-01-10 ENCOUNTER — Encounter: Payer: Self-pay | Admitting: Podiatry

## 2020-01-10 DIAGNOSIS — D689 Coagulation defect, unspecified: Secondary | ICD-10-CM

## 2020-01-10 DIAGNOSIS — M79675 Pain in left toe(s): Secondary | ICD-10-CM | POA: Diagnosis not present

## 2020-01-10 DIAGNOSIS — B351 Tinea unguium: Secondary | ICD-10-CM

## 2020-01-10 DIAGNOSIS — M79674 Pain in right toe(s): Secondary | ICD-10-CM

## 2020-01-10 DIAGNOSIS — E1142 Type 2 diabetes mellitus with diabetic polyneuropathy: Secondary | ICD-10-CM | POA: Diagnosis not present

## 2020-01-10 NOTE — Progress Notes (Signed)
This patient presents the office for an at risk foot care.  Patient requires his today by a professional for this patient will be at risk.  Patient is at risk due to coagulation disorder ESRD and DM.  Patient is presently taking Plavix.    He says he is unable to trim his nails since he cannot reach his nails.  He presents the office today for at risk  foot care.  Patient is taking plavix.   General Appearance  Alert, conversant and in no acute stress.  Vascular  Dorsalis pedis and posterior tibial  pulses are palpable  bilaterally.  Capillary return is within normal limits  bilaterally. Temperature is within normal limits  bilaterally.  Neurologic  Senn-Weinstein monofilament wire test within normal limits right foot.  Absent  LOPS left foot. Muscle power within normal limits bilaterally.  Nails Thick disfigured discolored nails with subungual debris  from hallux to fifth toes bilaterally. No evidence of bacterial infection or drainage bilaterally.  Orthopedic  No limitations of motion  feet .  No crepitus or effusions noted.  No bony pathology or digital deformities noted.  Skin  normotropic skin with no porokeratosis noted bilaterally.  No signs of infections or ulcers noted.    Onychomycosis  B/L.  Pain in toes  B/L.     Debridement and grinding of long thick painful nails.  Told this patient the importance of periodic foot evaluation this will help reduce the potential complications of his feet.  RTC 3 months.   Gardiner Barefoot DPM

## 2020-03-14 ENCOUNTER — Ambulatory Visit (INDEPENDENT_AMBULATORY_CARE_PROVIDER_SITE_OTHER): Payer: Medicare Other | Admitting: Podiatry

## 2020-03-14 ENCOUNTER — Encounter: Payer: Self-pay | Admitting: Podiatry

## 2020-03-14 ENCOUNTER — Other Ambulatory Visit: Payer: Self-pay

## 2020-03-14 DIAGNOSIS — M7751 Other enthesopathy of right foot: Secondary | ICD-10-CM

## 2020-03-14 DIAGNOSIS — E1142 Type 2 diabetes mellitus with diabetic polyneuropathy: Secondary | ICD-10-CM

## 2020-03-14 DIAGNOSIS — Z8739 Personal history of other diseases of the musculoskeletal system and connective tissue: Secondary | ICD-10-CM

## 2020-03-14 NOTE — Progress Notes (Signed)
Subjective:  Patient ID: Jacob Silva, male    DOB: June 11, 1932,  MRN: [id number]  Chief Complaint  Patient presents with  . Gout    Patient presents today for gout flare up in right ankle x 3 days    86 y.o. male presents with the above complaint.  Patient presents with a pain in the right ankle likely secondary to gout flare.  Patient states it started 3 days ago as progressing or worse.  He has not been able to ambulate properly.  He is has a history of gout for which he has received injection of the patch which has helped considerably.  He denies any other acute complaints.  He would like to get another injection.   Review of Systems: Negative except as noted in the HPI. Denies N/V/F/Ch.  Past Medical History:  Diagnosis Date  . Diabetes ([place])   . HBP (high blood pressure)     Current Outpatient Medications:  .  acetaminophen (TYLENOL) 325 MG tablet, Take by mouth., Disp: , Rfl:  .  albuterol (PROAIR HFA) 108 (90 BASE) MCG/ACT inhaler, , Disp: , Rfl:  .  Alcohol Swabs (ALCOHOL WIPES) 70 % PADS, IV Prep Wipes medicated, Disp: , Rfl:  .  allopurinol (ZYLOPRIM) 100 MG tablet, Take by mouth., Disp: , Rfl:  .  amLODipine-benazepril (LOTREL) 5-20 MG capsule, TK 1 C PO QD, Disp: , Rfl:  .  aspirin 325 MG tablet, Take 325 mg by mouth daily., Disp: , Rfl:  .  atorvastatin (LIPITOR) 80 MG tablet, Take by mouth., Disp: , Rfl:  .  ATROVENT HFA 17 MCG/ACT inhaler, USE 2 PUFFS BID, Disp: , Rfl: 0 .  BD PEN NEEDLE MICRO U/F 32G X 6 MM MISC, SMARTSIG:1 Pre-Filled Pen Syringe SUB-Q Every Night, Disp: , Rfl:  .  benazepril (LOTENSIN) 10 MG tablet, Take by mouth., Disp: , Rfl:  .  benazepril (LOTENSIN) 5 MG tablet, Take 5 mg by mouth daily., Disp: , Rfl:  .  benzonatate (TESSALON) 100 MG capsule, benzonatate 100 mg capsule, Disp: , Rfl:  .  Blood Glucose Monitoring Suppl (FREESTYLE LITE) DEVI, 2 (two) times daily. for testing, Disp: , Rfl: 0 .  butalbital-acetaminophen-caffeine (FIORICET,  ESGIC) 50-325-40 MG per tablet, Take by mouth., Disp: , Rfl:  .  carvedilol (COREG) 25 MG tablet, Take by mouth., Disp: , Rfl:  .  clopidogrel (PLAVIX) 75 MG tablet, Take by mouth., Disp: , Rfl:  .  diphenoxylate-atropine (LOMOTIL) 2.5-0.025 MG tablet, diphenoxylate-atropine 2.5 mg-0.025 mg tablet, Disp: , Rfl:  .  fluconazole (DIFLUCAN) 200 MG tablet, fluconazole 200 mg tablet, Disp: , Rfl:  .  fluorometholone (FML) 0.1 % ophthalmic suspension, fluorometholone 0.1 % eye drops,suspension, Disp: , Rfl:  .  formoterol (PERFOROMIST) 20 MCG/2ML nebulizer solution, Inhale into the lungs., Disp: , Rfl:  .  FREESTYLE LITE test strip, TEST AS DIRECTED TWICE A DAY, Disp: , Rfl: 99 .  furosemide (LASIX) 20 MG tablet, Take by mouth., Disp: , Rfl:  .  gabapentin (NEURONTIN) 400 MG capsule, Take by mouth., Disp: , Rfl:  .  guaiFENesin-codeine (ROBITUSSIN AC) 100-10 MG/5ML syrup, Virtussin AC 10 mg-100 mg/5 mL oral liquid, Disp: , Rfl:  .  hydrALAZINE (APRESOLINE) 50 MG tablet, Take by mouth., Disp: , Rfl:  .  Influenza vac split quadrivalent PF (FLUZONE HIGH-DOSE) 0.5 ML injection, Fluzone High-Dose 2018-2019 (PF) 180 mcg/0.5 mL intramuscular syringe  inject 0.5 milliliter intramuscularly, Disp: , Rfl:  .  insulin aspart (NOVOLOG) 100 UNIT/ML SOCT cartridge,  Inject 30 Units into the skin once., Disp: , Rfl:  .  isosorbide mononitrate (IMDUR) 30 MG 24 hr tablet, Take by mouth., Disp: , Rfl:  .  ketoconazole (NIZORAL) 2 % cream, ketoconazole 2 % topical cream, Disp: , Rfl:  .  Lancets (FREESTYLE) lancets, TEST AS DIRECTED TWICE A DAY, Disp: , Rfl: 99 .  LANTUS SOLOSTAR 100 UNIT/ML Solostar Pen, , Disp: , Rfl:  .  levothyroxine (SYNTHROID, LEVOTHROID) 50 MCG tablet, Take by mouth., Disp: , Rfl:  .  lisinopril (PRINIVIL,ZESTRIL) 40 MG tablet, Take by mouth., Disp: , Rfl:  .  loperamide (IMODIUM) 2 MG capsule, Take by mouth., Disp: , Rfl:  .  metolazone (ZAROXOLYN) 2.5 MG tablet, , Disp: , Rfl:  .  Multiple  Vitamin (MULTI-VITAMINS) TABS, Take by mouth., Disp: , Rfl:  .  nitroGLYCERIN (NITROSTAT) 0.4 MG SL tablet, Place under the tongue., Disp: , Rfl:  .  NON FORMULARY, Preformist 84ml/vial once Q12 hours, Disp: , Rfl:  .  omeprazole (PRILOSEC OTC) 20 MG tablet, Take by mouth., Disp: , Rfl:  .  Omeprazole 20 MG TBEC, Take by mouth., Disp: , Rfl:  .  ondansetron (ZOFRAN) 8 MG tablet, Take 8 mg by mouth every 8 (eight) hours as needed., Disp: , Rfl:  .  pneumococcal 13-valent conjugate vaccine (PREVNAR 13) SUSP injection, Prevnar 13 (PF) 0.5 mL intramuscular syringe  inject 0.5 milliliter intramuscularly, Disp: , Rfl:  .  potassium chloride SA (K-DUR,KLOR-CON) 20 MEQ tablet, potassium chloride ER 20 mEq tablet,extended release(part/cryst), Disp: , Rfl:  .  ranolazine (RANEXA) 1000 MG SR tablet, Take by mouth., Disp: , Rfl:  .  simvastatin (ZOCOR) 80 MG tablet, Take by mouth., Disp: , Rfl:  .  tiotropium (SPIRIVA HANDIHALER) 18 MCG inhalation capsule, Place into inhaler and inhale., Disp: , Rfl:  .  traMADol (ULTRAM) 50 MG tablet, tramadol 50 mg tablet, Disp: , Rfl:  .  umeclidinium-vilanterol (ANORO ELLIPTA) 62.5-25 MCG/INH AEPB, Inhale into the lungs., Disp: , Rfl:  .  Vitamin D, Ergocalciferol, (DRISDOL) 50000 UNITS CAPS capsule, Take by mouth., Disp: , Rfl:   Social History   Tobacco Use  Smoking Status Never Smoker  Smokeless Tobacco Never Used    Allergies  Allergen Reactions  . Penicillins Swelling   Objective:  There were no vitals filed for this visit. There is no height or weight on file to calculate BMI. Constitutional Well developed. Well nourished.  Vascular Dorsalis pedis pulses palpable bilaterally. Posterior tibial pulses palpable bilaterally. Capillary refill normal to all digits.  No cyanosis or clubbing noted. Pedal hair growth normal.  Neurologic Normal speech. Oriented to person, place, and time. Epicritic sensation to light touch grossly present bilaterally.   Dermatologic Nails well groomed and normal in appearance. No open wounds. No skin lesions.  Orthopedic:  Pain on palpation right medial gutter of the ankle joint.  Pain with range of motion.  Intra-articular pain noted.  No crepitus palpated.  No pain at the Achilles tendon, peroneal tendon, posterior tibial tendon, ATFL ligament.   Radiographs: None Assessment:   1. Diabetic polyneuropathy associated with type 2 diabetes mellitus (HCC)   2. Capsulitis of ankle, right   3. History of gout    Plan:  Patient was evaluated and treated and all questions answered.  Right ankle capsulitis secondary to gout flare -I explained patient the etiology of capsulitis and various treatment options were extensively discussed.  Given that patient has a history of gout this is likely due to  the gout flare in the ankle joint.  I have asked the patient to continue monitoring the gout.  I believe he will benefit from steroid injection.  Patient agrees with the plan like to proceed with a steroid injection.  Also discussed with the patient and educated him on gout management and medication.  I discussed with him the importance of diet control without red meat or excessive amount of fish.  Patient states understanding and will try to do better with the diet control. -A steroid injection was performed at right ankle using 1% plain Lidocaine and 10 mg of Kenalog. This was well tolerated.   No follow-ups on file.

## 2020-03-16 ENCOUNTER — Other Ambulatory Visit: Payer: Self-pay

## 2020-03-16 ENCOUNTER — Ambulatory Visit (INDEPENDENT_AMBULATORY_CARE_PROVIDER_SITE_OTHER): Payer: Medicare Other | Admitting: Podiatry

## 2020-03-16 ENCOUNTER — Encounter: Payer: Self-pay | Admitting: Podiatry

## 2020-03-16 DIAGNOSIS — M7751 Other enthesopathy of right foot: Secondary | ICD-10-CM

## 2020-03-16 DIAGNOSIS — M76821 Posterior tibial tendinitis, right leg: Secondary | ICD-10-CM

## 2020-03-16 DIAGNOSIS — E1142 Type 2 diabetes mellitus with diabetic polyneuropathy: Secondary | ICD-10-CM

## 2020-03-17 ENCOUNTER — Encounter: Payer: Self-pay | Admitting: Podiatry

## 2020-03-17 NOTE — Progress Notes (Signed)
Subjective:  Patient ID: Jacob Silva, male    DOB: [birth year],  MRN: [id number]  Chief Complaint  Patient presents with  . Foot Pain    "the shot was no help, Im in so much pain"    86 y.o. male presents with the above complaint.  Patient presents with a follow-up of right ankle capsulitis as well as now new onset of right posterior tibial tendinitis.  Patient states the injection did not help at all.  He would like to try another injection as sometimes it takes more than 2 injection to counter get the gout flare under control.  He denies any other acute complaints.   Review of Systems: Negative except as noted in the HPI. Denies N/V/F/Ch.  Past Medical History:  Diagnosis Date  . Diabetes ([place])   . HBP (high blood pressure)     Current Outpatient Medications:  .  acetaminophen (TYLENOL) 325 MG tablet, Take by mouth., Disp: , Rfl:  .  albuterol (PROAIR HFA) 108 (90 BASE) MCG/ACT inhaler, , Disp: , Rfl:  .  Alcohol Swabs (ALCOHOL WIPES) 70 % PADS, IV Prep Wipes medicated, Disp: , Rfl:  .  allopurinol (ZYLOPRIM) 100 MG tablet, Take by mouth., Disp: , Rfl:  .  amLODipine-benazepril (LOTREL) 5-20 MG capsule, TK 1 C PO QD, Disp: , Rfl:  .  aspirin 325 MG tablet, Take 325 mg by mouth daily., Disp: , Rfl:  .  atorvastatin (LIPITOR) 80 MG tablet, Take by mouth., Disp: , Rfl:  .  ATROVENT HFA 17 MCG/ACT inhaler, USE 2 PUFFS BID, Disp: , Rfl: 0 .  BD PEN NEEDLE MICRO U/F 32G X 6 MM MISC, SMARTSIG:1 Pre-Filled Pen Syringe SUB-Q Every Night, Disp: , Rfl:  .  benazepril (LOTENSIN) 10 MG tablet, Take by mouth., Disp: , Rfl:  .  benazepril (LOTENSIN) 5 MG tablet, Take 5 mg by mouth daily., Disp: , Rfl:  .  benzonatate (TESSALON) 100 MG capsule, benzonatate 100 mg capsule, Disp: , Rfl:  .  Blood Glucose Monitoring Suppl (FREESTYLE LITE) DEVI, 2 (two) times daily. for testing, Disp: , Rfl: 0 .  butalbital-acetaminophen-caffeine (FIORICET, ESGIC) 50-325-40 MG per tablet, Take by mouth., Disp: ,  Rfl:  .  carvedilol (COREG) 25 MG tablet, Take by mouth., Disp: , Rfl:  .  clopidogrel (PLAVIX) 75 MG tablet, Take by mouth., Disp: , Rfl:  .  diphenoxylate-atropine (LOMOTIL) 2.5-0.025 MG tablet, diphenoxylate-atropine 2.5 mg-0.025 mg tablet, Disp: , Rfl:  .  fluconazole (DIFLUCAN) 200 MG tablet, fluconazole 200 mg tablet, Disp: , Rfl:  .  fluorometholone (FML) 0.1 % ophthalmic suspension, fluorometholone 0.1 % eye drops,suspension, Disp: , Rfl:  .  formoterol (PERFOROMIST) 20 MCG/2ML nebulizer solution, Inhale into the lungs., Disp: , Rfl:  .  FREESTYLE LITE test strip, TEST AS DIRECTED TWICE A DAY, Disp: , Rfl: 99 .  furosemide (LASIX) 20 MG tablet, Take by mouth., Disp: , Rfl:  .  gabapentin (NEURONTIN) 400 MG capsule, Take by mouth., Disp: , Rfl:  .  guaiFENesin-codeine (ROBITUSSIN AC) 100-10 MG/5ML syrup, Virtussin AC 10 mg-100 mg/5 mL oral liquid, Disp: , Rfl:  .  hydrALAZINE (APRESOLINE) 50 MG tablet, Take by mouth., Disp: , Rfl:  .  Influenza vac split quadrivalent PF (FLUZONE HIGH-DOSE) 0.5 ML injection, Fluzone High-Dose 2018-2019 (PF) 180 mcg/0.5 mL intramuscular syringe  inject 0.5 milliliter intramuscularly, Disp: , Rfl:  .  insulin aspart (NOVOLOG) 100 UNIT/ML SOCT cartridge, Inject 30 Units into the skin once., Disp: , Rfl:  .  isosorbide mononitrate (IMDUR) 30 MG 24 hr tablet, Take by mouth., Disp: , Rfl:  .  ketoconazole (NIZORAL) 2 % cream, ketoconazole 2 % topical cream, Disp: , Rfl:  .  Lancets (FREESTYLE) lancets, TEST AS DIRECTED TWICE A DAY, Disp: , Rfl: 99 .  LANTUS SOLOSTAR 100 UNIT/ML Solostar Pen, , Disp: , Rfl:  .  levothyroxine (SYNTHROID, LEVOTHROID) 50 MCG tablet, Take by mouth., Disp: , Rfl:  .  lisinopril (PRINIVIL,ZESTRIL) 40 MG tablet, Take by mouth., Disp: , Rfl:  .  loperamide (IMODIUM) 2 MG capsule, Take by mouth., Disp: , Rfl:  .  metolazone (ZAROXOLYN) 2.5 MG tablet, , Disp: , Rfl:  .  Multiple Vitamin (MULTI-VITAMINS) TABS, Take by mouth., Disp: , Rfl:   .  nitroGLYCERIN (NITROSTAT) 0.4 MG SL tablet, Place under the tongue., Disp: , Rfl:  .  NON FORMULARY, Preformist 68ml/vial once Q12 hours, Disp: , Rfl:  .  omeprazole (PRILOSEC OTC) 20 MG tablet, Take by mouth., Disp: , Rfl:  .  Omeprazole 20 MG TBEC, Take by mouth., Disp: , Rfl:  .  ondansetron (ZOFRAN) 8 MG tablet, Take 8 mg by mouth every 8 (eight) hours as needed., Disp: , Rfl:  .  pneumococcal 13-valent conjugate vaccine (PREVNAR 13) SUSP injection, Prevnar 13 (PF) 0.5 mL intramuscular syringe  inject 0.5 milliliter intramuscularly, Disp: , Rfl:  .  potassium chloride SA (K-DUR,KLOR-CON) 20 MEQ tablet, potassium chloride ER 20 mEq tablet,extended release(part/cryst), Disp: , Rfl:  .  ranolazine (RANEXA) 1000 MG SR tablet, Take by mouth., Disp: , Rfl:  .  simvastatin (ZOCOR) 80 MG tablet, Take by mouth., Disp: , Rfl:  .  tiotropium (SPIRIVA HANDIHALER) 18 MCG inhalation capsule, Place into inhaler and inhale., Disp: , Rfl:  .  traMADol (ULTRAM) 50 MG tablet, tramadol 50 mg tablet, Disp: , Rfl:  .  umeclidinium-vilanterol (ANORO ELLIPTA) 62.5-25 MCG/INH AEPB, Inhale into the lungs., Disp: , Rfl:  .  Vitamin D, Ergocalciferol, (DRISDOL) 50000 UNITS CAPS capsule, Take by mouth., Disp: , Rfl:   Social History   Tobacco Use  Smoking Status Never Smoker  Smokeless Tobacco Never Used    Allergies  Allergen Reactions  . Penicillins Swelling   Objective:  There were no vitals filed for this visit. There is no height or weight on file to calculate BMI. Constitutional Well developed. Well nourished.  Vascular Dorsalis pedis pulses palpable bilaterally. Posterior tibial pulses palpable bilaterally. Capillary refill normal to all digits.  No cyanosis or clubbing noted. Pedal hair growth normal.  Neurologic Normal speech. Oriented to person, place, and time. Epicritic sensation to light touch grossly present bilaterally.  Dermatologic Nails well groomed and normal in appearance. No  open wounds. No skin lesions.  Orthopedic:  Pain on palpation right medial gutter of the ankle joint.  Pain with range of motion.  Intra-articular pain noted.  No crepitus palpated.  No pain at the Achilles tendon, peroneal tendon,  ATFL ligament.  Pain on palpation right posterior tibial tendon insertion.  No pain along the course of the tendon..  Pain with resisted inversion and plantarflexion of the foot.   Radiographs: None Assessment:   1. Diabetic polyneuropathy associated with type 2 diabetes mellitus (HCC)   2. Capsulitis of ankle, right   3. Posterior tibial tendinitis, right    Plan:  Patient was evaluated and treated and all questions answered.  Right ankle capsulitis secondary to gout flare -I explained patient the etiology of capsulitis and various treatment options were extensively discussed.  Given that patient has a history of gout this is likely due to the gout flare in the ankle joint.  I have asked the patient to continue monitoring the gout.  I believe benefit from a second steroid injection.  It appears that patient injections are not lasting as long..  Patient agrees with the plan like to proceed with a steroid injection.  Also discussed with the patient and educated him on gout management and medication.  I discussed with him the importance of diet control without red meat or excessive amount of fish.  Patient states understanding and will try to do better with the diet control. -A second steroid injection was performed at right ankle using 1% plain Lidocaine and 10 mg of Kenalog. This was well tolerated.  Right posterior tibial tendinitis -I explained to patient the etiology of posterior tibial tendinitis and various treatment options were discussed.  Given that he is having pain right at the insertion of the posterior tibial I believe he will benefit from a steroid injection help decrease some of the acute inflammatory component associated pain.  Patient agrees with the  plan like to proceed with a steroid injection. -A steroid injection was performed at Right medial foot using 1% plain Lidocaine and 10 mg of Kenalog. This was well tolerated.    No follow-ups on file.

## 2020-04-04 ENCOUNTER — Encounter: Payer: Self-pay | Admitting: Podiatry

## 2020-04-04 ENCOUNTER — Other Ambulatory Visit: Payer: Self-pay

## 2020-04-04 ENCOUNTER — Ambulatory Visit (INDEPENDENT_AMBULATORY_CARE_PROVIDER_SITE_OTHER): Payer: Medicare Other | Admitting: Podiatry

## 2020-04-04 DIAGNOSIS — E1142 Type 2 diabetes mellitus with diabetic polyneuropathy: Secondary | ICD-10-CM | POA: Diagnosis not present

## 2020-04-04 DIAGNOSIS — M7751 Other enthesopathy of right foot: Secondary | ICD-10-CM

## 2020-04-04 NOTE — Progress Notes (Signed)
Subjective:  Patient ID: Jacob Silva, male    DOB: 11-Feb-1932,  MRN: [id number]  Chief Complaint  Patient presents with  . Ankle Pain    Patient presents today for flare up of right ankle which started yesterday.    86 y.o. male presents with the above complaint.  Patient presents with a follow-up of right ankle capsulitis secondary to gout flare.  Patient states injection did help last time.  He would like another injection in the area.  He denies any other acute complaints.  He states allopurinol is not helping.  He states colchicine helps a little but has been taking them.  Ts.   Review of Systems: Negative except as noted in the HPI. Denies N/V/F/Ch.  Past Medical History:  Diagnosis Date  . Diabetes ([place])   . HBP (high blood pressure)     Current Outpatient Medications:  .  acetaminophen (TYLENOL) 325 MG tablet, Take by mouth., Disp: , Rfl:  .  albuterol (PROAIR HFA) 108 (90 BASE) MCG/ACT inhaler, , Disp: , Rfl:  .  Alcohol Swabs (ALCOHOL WIPES) 70 % PADS, IV Prep Wipes medicated, Disp: , Rfl:  .  allopurinol (ZYLOPRIM) 100 MG tablet, Take by mouth., Disp: , Rfl:  .  amLODipine-benazepril (LOTREL) 5-20 MG capsule, TK 1 C PO QD, Disp: , Rfl:  .  aspirin 325 MG tablet, Take 325 mg by mouth daily., Disp: , Rfl:  .  atorvastatin (LIPITOR) 80 MG tablet, Take by mouth., Disp: , Rfl:  .  ATROVENT HFA 17 MCG/ACT inhaler, USE 2 PUFFS BID, Disp: , Rfl: 0 .  BD PEN NEEDLE MICRO U/F 32G X 6 MM MISC, SMARTSIG:1 Pre-Filled Pen Syringe SUB-Q Every Night, Disp: , Rfl:  .  benazepril (LOTENSIN) 10 MG tablet, Take by mouth., Disp: , Rfl:  .  benazepril (LOTENSIN) 5 MG tablet, Take 5 mg by mouth daily., Disp: , Rfl:  .  benzonatate (TESSALON) 100 MG capsule, benzonatate 100 mg capsule, Disp: , Rfl:  .  Blood Glucose Monitoring Suppl (FREESTYLE LITE) DEVI, 2 (two) times daily. for testing, Disp: , Rfl: 0 .  butalbital-acetaminophen-caffeine (FIORICET, ESGIC) 50-325-40 MG per tablet, Take by  mouth., Disp: , Rfl:  .  carvedilol (COREG) 25 MG tablet, Take by mouth., Disp: , Rfl:  .  clopidogrel (PLAVIX) 75 MG tablet, Take by mouth., Disp: , Rfl:  .  diphenoxylate-atropine (LOMOTIL) 2.5-0.025 MG tablet, diphenoxylate-atropine 2.5 mg-0.025 mg tablet, Disp: , Rfl:  .  fluconazole (DIFLUCAN) 200 MG tablet, fluconazole 200 mg tablet, Disp: , Rfl:  .  fluorometholone (FML) 0.1 % ophthalmic suspension, fluorometholone 0.1 % eye drops,suspension, Disp: , Rfl:  .  formoterol (PERFOROMIST) 20 MCG/2ML nebulizer solution, Inhale into the lungs., Disp: , Rfl:  .  FREESTYLE LITE test strip, TEST AS DIRECTED TWICE A DAY, Disp: , Rfl: 99 .  furosemide (LASIX) 20 MG tablet, Take by mouth., Disp: , Rfl:  .  gabapentin (NEURONTIN) 400 MG capsule, Take by mouth., Disp: , Rfl:  .  guaiFENesin-codeine (ROBITUSSIN AC) 100-10 MG/5ML syrup, Virtussin AC 10 mg-100 mg/5 mL oral liquid, Disp: , Rfl:  .  hydrALAZINE (APRESOLINE) 50 MG tablet, Take by mouth., Disp: , Rfl:  .  Influenza vac split quadrivalent PF (FLUZONE HIGH-DOSE) 0.5 ML injection, Fluzone High-Dose 2018-2019 (PF) 180 mcg/0.5 mL intramuscular syringe  inject 0.5 milliliter intramuscularly, Disp: , Rfl:  .  insulin aspart (NOVOLOG) 100 UNIT/ML SOCT cartridge, Inject 30 Units into the skin once., Disp: , Rfl:  .  isosorbide mononitrate (IMDUR) 30 MG 24 hr tablet, Take by mouth., Disp: , Rfl:  .  ketoconazole (NIZORAL) 2 % cream, ketoconazole 2 % topical cream, Disp: , Rfl:  .  Lancets (FREESTYLE) lancets, TEST AS DIRECTED TWICE A DAY, Disp: , Rfl: 99 .  LANTUS SOLOSTAR 100 UNIT/ML Solostar Pen, , Disp: , Rfl:  .  levothyroxine (SYNTHROID, LEVOTHROID) 50 MCG tablet, Take by mouth., Disp: , Rfl:  .  lisinopril (PRINIVIL,ZESTRIL) 40 MG tablet, Take by mouth., Disp: , Rfl:  .  loperamide (IMODIUM) 2 MG capsule, Take by mouth., Disp: , Rfl:  .  metolazone (ZAROXOLYN) 2.5 MG tablet, , Disp: , Rfl:  .  Multiple Vitamin (MULTI-VITAMINS) TABS, Take by  mouth., Disp: , Rfl:  .  nitroGLYCERIN (NITROSTAT) 0.4 MG SL tablet, Place under the tongue., Disp: , Rfl:  .  NON FORMULARY, Preformist 7ml/vial once Q12 hours, Disp: , Rfl:  .  omeprazole (PRILOSEC OTC) 20 MG tablet, Take by mouth., Disp: , Rfl:  .  Omeprazole 20 MG TBEC, Take by mouth., Disp: , Rfl:  .  ondansetron (ZOFRAN) 8 MG tablet, Take 8 mg by mouth every 8 (eight) hours as needed., Disp: , Rfl:  .  pneumococcal 13-valent conjugate vaccine (PREVNAR 13) SUSP injection, Prevnar 13 (PF) 0.5 mL intramuscular syringe  inject 0.5 milliliter intramuscularly, Disp: , Rfl:  .  potassium chloride SA (K-DUR,KLOR-CON) 20 MEQ tablet, potassium chloride ER 20 mEq tablet,extended release(part/cryst), Disp: , Rfl:  .  ranolazine (RANEXA) 1000 MG SR tablet, Take by mouth., Disp: , Rfl:  .  simvastatin (ZOCOR) 80 MG tablet, Take by mouth., Disp: , Rfl:  .  tiotropium (SPIRIVA HANDIHALER) 18 MCG inhalation capsule, Place into inhaler and inhale., Disp: , Rfl:  .  traMADol (ULTRAM) 50 MG tablet, tramadol 50 mg tablet, Disp: , Rfl:  .  umeclidinium-vilanterol (ANORO ELLIPTA) 62.5-25 MCG/INH AEPB, Inhale into the lungs., Disp: , Rfl:  .  Vitamin D, Ergocalciferol, (DRISDOL) 50000 UNITS CAPS capsule, Take by mouth., Disp: , Rfl:   Social History   Tobacco Use  Smoking Status Never Smoker  Smokeless Tobacco Never Used    Allergies  Allergen Reactions  . Penicillins Swelling   Objective:  There were no vitals filed for this visit. There is no height or weight on file to calculate BMI. Constitutional Well developed. Well nourished.  Vascular Dorsalis pedis pulses palpable bilaterally. Posterior tibial pulses palpable bilaterally. Capillary refill normal to all digits.  No cyanosis or clubbing noted. Pedal hair growth normal.  Neurologic Normal speech. Oriented to person, place, and time. Epicritic sensation to light touch grossly present bilaterally.  Dermatologic Nails well groomed and normal  in appearance. No open wounds. No skin lesions.  Orthopedic:  Pain on palpation right medial gutter of the ankle joint.  Pain with range of motion.  Intra-articular pain noted.  No crepitus palpated.  No pain at the Achilles tendon, peroneal tendon,  ATFL ligament.  No pain on palpation right posterior tibial tendon insertion.  No pain along the course of the tendon.. No pain with resisted inversion and plantarflexion of the foot.   Radiographs: None Assessment:   1. Diabetic polyneuropathy associated with type 2 diabetes mellitus (HCC)   2. Capsulitis of ankle, right    Plan:  Patient was evaluated and treated and all questions answered.  Right ankle capsulitis secondary to gout flare -I explained patient the etiology of capsulitis and various treatment options were extensively discussed.  Given that patient has a  history of gout this is likely due to the gout flare in the ankle joint.  I have asked the patient to continue monitoring the gout.  I believe benefit from a second steroid injection.  It appears that patient injections are not lasting as long..  Patient agrees with the plan like to proceed with a steroid injection.  Also discussed with the patient and educated him on gout management and medication.  I discussed with him the importance of diet control without red meat or excessive amount of fish.  Patient states understanding and will try to do better with the diet control. -A third steroid injection was performed at right ankle using 1% plain Lidocaine and 10 mg of Kenalog. This was well tolerated. -Continue colchicine -Patient may benefit from increased dose of allopurinol as he still continues to get flareup secondary to gout.  Right posterior tibial tendinitis -Clinically resolved    No follow-ups on file.

## 2020-04-13 ENCOUNTER — Encounter: Payer: Self-pay | Admitting: Podiatry

## 2020-04-13 ENCOUNTER — Other Ambulatory Visit: Payer: Self-pay

## 2020-04-13 ENCOUNTER — Ambulatory Visit (INDEPENDENT_AMBULATORY_CARE_PROVIDER_SITE_OTHER): Payer: Medicare Other | Admitting: Podiatry

## 2020-04-13 DIAGNOSIS — E1142 Type 2 diabetes mellitus with diabetic polyneuropathy: Secondary | ICD-10-CM | POA: Diagnosis not present

## 2020-04-13 DIAGNOSIS — B351 Tinea unguium: Secondary | ICD-10-CM | POA: Diagnosis not present

## 2020-04-13 DIAGNOSIS — M79674 Pain in right toe(s): Secondary | ICD-10-CM

## 2020-04-13 DIAGNOSIS — M79675 Pain in left toe(s): Secondary | ICD-10-CM | POA: Diagnosis not present

## 2020-04-13 NOTE — Progress Notes (Signed)
This patient presents the office for an at risk foot care.  Patient requires his today by a professional for this patient will be at risk.  Patient is at risk due to coagulation disorder ESRD and DM.  Patient is presently taking Plavix.    He says he is unable to trim his nails since he cannot reach his nails.  He presents the office today for at risk  foot care.  Patient is taking plavix.   General Appearance  Alert, conversant and in no acute stress.  Vascular  Dorsalis pedis and posterior tibial  pulses are palpable  bilaterally.  Capillary return is within normal limits  bilaterally. Temperature is within normal limits  bilaterally.  Neurologic  Senn-Weinstein monofilament wire test within normal limits right foot.  Absent  LOPS left foot. Muscle power within normal limits bilaterally.  Nails Thick disfigured discolored nails with subungual debris  from hallux to fifth toes bilaterally. No evidence of bacterial infection or drainage bilaterally.  Orthopedic  No limitations of motion  feet .  No crepitus or effusions noted.  No bony pathology or digital deformities noted.  Skin  normotropic skin with no porokeratosis noted bilaterally.  No signs of infections or ulcers noted.    Onychomycosis  B/L.  Pain in toes  B/L.     Debridement and grinding of long thick painful nails.  Told this patient the importance of periodic foot evaluation this will help reduce the potential complications of his feet.  RTC 3 months.   Gardiner Barefoot DPM

## 2020-04-17 ENCOUNTER — Other Ambulatory Visit: Payer: Self-pay

## 2020-04-17 ENCOUNTER — Ambulatory Visit (INDEPENDENT_AMBULATORY_CARE_PROVIDER_SITE_OTHER): Payer: Medicare Other | Admitting: Podiatry

## 2020-04-17 DIAGNOSIS — M7751 Other enthesopathy of right foot: Secondary | ICD-10-CM | POA: Diagnosis not present

## 2020-04-17 DIAGNOSIS — E1142 Type 2 diabetes mellitus with diabetic polyneuropathy: Secondary | ICD-10-CM

## 2020-04-18 ENCOUNTER — Encounter: Payer: Self-pay | Admitting: Podiatry

## 2020-04-18 NOTE — Progress Notes (Signed)
Subjective:  Patient ID: Jacob Silva, male    DOB: 01/29/1932,  MRN: [id number]  No chief complaint on file.   86 y.o. male presents with the above complaint.  Patient presents with a follow-up of right ankle capsulitis secondary to a possible gout flare.  Patient states he is doing well.  He has been taking his colchicine medication.  He denies any other acute complaints  Review of Systems: Negative except as noted in the HPI. Denies N/V/F/Ch.  Past Medical History:  Diagnosis Date  . Diabetes ([place])   . HBP (high blood pressure)     Current Outpatient Medications:  .  acetaminophen (TYLENOL) 325 MG tablet, Take by mouth., Disp: , Rfl:  .  albuterol (PROAIR HFA) 108 (90 BASE) MCG/ACT inhaler, , Disp: , Rfl:  .  Alcohol Swabs (ALCOHOL WIPES) 70 % PADS, IV Prep Wipes medicated, Disp: , Rfl:  .  allopurinol (ZYLOPRIM) 100 MG tablet, Take by mouth., Disp: , Rfl:  .  amLODipine-benazepril (LOTREL) 5-20 MG capsule, TK 1 C PO QD, Disp: , Rfl:  .  aspirin 325 MG tablet, Take 325 mg by mouth daily., Disp: , Rfl:  .  atorvastatin (LIPITOR) 80 MG tablet, Take by mouth., Disp: , Rfl:  .  ATROVENT HFA 17 MCG/ACT inhaler, USE 2 PUFFS BID, Disp: , Rfl: 0 .  BD PEN NEEDLE MICRO U/F 32G X 6 MM MISC, SMARTSIG:1 Pre-Filled Pen Syringe SUB-Q Every Night, Disp: , Rfl:  .  benazepril (LOTENSIN) 10 MG tablet, Take by mouth., Disp: , Rfl:  .  benazepril (LOTENSIN) 5 MG tablet, Take 5 mg by mouth daily., Disp: , Rfl:  .  benzonatate (TESSALON) 100 MG capsule, benzonatate 100 mg capsule, Disp: , Rfl:  .  Blood Glucose Monitoring Suppl (FREESTYLE LITE) DEVI, 2 (two) times daily. for testing, Disp: , Rfl: 0 .  butalbital-acetaminophen-caffeine (FIORICET, ESGIC) 50-325-40 MG per tablet, Take by mouth., Disp: , Rfl:  .  carvedilol (COREG) 25 MG tablet, Take by mouth., Disp: , Rfl:  .  clopidogrel (PLAVIX) 75 MG tablet, Take by mouth., Disp: , Rfl:  .  diphenoxylate-atropine (LOMOTIL) 2.5-0.025 MG tablet,  diphenoxylate-atropine 2.5 mg-0.025 mg tablet, Disp: , Rfl:  .  fluconazole (DIFLUCAN) 200 MG tablet, fluconazole 200 mg tablet, Disp: , Rfl:  .  fluorometholone (FML) 0.1 % ophthalmic suspension, fluorometholone 0.1 % eye drops,suspension, Disp: , Rfl:  .  formoterol (PERFOROMIST) 20 MCG/2ML nebulizer solution, Inhale into the lungs., Disp: , Rfl:  .  FREESTYLE LITE test strip, TEST AS DIRECTED TWICE A DAY, Disp: , Rfl: 99 .  furosemide (LASIX) 20 MG tablet, Take by mouth., Disp: , Rfl:  .  gabapentin (NEURONTIN) 400 MG capsule, Take by mouth., Disp: , Rfl:  .  guaiFENesin-codeine (ROBITUSSIN AC) 100-10 MG/5ML syrup, Virtussin AC 10 mg-100 mg/5 mL oral liquid, Disp: , Rfl:  .  hydrALAZINE (APRESOLINE) 50 MG tablet, Take by mouth., Disp: , Rfl:  .  Influenza vac split quadrivalent PF (FLUZONE HIGH-DOSE) 0.5 ML injection, Fluzone High-Dose 2018-2019 (PF) 180 mcg/0.5 mL intramuscular syringe  inject 0.5 milliliter intramuscularly, Disp: , Rfl:  .  insulin aspart (NOVOLOG) 100 UNIT/ML SOCT cartridge, Inject 30 Units into the skin once., Disp: , Rfl:  .  isosorbide mononitrate (IMDUR) 30 MG 24 hr tablet, Take by mouth., Disp: , Rfl:  .  ketoconazole (NIZORAL) 2 % cream, ketoconazole 2 % topical cream, Disp: , Rfl:  .  Lancets (FREESTYLE) lancets, TEST AS DIRECTED TWICE A DAY, Disp: ,  Rfl: 99 .  LANTUS SOLOSTAR 100 UNIT/ML Solostar Pen, , Disp: , Rfl:  .  levothyroxine (SYNTHROID, LEVOTHROID) 50 MCG tablet, Take by mouth., Disp: , Rfl:  .  lisinopril (PRINIVIL,ZESTRIL) 40 MG tablet, Take by mouth., Disp: , Rfl:  .  loperamide (IMODIUM) 2 MG capsule, Take by mouth., Disp: , Rfl:  .  metolazone (ZAROXOLYN) 2.5 MG tablet, , Disp: , Rfl:  .  Multiple Vitamin (MULTI-VITAMINS) TABS, Take by mouth., Disp: , Rfl:  .  nitroGLYCERIN (NITROSTAT) 0.4 MG SL tablet, Place under the tongue., Disp: , Rfl:  .  NON FORMULARY, Preformist 76ml/vial once Q12 hours, Disp: , Rfl:  .  omeprazole (PRILOSEC OTC) 20 MG  tablet, Take by mouth., Disp: , Rfl:  .  Omeprazole 20 MG TBEC, Take by mouth., Disp: , Rfl:  .  ondansetron (ZOFRAN) 8 MG tablet, Take 8 mg by mouth every 8 (eight) hours as needed., Disp: , Rfl:  .  pneumococcal 13-valent conjugate vaccine (PREVNAR 13) SUSP injection, Prevnar 13 (PF) 0.5 mL intramuscular syringe  inject 0.5 milliliter intramuscularly, Disp: , Rfl:  .  potassium chloride SA (K-DUR,KLOR-CON) 20 MEQ tablet, potassium chloride ER 20 mEq tablet,extended release(part/cryst), Disp: , Rfl:  .  ranolazine (RANEXA) 1000 MG SR tablet, Take by mouth., Disp: , Rfl:  .  simvastatin (ZOCOR) 80 MG tablet, Take by mouth., Disp: , Rfl:  .  tiotropium (SPIRIVA HANDIHALER) 18 MCG inhalation capsule, Place into inhaler and inhale., Disp: , Rfl:  .  traMADol (ULTRAM) 50 MG tablet, tramadol 50 mg tablet, Disp: , Rfl:  .  umeclidinium-vilanterol (ANORO ELLIPTA) 62.5-25 MCG/INH AEPB, Inhale into the lungs., Disp: , Rfl:  .  Vitamin D, Ergocalciferol, (DRISDOL) 50000 UNITS CAPS capsule, Take by mouth., Disp: , Rfl:   Social History   Tobacco Use  Smoking Status Never Smoker  Smokeless Tobacco Never Used    Allergies  Allergen Reactions  . Penicillins Swelling   Objective:  There were no vitals filed for this visit. There is no height or weight on file to calculate BMI. Constitutional Well developed. Well nourished.  Vascular Dorsalis pedis pulses palpable bilaterally. Posterior tibial pulses palpable bilaterally. Capillary refill normal to all digits.  No cyanosis or clubbing noted. Pedal hair growth normal.  Neurologic Normal speech. Oriented to person, place, and time. Epicritic sensation to light touch grossly present bilaterally.  Dermatologic Nails well groomed and normal in appearance. No open wounds. No skin lesions.  Orthopedic:  Now pain on palpation right medial gutter of the ankle joint.  No pain with range of motion.  No intra-articular pain noted.  No crepitus palpated.   No pain at the Achilles tendon, peroneal tendon,  ATFL ligament.  No pain on palpation right posterior tibial tendon insertion.  No pain along the course of the tendon.. No pain with resisted inversion and plantarflexion of the foot.   Radiographs: None Assessment:   1. Diabetic polyneuropathy associated with type 2 diabetes mellitus (HCC)   2. Capsulitis of ankle, right    Plan:  Patient was evaluated and treated and all questions answered.  Right ankle capsulitis secondary to gout flare -Clinically healed.  The steroid injection gave complete relief.  I discussed with the patient of options of getting steroid injections in them each future.  As long as his pain is staying away for a long period of time I will continue just treating him conservatively with injections or immobilization.  Patient states understanding.  Right posterior tibial tendinitis -Clinically  resolved    No follow-ups on file.

## 2020-07-13 ENCOUNTER — Encounter: Payer: Self-pay | Admitting: Podiatry

## 2020-07-13 ENCOUNTER — Ambulatory Visit (INDEPENDENT_AMBULATORY_CARE_PROVIDER_SITE_OTHER): Payer: Medicare Other | Admitting: Podiatry

## 2020-07-13 ENCOUNTER — Other Ambulatory Visit: Payer: Self-pay

## 2020-07-13 DIAGNOSIS — B351 Tinea unguium: Secondary | ICD-10-CM | POA: Diagnosis not present

## 2020-07-13 DIAGNOSIS — D689 Coagulation defect, unspecified: Secondary | ICD-10-CM | POA: Diagnosis not present

## 2020-07-13 DIAGNOSIS — M79675 Pain in left toe(s): Secondary | ICD-10-CM

## 2020-07-13 DIAGNOSIS — E1142 Type 2 diabetes mellitus with diabetic polyneuropathy: Secondary | ICD-10-CM

## 2020-07-13 DIAGNOSIS — M79674 Pain in right toe(s): Secondary | ICD-10-CM

## 2020-07-13 NOTE — Progress Notes (Signed)
This patient presents the office for an at risk foot care.  Patient requires his today by a professional for this patient will be at risk.  Patient is at risk due to coagulation disorder ESRD and DM.  Patient is presently taking Plavix.    He says he is unable to trim his nails since he cannot reach his nails.  He presents the office today for at risk  foot care.  Patient is taking plavix.   General Appearance  Alert, conversant and in no acute stress.  Vascular  Dorsalis pedis and posterior tibial  pulses are palpable  bilaterally.  Capillary return is within normal limits  bilaterally. Temperature is within normal limits  bilaterally.  Neurologic  Senn-Weinstein monofilament wire test within normal limits right foot.  Absent  LOPS left foot. Muscle power within normal limits bilaterally.  Nails Thick disfigured discolored nails with subungual debris  from hallux to fifth toes bilaterally. No evidence of bacterial infection or drainage bilaterally.  Orthopedic  No limitations of motion  feet .  No crepitus or effusions noted.  No bony pathology or digital deformities noted.  Skin  normotropic skin with no porokeratosis noted bilaterally.  No signs of infections or ulcers noted.    Onychomycosis  B/L.  Pain in toes  B/L.     Debridement and grinding of long thick painful nails.  Told this patient the importance of periodic foot evaluation this will help reduce the potential complications of his feet.  RTC 3 months.   Gardiner Barefoot DPM

## 2020-10-16 ENCOUNTER — Encounter: Payer: Self-pay | Admitting: Podiatry

## 2020-10-16 ENCOUNTER — Other Ambulatory Visit: Payer: Self-pay

## 2020-10-16 ENCOUNTER — Ambulatory Visit (INDEPENDENT_AMBULATORY_CARE_PROVIDER_SITE_OTHER): Payer: Medicare Other | Admitting: Podiatry

## 2020-10-16 DIAGNOSIS — B351 Tinea unguium: Secondary | ICD-10-CM

## 2020-10-16 DIAGNOSIS — D689 Coagulation defect, unspecified: Secondary | ICD-10-CM

## 2020-10-16 DIAGNOSIS — M79675 Pain in left toe(s): Secondary | ICD-10-CM | POA: Diagnosis not present

## 2020-10-16 DIAGNOSIS — M79674 Pain in right toe(s): Secondary | ICD-10-CM

## 2020-10-16 DIAGNOSIS — E1142 Type 2 diabetes mellitus with diabetic polyneuropathy: Secondary | ICD-10-CM | POA: Diagnosis not present

## 2020-10-16 NOTE — Progress Notes (Signed)
This patient presents the office for an at risk foot care.  Patient requires his today by a professional for this patient will be at risk.  Patient is at risk due to coagulation disorder ESRD and DM.  Patient is presently taking Plavix.    He says he is unable to trim his nails since he cannot reach his nails.  He presents the office today for at risk  foot care.  Patient is taking plavix.   General Appearance  Alert, conversant and in no acute stress.  Vascular  Dorsalis pedis and posterior tibial  pulses are palpable  bilaterally.  Capillary return is within normal limits  bilaterally. Temperature is within normal limits  bilaterally.  Neurologic  Senn-Weinstein monofilament wire test within normal limits right foot.  Absent  LOPS left foot. Muscle power within normal limits bilaterally.  Nails Thick disfigured discolored nails with subungual debris  from hallux to fifth toes bilaterally. No evidence of bacterial infection or drainage bilaterally.  Orthopedic  No limitations of motion  feet .  No crepitus or effusions noted.  No bony pathology or digital deformities noted.  Skin  normotropic skin with no porokeratosis noted bilaterally.  No signs of infections or ulcers noted.    Onychomycosis  B/L.  Pain in toes  B/L.     Debridement and grinding of long thick painful nails.  Told this patient the importance of periodic foot evaluation this will help reduce the potential complications of his feet.  RTC 3 months.   Gardiner Barefoot DPM

## 2020-12-12 ENCOUNTER — Encounter: Payer: Self-pay | Admitting: Podiatry

## 2020-12-12 ENCOUNTER — Other Ambulatory Visit: Payer: Self-pay

## 2020-12-12 ENCOUNTER — Ambulatory Visit (INDEPENDENT_AMBULATORY_CARE_PROVIDER_SITE_OTHER): Payer: Medicare Other | Admitting: Podiatry

## 2020-12-12 ENCOUNTER — Other Ambulatory Visit: Payer: Self-pay | Admitting: Podiatry

## 2020-12-12 DIAGNOSIS — M10072 Idiopathic gout, left ankle and foot: Secondary | ICD-10-CM | POA: Diagnosis not present

## 2020-12-12 DIAGNOSIS — M7752 Other enthesopathy of left foot: Secondary | ICD-10-CM | POA: Diagnosis not present

## 2020-12-12 MED ORDER — BETAMETHASONE SOD PHOS & ACET 6 (3-3) MG/ML IJ SUSP
3.0000 mg | Freq: Once | INTRAMUSCULAR | Status: AC
  Administered 2020-12-12: 3 mg via INTRA_ARTICULAR

## 2020-12-12 MED ORDER — COLCHICINE 0.6 MG PO TABS: 0.6000 mg | ORAL_TABLET | Freq: Every day | ORAL | 0 refills | Status: DC

## 2020-12-12 NOTE — Progress Notes (Signed)
   Subjective:  87 y.o. male presenting today for new complaint regarding left ankle pain.  Patient states that he believes he has gout.  He does have a history of gout and he takes allopurinol.  Patient states that the pain began in his left ankle on Sunday and has progressed over the last day or 2.  It is very painful to walk and it is red and swollen.  He has not done anything for treatment other than allopurinol daily   Past Medical History:  Diagnosis Date  . Diabetes ([place])   . HBP (high blood pressure)      Objective / Physical Exam:  General:  The patient is alert and oriented x3 in no acute distress. Dermatology:  Skin is warm, dry and supple bilateral lower extremities. Negative for open lesions or macerations. Vascular:  Palpable pedal pulses bilaterally. No edema or erythema noted. Capillary refill within normal limits. Neurological:  Epicritic and protective threshold grossly intact bilaterally.  Musculoskeletal Exam:  Erythema with edema noted to the medial aspect of the patient's left ankle joint.  It is very sensitive and painful to palpation range of motion within normal limits to all pedal and ankle joints bilateral. Muscle strength 5/5 in all groups bilateral.   Radiographic Exam:  Normal osseous mineralization. Joint spaces preserved. No fracture/dislocation/boney destruction.    Assessment: 1.  Acute gout/capsulitis left ankle  Plan of Care:  1. Patient was evaluated. X-Rays reviewed.  2. Injection of 0.5 mL Celestone Soluspan injected in the patient's left ankle. 3.  Prescription for colchicine 0.6 mg daily 4.  Continue allopurinol daily 5.  Return to clinic neck scheduled appointment   Edrick Kins, DPM [facility]  Dr. Edrick Kins, DPM    [address], [place] [address]                Office [phone]  Fax [phone]

## 2020-12-15 ENCOUNTER — Ambulatory Visit (INDEPENDENT_AMBULATORY_CARE_PROVIDER_SITE_OTHER): Payer: Medicare Other | Admitting: Podiatry

## 2020-12-15 ENCOUNTER — Other Ambulatory Visit: Payer: Self-pay | Admitting: Podiatry

## 2020-12-15 ENCOUNTER — Other Ambulatory Visit: Payer: Self-pay

## 2020-12-15 DIAGNOSIS — M7752 Other enthesopathy of left foot: Secondary | ICD-10-CM | POA: Diagnosis not present

## 2020-12-15 MED ORDER — METHYLPREDNISOLONE 4 MG PO TBPK: ORAL_TABLET | ORAL | 0 refills | Status: DC

## 2020-12-15 NOTE — Telephone Encounter (Signed)
Please advise 

## 2020-12-31 DIAGNOSIS — M7752 Other enthesopathy of left foot: Secondary | ICD-10-CM | POA: Diagnosis not present

## 2020-12-31 MED ORDER — BETAMETHASONE SOD PHOS & ACET 6 (3-3) MG/ML IJ SUSP
3.0000 mg | Freq: Once | INTRAMUSCULAR | Status: AC
  Administered 2020-12-31: 3 mg via INTRA_ARTICULAR

## 2020-12-31 NOTE — Progress Notes (Signed)
   Subjective:  87 y.o. male presenting today for follow-up evaluation of an acute gout flareup to the left ankle.  Patient continues to have some pain and tenderness to the left ankle.  He did not take the colchicine due to concern of CKD.  Last seen in the office on 12/12/2020.  No new complaints at this time.   Past Medical History:  Diagnosis Date  . Diabetes ([place])   . HBP (high blood pressure)      Objective / Physical Exam:  General:  The patient is alert and oriented x3 in no acute distress. Dermatology:  Skin is warm, dry and supple bilateral lower extremities. Negative for open lesions or macerations. Vascular:  Palpable pedal pulses bilaterally. No edema or erythema noted. Capillary refill within normal limits. Neurological:  Epicritic and protective threshold grossly intact bilaterally.  Musculoskeletal Exam:  Erythema with edema noted to the medial aspect of the patient's left ankle joint.  It is very sensitive and painful to palpation range of motion within normal limits to all pedal and ankle joints bilateral. Muscle strength 5/5 in all groups bilateral.   Assessment: 1.  Acute gout/capsulitis left ankle  Plan of Care:  1. Patient was evaluated.   2. Injection of 0.5 mL Celestone Soluspan injected in the patient's left ankle. 3.  Patient may discontinue colchicine 0.6 mg due to concern of CKD. 4.  Prescription for a Medrol Dosepak  5.  Continue allopurinol daily as per prescribing physician 6.  Return to clinic in 3 weeks   Edrick Kins, DPM [facility]  Dr. Edrick Kins, DPM    [address], [place] [address]                Office [phone]  Fax [phone]

## 2021-01-18 ENCOUNTER — Ambulatory Visit (INDEPENDENT_AMBULATORY_CARE_PROVIDER_SITE_OTHER): Payer: Medicare Other | Admitting: Podiatry

## 2021-01-18 ENCOUNTER — Other Ambulatory Visit: Payer: Self-pay

## 2021-01-18 ENCOUNTER — Encounter: Payer: Self-pay | Admitting: Podiatry

## 2021-01-18 DIAGNOSIS — M79675 Pain in left toe(s): Secondary | ICD-10-CM | POA: Diagnosis not present

## 2021-01-18 DIAGNOSIS — B351 Tinea unguium: Secondary | ICD-10-CM

## 2021-01-18 DIAGNOSIS — D689 Coagulation defect, unspecified: Secondary | ICD-10-CM | POA: Diagnosis not present

## 2021-01-18 DIAGNOSIS — M79674 Pain in right toe(s): Secondary | ICD-10-CM

## 2021-01-18 DIAGNOSIS — E1142 Type 2 diabetes mellitus with diabetic polyneuropathy: Secondary | ICD-10-CM | POA: Diagnosis not present

## 2021-01-18 NOTE — Progress Notes (Signed)
This patient presents the office for an at risk foot care.  Patient requires his today by a professional for this patient will be at risk.  Patient is at risk due to coagulation disorder ESRD and DM.  Patient is presently taking Plavix.    He says he is unable to trim his nails since he cannot reach his nails.  He presents the office today for at risk  foot care.  Patient is taking plavix.   General Appearance  Alert, conversant and in no acute stress.  Vascular  Dorsalis pedis and posterior tibial  pulses are palpable  bilaterally.  Capillary return is within normal limits  bilaterally. Temperature is within normal limits  bilaterally.  Neurologic  Senn-Weinstein monofilament wire test within normal limits right foot.  Absent  LOPS left foot. Muscle power within normal limits bilaterally.  Nails Thick disfigured discolored nails with subungual debris  from second to fifth toes bilaterally. No evidence of bacterial infection or drainage bilaterally.  Orthopedic  No limitations of motion  feet .  No crepitus or effusions noted.  No bony pathology or digital deformities noted.  Skin  normotropic skin with no porokeratosis noted bilaterally.  No signs of infections or ulcers noted.    Onychomycosis  B/L.  Pain in toes  B/L.     Debridement and grinding of long thick painful nails.   Debride nails with nail nipper followed by dremel tool.Told this patient the importance of periodic foot evaluation this will help reduce the potential complications of his feet.  RTC 3 months.   Gardiner Barefoot DPM

## 2021-04-26 ENCOUNTER — Encounter: Payer: Self-pay | Admitting: Podiatry

## 2021-04-26 ENCOUNTER — Other Ambulatory Visit: Payer: Self-pay

## 2021-04-26 ENCOUNTER — Ambulatory Visit (INDEPENDENT_AMBULATORY_CARE_PROVIDER_SITE_OTHER): Payer: Medicare Other | Admitting: Podiatry

## 2021-04-26 DIAGNOSIS — M79675 Pain in left toe(s): Secondary | ICD-10-CM

## 2021-04-26 DIAGNOSIS — B351 Tinea unguium: Secondary | ICD-10-CM

## 2021-04-26 DIAGNOSIS — E1142 Type 2 diabetes mellitus with diabetic polyneuropathy: Secondary | ICD-10-CM | POA: Diagnosis not present

## 2021-04-26 DIAGNOSIS — D689 Coagulation defect, unspecified: Secondary | ICD-10-CM | POA: Diagnosis not present

## 2021-04-26 DIAGNOSIS — M79674 Pain in right toe(s): Secondary | ICD-10-CM

## 2021-04-26 NOTE — Progress Notes (Signed)
This patient presents the office for an at risk foot care.  Patient requires his today by a professional for this patient will be at risk.  Patient is at risk due to coagulation disorder ESRD and DM.  Patient is presently taking Plavix.    He says he is unable to trim his nails since he cannot reach his nails.  He presents the office today for at risk  foot care.  Patient is taking plavix.   General Appearance  Alert, conversant and in no acute stress.  Vascular  Dorsalis pedis and posterior tibial  pulses are palpable  bilaterally.  Capillary return is within normal limits  bilaterally. Temperature is within normal limits  bilaterally.  Neurologic  Senn-Weinstein monofilament wire test within normal limits right foot.  Absent  LOPS left foot. Muscle power within normal limits bilaterally.  Nails Thick disfigured discolored nails with subungual debris  from second to fifth toes bilaterally. No evidence of bacterial infection or drainage bilaterally.  Orthopedic  No limitations of motion  feet .  No crepitus or effusions noted.  No bony pathology or digital deformities noted.  Skin  normotropic skin with no porokeratosis noted bilaterally.  No signs of infections or ulcers noted.    Onychomycosis  B/L.  Pain in toes  B/L.     Debridement and grinding of long thick painful nails.   Debride nails with nail nipper followed by dremel tool.Told this patient the importance of periodic foot evaluation this will help reduce the potential complications of his feet.  RTC 3 months.   Gardiner Barefoot DPM

## 2021-07-26 ENCOUNTER — Other Ambulatory Visit: Payer: Self-pay

## 2021-07-26 ENCOUNTER — Encounter: Payer: Self-pay | Admitting: Podiatry

## 2021-07-26 ENCOUNTER — Ambulatory Visit (INDEPENDENT_AMBULATORY_CARE_PROVIDER_SITE_OTHER): Payer: No Typology Code available for payment source | Admitting: Podiatry

## 2021-07-26 DIAGNOSIS — M79675 Pain in left toe(s): Secondary | ICD-10-CM

## 2021-07-26 DIAGNOSIS — B351 Tinea unguium: Secondary | ICD-10-CM | POA: Diagnosis not present

## 2021-07-26 DIAGNOSIS — E1142 Type 2 diabetes mellitus with diabetic polyneuropathy: Secondary | ICD-10-CM

## 2021-07-26 DIAGNOSIS — M79674 Pain in right toe(s): Secondary | ICD-10-CM

## 2021-07-26 DIAGNOSIS — D689 Coagulation defect, unspecified: Secondary | ICD-10-CM

## 2021-07-26 NOTE — Progress Notes (Signed)
This patient presents the office for an at risk foot care.  Patient requires his today by a professional for this patient will be at risk.  Patient is at risk due to coagulation disorder ESRD and DM.  Patient is presently taking Plavix.    He says he is unable to trim his nails since he cannot reach his nails.  He presents the office today for at risk  foot care.  Patient is taking plavix.   General Appearance  Alert, conversant and in no acute stress.  Vascular  Dorsalis pedis and posterior tibial  pulses are palpable  bilaterally.  Capillary return is within normal limits  bilaterally. Temperature is within normal limits  bilaterally.  Neurologic  Senn-Weinstein monofilament wire test within normal limits right foot.  Absent  LOPS left foot. Muscle power within normal limits bilaterally.  Nails Thick disfigured discolored nails with subungual debris  from second to fifth toes bilaterally. No evidence of bacterial infection or drainage bilaterally.  Orthopedic  No limitations of motion  feet .  No crepitus or effusions noted.  No bony pathology or digital deformities noted.  Skin  normotropic skin with no porokeratosis noted bilaterally.  No signs of infections or ulcers noted.    Onychomycosis  B/L.  Pain in toes  B/L.     Debridement and grinding of long thick painful nails.   Debride nails with nail nipper followed by dremel tool.Told this patient the importance of periodic foot evaluation this will help reduce the potential complications of his feet.  RTC 3 months.   Gardiner Barefoot DPM

## 2021-08-07 ENCOUNTER — Ambulatory Visit (INDEPENDENT_AMBULATORY_CARE_PROVIDER_SITE_OTHER): Payer: No Typology Code available for payment source | Admitting: Podiatry

## 2021-08-07 ENCOUNTER — Encounter: Payer: Self-pay | Admitting: Podiatry

## 2021-08-07 ENCOUNTER — Other Ambulatory Visit: Payer: Self-pay

## 2021-08-07 ENCOUNTER — Ambulatory Visit (INDEPENDENT_AMBULATORY_CARE_PROVIDER_SITE_OTHER): Payer: Medicare Other

## 2021-08-07 DIAGNOSIS — M7752 Other enthesopathy of left foot: Secondary | ICD-10-CM

## 2021-08-07 DIAGNOSIS — M10072 Idiopathic gout, left ankle and foot: Secondary | ICD-10-CM

## 2021-08-07 MED ORDER — METHYLPREDNISOLONE 4 MG PO TBPK: ORAL_TABLET | ORAL | 0 refills | Status: DC

## 2021-08-19 MED ORDER — BETAMETHASONE SOD PHOS & ACET 6 (3-3) MG/ML IJ SUSP
3.0000 mg | Freq: Once | INTRAMUSCULAR | Status: AC
  Administered 2021-08-19: 3 mg via INTRA_ARTICULAR

## 2021-08-19 NOTE — Progress Notes (Signed)
° °  HPI: 88 y.o. male presenting today for evaluation of acute gout flareup to the left ankle.  Patient states this started about 3 days ago.  Idiopathic onset.  The ankle is now swollen and red and painful to touch.  It hurts to walk.  He is taking allopurinol and colchicine which has helped some.  Past Medical History:  Diagnosis Date   Diabetes ([place])    HBP (high blood pressure)     Past Surgical History:  Procedure Laterality Date   BACK SURGERY     HERNIA REPAIR     KNEE SURGERY Right    TRIPLE BYPASS      Allergies  Allergen Reactions   Benzonatate Other (See Comments)   Penicillin G Other (See Comments)   Penicillins Swelling     Physical Exam: General: The patient is alert and oriented x3 in no acute distress.  Dermatology: Skin is warm, dry and supple bilateral lower extremities. Negative for open lesions or macerations.  Vascular: Palpable pedal pulses bilaterally. Capillary refill within normal limits.  There is some slight warmth and edema noted to the left ankle  Neurological: Light touch and protective threshold grossly intact  Musculoskeletal Exam: Exquisite tenderness to even light touch or palpation to the ankle joint.  Radiographic Exam:  Diffuse degenerative changes noted throughout the pedal joints of the foot consistent with patient's age would be considered within normal limits.  No fractures identified.  There does appear to be an old fracture that is healed to the fibula left ankle as well as the fifth metatarsal with some osseous restructuring as well  Assessment: 1.  Acute inflammatory gout left ankle 2.  Capsulitis left ankle   Plan of Care:  1. Patient evaluated. X-Rays reviewed.  2.  Injection of 0.5 cc Celestone Soluspan injection left ankle joint 3.  Continue allopurinol as per PCP 4.  Prescription for Medrol Dosepak 5.  Patient is on anticoagulant Plavix.  No NSAIDs were prescribed today 6.  Return to clinic 4 weeks      Edrick Kins, DPM [facility]  Dr. Edrick Kins, DPM    [address], [place] [address]                Office [phone]  Fax [phone]

## 2021-10-24 ENCOUNTER — Encounter: Payer: Self-pay | Admitting: Podiatry

## 2021-10-24 ENCOUNTER — Ambulatory Visit (INDEPENDENT_AMBULATORY_CARE_PROVIDER_SITE_OTHER): Payer: No Typology Code available for payment source | Admitting: Podiatry

## 2021-10-24 DIAGNOSIS — B351 Tinea unguium: Secondary | ICD-10-CM | POA: Diagnosis not present

## 2021-10-24 DIAGNOSIS — M79675 Pain in left toe(s): Secondary | ICD-10-CM | POA: Diagnosis not present

## 2021-10-24 DIAGNOSIS — M79674 Pain in right toe(s): Secondary | ICD-10-CM | POA: Diagnosis not present

## 2021-10-24 DIAGNOSIS — B353 Tinea pedis: Secondary | ICD-10-CM

## 2021-10-24 MED ORDER — KETOCONAZOLE 2 % EX CREA: 1.0000 "application " | TOPICAL_CREAM | Freq: Every day | CUTANEOUS | 2 refills | Status: DC

## 2021-10-25 ENCOUNTER — Ambulatory Visit: Payer: No Typology Code available for payment source | Admitting: Podiatry

## 2021-10-26 ENCOUNTER — Encounter: Payer: Self-pay | Admitting: Podiatry

## 2021-10-26 NOTE — Progress Notes (Signed)
?  Subjective:  ?Patient ID: Jacob Silva, male    DOB: 05/30/1932,  MRN: [id number] ? ?Chief Complaint  ?Patient presents with  ? Nail Problem  ? Diabetes  ?  Nail trim [facility]  ? ? ?88 y.o. male presents with the above complaint. History confirmed with patient.  Nails are thickened and elongated and causing discomfort and causing pain.  Also has very dry skin ? ?Objective:  ?Physical Exam: ?warm, good capillary refill, no trophic changes or ulcerative lesions, normal DP and PT pulses, normal sensory exam, and tinea pedis. ?Left Foot: dystrophic yellowed discolored nail plates with subungual debris ?Right Foot: dystrophic yellowed discolored nail plates with subungual debris ? ?Assessment:  ? ?1. Tinea pedis of both feet   ?2. Pain due to onychomycosis of toenails of both feet   ? ? ? ?Plan:  ?Patient was evaluated and treated and all questions answered. ? ?Discussed the etiology and treatment options for tinea pedis.  Discussed topical and oral treatment.  Recommended topical treatment with 2% ketoconazole cream.  This was sent to the patient's pharmacy.  Also discussed appropriate foot hygiene, use of antifungal spray such as Tinactin in shoes, as well as cleaning foot surfaces such as showers and bathroom floors with bleach. ? ?Discussed the etiology and treatment options for the condition in detail with the patient. Educated patient on the topical and oral treatment options for mycotic nails. Recommended debridement of the nails today. Sharp and mechanical debridement performed of all painful and mycotic nails today. Nails debrided in length and thickness using a nail nipper to level of comfort. Discussed treatment options including appropriate shoe gear. Follow up as needed for painful nails. ? ? ?Return in about 3 months (around 01/24/2022) for at risk diabetic foot care.  ? ?

## 2022-01-13 ENCOUNTER — Inpatient Hospital Stay (HOSPITAL_COMMUNITY)
Admission: EM | Admit: 2022-01-13 | Discharge: 2022-01-19 | DRG: 280 | Disposition: A | Discharge status: Home Health | Payer: No Typology Code available for payment source | Attending: Internal Medicine | Admitting: Internal Medicine

## 2022-01-13 ENCOUNTER — Other Ambulatory Visit: Payer: Self-pay

## 2022-01-13 ENCOUNTER — Encounter (HOSPITAL_COMMUNITY): Payer: Self-pay

## 2022-01-13 ENCOUNTER — Emergency Department (HOSPITAL_COMMUNITY): Payer: No Typology Code available for payment source

## 2022-01-13 DIAGNOSIS — Z79899 Other long term (current) drug therapy: Secondary | ICD-10-CM | POA: Diagnosis not present

## 2022-01-13 DIAGNOSIS — R0603 Acute respiratory distress: Secondary | ICD-10-CM

## 2022-01-13 DIAGNOSIS — E1149 Type 2 diabetes mellitus with other diabetic neurological complication: Secondary | ICD-10-CM | POA: Diagnosis not present

## 2022-01-13 DIAGNOSIS — E1122 Type 2 diabetes mellitus with diabetic chronic kidney disease: Secondary | ICD-10-CM | POA: Diagnosis present

## 2022-01-13 DIAGNOSIS — D631 Anemia in chronic kidney disease: Secondary | ICD-10-CM | POA: Diagnosis present

## 2022-01-13 DIAGNOSIS — G4733 Obstructive sleep apnea (adult) (pediatric): Secondary | ICD-10-CM | POA: Diagnosis present

## 2022-01-13 DIAGNOSIS — I2510 Atherosclerotic heart disease of native coronary artery without angina pectoris: Secondary | ICD-10-CM | POA: Diagnosis present

## 2022-01-13 DIAGNOSIS — Z951 Presence of aortocoronary bypass graft: Secondary | ICD-10-CM | POA: Diagnosis not present

## 2022-01-13 DIAGNOSIS — M109 Gout, unspecified: Secondary | ICD-10-CM | POA: Diagnosis present

## 2022-01-13 DIAGNOSIS — I5023 Acute on chronic systolic (congestive) heart failure: Principal | ICD-10-CM

## 2022-01-13 DIAGNOSIS — E039 Hypothyroidism, unspecified: Secondary | ICD-10-CM | POA: Diagnosis not present

## 2022-01-13 DIAGNOSIS — J449 Chronic obstructive pulmonary disease, unspecified: Secondary | ICD-10-CM | POA: Diagnosis not present

## 2022-01-13 DIAGNOSIS — Z79890 Hormone replacement therapy (postmenopausal): Secondary | ICD-10-CM | POA: Diagnosis not present

## 2022-01-13 DIAGNOSIS — Z7982 Long term (current) use of aspirin: Secondary | ICD-10-CM

## 2022-01-13 DIAGNOSIS — Z880 Allergy status to penicillin: Secondary | ICD-10-CM

## 2022-01-13 DIAGNOSIS — I5033 Acute on chronic diastolic (congestive) heart failure: Secondary | ICD-10-CM | POA: Diagnosis present

## 2022-01-13 DIAGNOSIS — I10 Essential (primary) hypertension: Secondary | ICD-10-CM | POA: Diagnosis not present

## 2022-01-13 DIAGNOSIS — N1832 Chronic kidney disease, stage 3b: Secondary | ICD-10-CM | POA: Diagnosis not present

## 2022-01-13 DIAGNOSIS — I214 Non-ST elevation (NSTEMI) myocardial infarction: Secondary | ICD-10-CM | POA: Diagnosis not present

## 2022-01-13 DIAGNOSIS — N179 Acute kidney failure, unspecified: Secondary | ICD-10-CM | POA: Diagnosis present

## 2022-01-13 DIAGNOSIS — D72823 Leukemoid reaction: Secondary | ICD-10-CM | POA: Diagnosis not present

## 2022-01-13 DIAGNOSIS — E7800 Pure hypercholesterolemia, unspecified: Secondary | ICD-10-CM | POA: Diagnosis present

## 2022-01-13 DIAGNOSIS — J8410 Pulmonary fibrosis, unspecified: Secondary | ICD-10-CM | POA: Diagnosis present

## 2022-01-13 DIAGNOSIS — I509 Heart failure, unspecified: Secondary | ICD-10-CM | POA: Diagnosis not present

## 2022-01-13 DIAGNOSIS — Z888 Allergy status to other drugs, medicaments and biological substances status: Secondary | ICD-10-CM

## 2022-01-13 DIAGNOSIS — Z7902 Long term (current) use of antithrombotics/antiplatelets: Secondary | ICD-10-CM

## 2022-01-13 DIAGNOSIS — Z794 Long term (current) use of insulin: Secondary | ICD-10-CM | POA: Diagnosis not present

## 2022-01-13 DIAGNOSIS — I959 Hypotension, unspecified: Secondary | ICD-10-CM | POA: Diagnosis not present

## 2022-01-13 DIAGNOSIS — Z66 Do not resuscitate: Secondary | ICD-10-CM | POA: Diagnosis present

## 2022-01-13 DIAGNOSIS — J9621 Acute and chronic respiratory failure with hypoxia: Secondary | ICD-10-CM | POA: Diagnosis not present

## 2022-01-13 DIAGNOSIS — Z20822 Contact with and (suspected) exposure to covid-19: Secondary | ICD-10-CM | POA: Diagnosis present

## 2022-01-13 DIAGNOSIS — E119 Type 2 diabetes mellitus without complications: Secondary | ICD-10-CM

## 2022-01-13 DIAGNOSIS — Z9981 Dependence on supplemental oxygen: Secondary | ICD-10-CM

## 2022-01-13 DIAGNOSIS — I130 Hypertensive heart and chronic kidney disease with heart failure and stage 1 through stage 4 chronic kidney disease, or unspecified chronic kidney disease: Secondary | ICD-10-CM | POA: Diagnosis present

## 2022-01-13 DIAGNOSIS — R0609 Other forms of dyspnea: Secondary | ICD-10-CM | POA: Diagnosis not present

## 2022-01-13 DIAGNOSIS — Z885 Allergy status to narcotic agent status: Secondary | ICD-10-CM

## 2022-01-13 HISTORY — DX: Heart failure, unspecified: I50.9

## 2022-01-13 HISTORY — DX: Non-ST elevation (NSTEMI) myocardial infarction: I21.4

## 2022-01-13 LAB — PROTIME-INR
INR: 1.3 — ABNORMAL HIGH (ref 0.8–1.2)
Prothrombin Time: 16 seconds — ABNORMAL HIGH (ref 11.4–15.2)

## 2022-01-13 LAB — COMPREHENSIVE METABOLIC PANEL
ALT: 38 U/L (ref 0–44)
AST: 51 U/L — ABNORMAL HIGH (ref 15–41)
Albumin: 3.1 g/dL — ABNORMAL LOW (ref 3.5–5.0)
Alkaline Phosphatase: 114 U/L (ref 38–126)
Anion gap: 14 (ref 5–15)
BUN: 36 mg/dL — ABNORMAL HIGH (ref 8–23)
CO2: 18 mmol/L — ABNORMAL LOW (ref 22–32)
Calcium: 8.4 mg/dL — ABNORMAL LOW (ref 8.9–10.3)
Chloride: 108 mmol/L (ref 98–111)
Creatinine, Ser: 2.9 mg/dL — ABNORMAL HIGH (ref 0.61–1.24)
GFR, Estimated: 20 mL/min — ABNORMAL LOW (ref >60)
Glucose, Bld: 139 mg/dL — ABNORMAL HIGH (ref 70–99)
Potassium: 4.7 mmol/L (ref 3.5–5.1)
Sodium: 140 mmol/L (ref 135–145)
Total Bilirubin: 0.7 mg/dL (ref 0.3–1.2)
Total Protein: 5.7 g/dL — ABNORMAL LOW (ref 6.5–8.1)

## 2022-01-13 LAB — I-STAT VENOUS BLOOD GAS, ED
Acid-base deficit: 3 mmol/L — ABNORMAL HIGH (ref 0.0–2.0)
Bicarbonate: 23.9 mmol/L (ref 20.0–28.0)
Calcium, Ion: 1.01 mmol/L — ABNORMAL LOW (ref 1.15–1.40)
HCT: 34 % — ABNORMAL LOW (ref 39.0–52.0)
Hemoglobin: 11.6 g/dL — ABNORMAL LOW (ref 13.0–17.0)
O2 Saturation: 55 %
Potassium: 3.9 mmol/L (ref 3.5–5.1)
Sodium: 145 mmol/L (ref 135–145)
TCO2: 25 mmol/L (ref 22–32)
pCO2, Ven: 49 mmHg (ref 44–60)
pH, Ven: 7.296 (ref 7.25–7.43)
pO2, Ven: 32 mmHg (ref 32–45)

## 2022-01-13 LAB — SARS CORONAVIRUS 2 BY RT PCR: SARS Coronavirus 2 by RT PCR: NEGATIVE

## 2022-01-13 LAB — TROPONIN I (HIGH SENSITIVITY)
Troponin I (High Sensitivity): 119 ng/L (ref <18)
Troponin I (High Sensitivity): 318 ng/L (ref <18)
Troponin I (High Sensitivity): 577 ng/L (ref <18)
Troponin I (High Sensitivity): 641 ng/L (ref <18)

## 2022-01-13 LAB — CBC
HCT: 38.5 % — ABNORMAL LOW (ref 39.0–52.0)
Hemoglobin: 12.5 g/dL — ABNORMAL LOW (ref 13.0–17.0)
MCH: 26.5 pg (ref 26.0–34.0)
MCHC: 32.5 g/dL (ref 30.0–36.0)
MCV: 81.6 fL (ref 80.0–100.0)
Platelets: 163 10*3/uL (ref 150–400)
RBC: 4.72 MIL/uL (ref 4.22–5.81)
RDW: 16.7 % — ABNORMAL HIGH (ref 11.5–15.5)
WBC: 11.2 10*3/uL — ABNORMAL HIGH (ref 4.0–10.5)
nRBC: 0 % (ref 0.0–0.2)

## 2022-01-13 LAB — GLUCOSE, CAPILLARY: Glucose-Capillary: 129 mg/dL — ABNORMAL HIGH (ref 70–99)

## 2022-01-13 LAB — BRAIN NATRIURETIC PEPTIDE: B Natriuretic Peptide: 529.5 pg/mL — ABNORMAL HIGH (ref 0.0–100.0)

## 2022-01-13 LAB — MAGNESIUM: Magnesium: 1.8 mg/dL (ref 1.7–2.4)

## 2022-01-13 MED ORDER — IPRATROPIUM-ALBUTEROL 0.5-2.5 (3) MG/3ML IN SOLN
3.0000 mL | Freq: Once | RESPIRATORY_TRACT | Status: AC
  Administered 2022-01-13: 3 mL via RESPIRATORY_TRACT
  Filled 2022-01-13: qty 3

## 2022-01-13 MED ORDER — GABAPENTIN 300 MG PO CAPS
400.0000 mg | ORAL_CAPSULE | Freq: Two times a day (BID) | ORAL | Status: DC
  Administered 2022-01-13 – 2022-01-19 (×12): 400 mg via ORAL
  Filled 2022-01-13 (×12): qty 1

## 2022-01-13 MED ORDER — METHYLPREDNISOLONE SODIUM SUCC 125 MG IJ SOLR
125.0000 mg | Freq: Once | INTRAMUSCULAR | Status: AC
  Administered 2022-01-13: 125 mg via INTRAVENOUS
  Filled 2022-01-13: qty 2

## 2022-01-13 MED ORDER — POLYETHYLENE GLYCOL 3350 17 G PO PACK
17.0000 g | PACK | Freq: Every day | ORAL | Status: DC | PRN
Start: 2022-01-13 — End: 2022-01-19

## 2022-01-13 MED ORDER — FUROSEMIDE 10 MG/ML IJ SOLN
40.0000 mg | Freq: Once | INTRAMUSCULAR | Status: AC
  Administered 2022-01-13: 40 mg via INTRAVENOUS
  Filled 2022-01-13: qty 4

## 2022-01-13 MED ORDER — HEPARIN (PORCINE) 25000 UT/250ML-% IV SOLN
1300.0000 [IU]/h | INTRAVENOUS | Status: AC
  Administered 2022-01-13 – 2022-01-15 (×3): 1300 [IU]/h via INTRAVENOUS
  Filled 2022-01-13 (×3): qty 250

## 2022-01-13 MED ORDER — HEPARIN BOLUS VIA INFUSION
4000.0000 [IU] | Freq: Once | INTRAVENOUS | Status: AC
  Administered 2022-01-13: 4000 [IU] via INTRAVENOUS
  Filled 2022-01-13: qty 4000

## 2022-01-13 MED ORDER — PANTOPRAZOLE SODIUM 40 MG PO TBEC
40.0000 mg | DELAYED_RELEASE_TABLET | Freq: Every day | ORAL | Status: DC
  Administered 2022-01-14 – 2022-01-19 (×6): 40 mg via ORAL
  Filled 2022-01-13 (×6): qty 1

## 2022-01-13 MED ORDER — ACETAMINOPHEN 325 MG PO TABS: 650.0000 mg | ORAL_TABLET | Freq: Four times a day (QID) | ORAL | Status: DC | PRN

## 2022-01-13 MED ORDER — DAPAGLIFLOZIN PROPANEDIOL 10 MG PO TABS
10.0000 mg | ORAL_TABLET | Freq: Every day | ORAL | Status: DC
  Administered 2022-01-14 – 2022-01-19 (×6): 10 mg via ORAL
  Filled 2022-01-13 (×6): qty 1

## 2022-01-13 MED ORDER — INSULIN ASPART 100 UNIT/ML IJ SOLN
0.0000 [IU] | Freq: Three times a day (TID) | INTRAMUSCULAR | Status: DC
  Administered 2022-01-14 (×2): 2 [IU] via SUBCUTANEOUS
  Administered 2022-01-15 (×2): 1 [IU] via SUBCUTANEOUS
  Administered 2022-01-16 (×2): 2 [IU] via SUBCUTANEOUS
  Administered 2022-01-17: 3 [IU] via SUBCUTANEOUS
  Administered 2022-01-18 – 2022-01-19 (×2): 1 [IU] via SUBCUTANEOUS

## 2022-01-13 MED ORDER — ALBUTEROL SULFATE HFA 108 (90 BASE) MCG/ACT IN AERS: 1.0000 | INHALATION_SPRAY | RESPIRATORY_TRACT | Status: DC | PRN

## 2022-01-13 MED ORDER — CLOPIDOGREL BISULFATE 75 MG PO TABS
75.0000 mg | ORAL_TABLET | Freq: Every day | ORAL | Status: DC
  Administered 2022-01-14 – 2022-01-19 (×6): 75 mg via ORAL
  Filled 2022-01-13 (×6): qty 1

## 2022-01-13 MED ORDER — INSULIN GLARGINE-YFGN 100 UNIT/ML ~~LOC~~ SOLN
20.0000 [IU] | Freq: Every day | SUBCUTANEOUS | Status: DC
  Administered 2022-01-13 – 2022-01-18 (×6): 20 [IU] via SUBCUTANEOUS
  Filled 2022-01-13 (×7): qty 0.2

## 2022-01-13 MED ORDER — ACETAMINOPHEN 650 MG RE SUPP: 650.0000 mg | Freq: Four times a day (QID) | RECTAL | Status: DC | PRN

## 2022-01-13 MED ORDER — ATORVASTATIN CALCIUM 80 MG PO TABS
80.0000 mg | ORAL_TABLET | Freq: Every day | ORAL | Status: DC
  Administered 2022-01-14 – 2022-01-19 (×6): 80 mg via ORAL
  Filled 2022-01-13 (×6): qty 1

## 2022-01-13 MED ORDER — ALLOPURINOL 100 MG PO TABS
100.0000 mg | ORAL_TABLET | Freq: Every day | ORAL | Status: DC
  Administered 2022-01-13 – 2022-01-19 (×7): 100 mg via ORAL
  Filled 2022-01-13 (×7): qty 1

## 2022-01-13 MED ORDER — UMECLIDINIUM-VILANTEROL 62.5-25 MCG/ACT IN AEPB
1.0000 | INHALATION_SPRAY | Freq: Every day | RESPIRATORY_TRACT | Status: DC
  Administered 2022-01-15 – 2022-01-18 (×3): 1 via RESPIRATORY_TRACT
  Filled 2022-01-13: qty 14

## 2022-01-13 MED ORDER — SODIUM CHLORIDE 0.9% FLUSH
3.0000 mL | Freq: Two times a day (BID) | INTRAVENOUS | Status: DC
  Administered 2022-01-13 – 2022-01-19 (×12): 3 mL via INTRAVENOUS

## 2022-01-13 MED ORDER — FUROSEMIDE 10 MG/ML IJ SOLN
40.0000 mg | Freq: Two times a day (BID) | INTRAMUSCULAR | Status: DC
  Administered 2022-01-14 – 2022-01-17 (×7): 40 mg via INTRAVENOUS
  Filled 2022-01-13 (×8): qty 4

## 2022-01-13 MED ORDER — OMEPRAZOLE MAGNESIUM 20 MG PO TBEC: 20.0000 mg | DELAYED_RELEASE_TABLET | Freq: Every day | ORAL | Status: DC

## 2022-01-13 MED ORDER — CARVEDILOL 12.5 MG PO TABS: 12.5000 mg | ORAL_TABLET | Freq: Two times a day (BID) | ORAL | Status: DC

## 2022-01-13 MED ORDER — ISOSORBIDE MONONITRATE ER 30 MG PO TB24
30.0000 mg | ORAL_TABLET | Freq: Every day | ORAL | Status: DC
  Administered 2022-01-14 – 2022-01-19 (×6): 30 mg via ORAL
  Filled 2022-01-13 (×6): qty 1

## 2022-01-13 MED ORDER — ASPIRIN 325 MG PO TABS
325.0000 mg | ORAL_TABLET | Freq: Every day | ORAL | Status: DC
  Administered 2022-01-13: 325 mg via ORAL
  Filled 2022-01-13: qty 1

## 2022-01-13 MED ORDER — ALBUTEROL SULFATE (2.5 MG/3ML) 0.083% IN NEBU
2.5000 mg | INHALATION_SOLUTION | RESPIRATORY_TRACT | Status: DC | PRN
  Administered 2022-01-15: 2.5 mg via RESPIRATORY_TRACT
  Filled 2022-01-13: qty 3

## 2022-01-13 MED ORDER — LEVOTHYROXINE SODIUM 100 MCG PO TABS
100.0000 ug | ORAL_TABLET | Freq: Every morning | ORAL | Status: DC
  Administered 2022-01-14 – 2022-01-19 (×6): 100 ug via ORAL
  Filled 2022-01-13 (×6): qty 1

## 2022-01-13 NOTE — Consult Note (Signed)
Cardiology Consultation:   Patient ID: [name] MRN: [id number]; DOB: 06/10/1932  Admit date: 01/13/2022 Date of Consult: 01/13/2022  PCP:  [name], MD   [facility] Providers Cardiologist:  None        Patient Profile:   Jacob Silva is a 88 y.o. male with a hx of CAD s/p 3V CABG (LIMA-distal LAD, SVG-PDA and OM1) in 2013 at [facility] and PCI (?) T2DM, CKD, HTN, HLD, COPD on 2L O2 at [facility] is being seen 01/13/2022 for the evaluation of SOB at the request of Dr. Delice Bison.  History of Present Illness:   Jacob Silva states worsening SOB especially during sleep or exertion for a week. He reports another episode of severe shortness of breath while he went to restroom today. He also reports lightheadedness, dizziness, cough, abdominal fullness, worsening LE edema and weight gain (12lbs in 2 weeks). He states has never had chest pain even before his CABG and PCI. Denies fever, chills, syncope, chest discomfort, heart palpitations, nausea, vomiting, dysuria, diarrhea or any bleeding events. He was initially started on CPAP at our ED. Currently, he is on 4L [place], no acute distress, talking to his god-son at bedside.   The patient has his cardiology care at [facility]. He says he had an abnormal stress test two months ago.   On admission K 4.7, Cr 2.9, troponin 119->310 ECG ectopic rhythm, TWI V1-V4, BNP 529.5 CXR Cardiomegaly with pulmonary vascular congestion and robable interstitial edema.  Past Medical History:  Diagnosis Date   CHF (congestive heart failure) (HCC)    Diabetes ([place])    HBP (high blood pressure)     Past Surgical History:  Procedure Laterality Date   BACK SURGERY     HERNIA REPAIR     KNEE SURGERY Right    TRIPLE BYPASS         Inpatient Medications: Scheduled Meds:  allopurinol  100 mg Oral Daily   aspirin  325 mg Oral Daily   [START ON 01/14/2022] atorvastatin  80 mg Oral Daily   [START ON 01/14/2022] carvedilol  12.5 mg Oral BID WC   [START  ON 01/14/2022] clopidogrel  75 mg Oral Daily   [START ON 01/14/2022] dapagliflozin propanediol  10 mg Oral Daily   [START ON 01/14/2022] furosemide  40 mg Intravenous BID   gabapentin  400 mg Oral BID   [START ON 01/14/2022] insulin aspart  0-9 Units Subcutaneous TID WC   insulin glargine-yfgn  20 Units Subcutaneous QHS   [START ON 01/14/2022] isosorbide mononitrate  30 mg Oral Daily   [START ON 01/14/2022] levothyroxine  100 mcg Oral q morning   [START ON 01/14/2022] pantoprazole  40 mg Oral Daily   sodium chloride flush  3 mL Intravenous Q12H   [START ON 01/14/2022] umeclidinium-vilanterol  1 puff Inhalation Daily   Continuous Infusions:  heparin 1,300 Units/hr (01/13/22 1950)   PRN Meds: acetaminophen **OR** acetaminophen, albuterol, polyethylene glycol  Allergies:    Allergies  Allergen Reactions   Benzonatate Other (See Comments)   Penicillin G Other (See Comments)   Penicillins Swelling   Tramadol     Other reaction(s): Dizziness    Social History:   Social History   Socioeconomic History   Marital status: Married    Spouse name: Not on file   Number of children: Not on file   Years of education: Not on file   Highest education level: Not on file  Occupational History   Not on file  Tobacco Use   Smoking status: Never   Smokeless tobacco: Never  Substance and Sexual Activity   Alcohol use: No   Drug use: No   Sexual activity: Not on file  Other Topics Concern   Not on file  Social History Narrative   Not on file   Social Determinants of Health   Financial Resource Strain: Not on file  Food Insecurity: Not on file  Transportation Needs: Not on file  Physical Activity: Not on file  Stress: Not on file  Social Connections: Not on file  Intimate Partner Violence: Not on file    Family History:   No family history on file.   ROS:  Please see the history of present illness.   All other ROS reviewed and negative.     Physical Exam/Data:   Vitals:    01/13/22 1915 01/13/22 1918 01/13/22 1945 01/13/22 2126  BP:   129/63   Pulse: 72  93   Resp: 17  19 19   Temp:      SpO2: 100%  96%   Weight:  97.5 kg  102.3 kg  Height:  6' (1.829 m)  5\' 11"  (1.803 m)    Intake/Output Summary (Last 24 hours) at 01/13/2022 2228 Last data filed at 01/13/2022 1959 Gross per 24 hour  Intake 250 ml  Output 300 ml  Net -50 ml      01/13/2022    9:26 PM 01/13/2022    7:18 PM 07/21/2017   10:07 AM  Last 3 Weights  Weight (lbs) 225 lb 9.6 oz 215 lb 237 lb  Weight (kg) 102.331 kg 97.523 kg 107.502 kg     Body mass index is 31.46 kg/m.  General:  Well nourished, well developed, in no acute distress HEENT: normal Neck: JVD Vascular: No carotid bruits; Distal pulses 2+ bilaterally Cardiac:  normal S1, S2; RRR; no murmur  Lungs: rales present Abd: soft, nontender, no hepatomegaly  Ext: 2+ pitting edema LE bilaterally  Musculoskeletal:  No deformities, BUE and BLE strength normal and equal Skin: warm and dry  Neuro:  CNs 2-12 intact, no focal abnormalities noted Psych:  Normal affect   Relevant CV Studies:   Laboratory Data:  High Sensitivity Troponin:   Recent Labs  Lab 01/13/22 1552 01/13/22 1747 01/13/22 2056  TROPONINIHS 119* 318* 577*     Chemistry Recent Labs  Lab 01/13/22 1552 01/13/22 1716 01/13/22 2056  NA 140 145  --   K 4.7 3.9  --   CL 108  --   --   CO2 18*  --   --   GLUCOSE 139*  --   --   BUN 36*  --   --   CREATININE 2.90*  --   --   CALCIUM 8.4*  --   --   MG  --   --  1.8  GFRNONAA 20*  --   --   ANIONGAP 14  --   --     Recent Labs  Lab 01/13/22 1552  PROT 5.7*  ALBUMIN 3.1*  AST 51*  ALT 38  ALKPHOS 114  BILITOT 0.7   Lipids No results for input(s): "CHOL", "TRIG", "HDL", "LABVLDL", "LDLCALC", "CHOLHDL" in the last 168 hours.  Hematology Recent Labs  Lab 01/13/22 1552 01/13/22 1716  WBC 11.2*  --   RBC 4.72  --   HGB 12.5* 11.6*  HCT 38.5* 34.0*  MCV 81.6  --   MCH 26.5  --   MCHC 32.5   --  RDW 16.7*  --   PLT 163  --    Thyroid No results for input(s): "TSH", "FREET4" in the last 168 hours.  BNP Recent Labs  Lab 01/13/22 1552  BNP 529.5*    DDimer No results for input(s): "DDIMER" in the last 168 hours.   Radiology/Studies:  DG Chest Port 1 View  Result Date: 01/13/2022 CLINICAL DATA:  Shortness of breath. EXAM: PORTABLE CHEST 1 VIEW COMPARISON:  02/16/2013 radiographs FINDINGS: Cardiomegaly and CABG changes noted. Pulmonary vascular congestion noted with interstitial opacities suggestive of interstitial edema. Small RIGHT pleural effusion/pleural thickening again noted. There is no evidence of pneumothorax or acute bony abnormality. IMPRESSION: Cardiomegaly with pulmonary vascular congestion and probable interstitial edema. Electronically Signed   By: Margarette Canada M.D.   On: 01/13/2022 16:26     Assessment and Plan:  #NSTEMI #hx of CAD s/p 3V CABG (LIMA-distal LAD, SVG-PDA and OM1) in 2013 at [facility] and PCI (?)  -DOE on admission can be angina equivalent, though COPD exacerbation may play a role as well. On admission, ECG TWI V1-V4 (unknown new or old), troponins continue rising -EKG prn -continue home ASA and plavix, lipitor -continue coreg (patient is warm on exam) and imdur -unknown Ivabradine is his home med -agree with heparin gtt -risk stratification with A1C, TSH and lipid panel -acquire a TTE am -plan invasive ischemic work up when his volume, respiratory status and renal function improve. I spoke with the patient and his god-son at bedside, they will discuss with patient's wife when she is here in the morning.   #Acute on chronic HF (unknown LVEF) -strict I/O, daily weight/UOP, replete electrolytes as needed -acquire a TTE -diuresis per primary team, goal negative 2-3L/day -continue home coreg and farxiga, home benazepril was held  Risk Assessment/Risk Scores:     TIMI Risk Score for Unstable Angina or Non-ST Elevation MI:   The patient's TIMI risk  score is 6, which indicates a 41% risk of all cause mortality, new or recurrent myocardial infarction or need for urgent revascularization in the next 14 days.  New York Heart Association (NYHA) Functional Class NYHA Class III  For questions or updates, please contact [facility] Please consult [email or web address] for contact info under    Signed, Laurice Record, MD  01/13/2022 10:28 PM

## 2022-01-13 NOTE — ED Notes (Signed)
Attempted to call minilab about I-stat VBG results, no answer at this time

## 2022-01-13 NOTE — H&P (Addendum)
History and Physical   Jacob Silva:[id number] DOB: [birth year] DOA: 01/13/2022  PCP: [name], MD   Patient coming from: Home  Chief Complaint: Sudden onset dyspnea  HPI: Jacob Silva is a 88 y.o. male with medical history significant of CAD status post CABG, CKD 3, diabetes, gout, hyperlipidemia, hypertension, hypothyroidism, anemia, coagulopathy, COPD presenting with sudden onset shortness of breath.  Patient has some degree of chronic respiratory failure on 2 L at home.  Today, when having to get lunch with his wife, he went to the restroom and suddenly had significant onset shortness of breath.  EMS was called and patient noted to be pale and hypoxic with increased work of breathing on their arrival.  Improvement with nitro x3.  He was placed on CPAP with improvement in saturations and transported to the ED.  He reports some cough for the past few days.  He also reports some mild increase in his shortness of breath and lower extremity edema for the past couple weeks.  Wife has noticed some increased dyspnea on exertion in the last week.  He has however had 2 of these more severe episodes of dyspnea 1 this morning and then the second 1 when they were going to be heading to get lunch.  Denies specific chest pain.  He states that he never really had must chest pain despite his history of CAD and CABG.  He states he typically has had only shortness of breath, this appears to be his anginal equivalent historically.  Denies fevers, chills, abdominal pain, constipation, diarrhea, nausea, vomiting.  ED Course: Vital signs in the ED significant for heart rate in the 50s to 60s, blood pressure initially 86 Solyx improved to the 100s to 110s possibly with nitro wearing off, initially on CPAP to maintain saturations weaned to 4 L.  Lab work-up included CMP with bicarb of 18, BUN of 36, creatinine of 2.19 up from baseline of around 1.9, glucose 139, calcium 8.4, protein 5.7, albumin 3.1,  AST 51.  CBC with mild leukocytosis to 11.2 but this is near his baseline, hemoglobin stable at 12.5.  Troponin elevated at 119 and then 310 on repeat.  BNP elevated to 529 chair.  COVID screening pending.  VBG with pH and PCO2 normal.  Chest x-ray showed cardiomegaly with vascular congestion and probable interstitial edema.  EKG showed sinus versus ectopic rhythm at 86 bpm new T wave inversions please compare to the most recent studies we have an in anterior leads with T wave flattening in lateral leads.  Patient received Lasix, Solu-Medrol, DuoNeb, heparin in the ED.  EDP has agreed with consulting cardiology to see the patient and for recommendations.  Of note, patient gets care through the [place] and some records are difficult to access.  Review of Systems: As per HPI otherwise all other systems reviewed and are negative.  Past Medical History:  Diagnosis Date   CHF (congestive heart failure) (HCC)    Diabetes ([place])    HBP (high blood pressure)     Past Surgical History:  Procedure Laterality Date   BACK SURGERY     HERNIA REPAIR     KNEE SURGERY Right    TRIPLE BYPASS      Social History  reports that he has never smoked. He has never used smokeless tobacco. He reports that he does not drink alcohol and does not use drugs.  Allergies  Allergen Reactions   Benzonatate Other (See Comments)   Penicillin G Other (See  Comments)   Penicillins Swelling   Tramadol     Other reaction(s): Dizziness    No family history on file.  Prior to Admission medications   Medication Sig Start Date End Date Taking? Authorizing Provider  acetaminophen (TYLENOL) 325 MG tablet Take by mouth.    [provider]  albuterol (VENTOLIN HFA) 108 (90 Base) MCG/ACT inhaler  10/12/13   [provider]  Alcohol Swabs (ALCOHOL WIPES) 70 % PADS IV Prep Wipes medicated    [provider]  allopurinol (ZYLOPRIM) 100 MG tablet Take 1 tablet by mouth daily. 02/13/21   [provider]  aspirin 325 MG tablet Take 325 mg by mouth daily.    [provider]  atorvastatin (LIPITOR) 80 MG tablet Take by mouth.    [provider]  BD PEN NEEDLE MICRO U/F 32G X 6 MM MISC SMARTSIG:1 Pre-Filled Pen Syringe SUB-Q Every Night 05/13/19   [provider]  benazepril (LOTENSIN) 5 MG tablet Take 5 mg by mouth daily. 03/01/20   [provider]  Blood Glucose Monitoring Suppl (FREESTYLE LITE) DEVI 2 (two) times daily. for testing 02/16/15   [provider]  butalbital-acetaminophen-caffeine (FIORICET, ESGIC) 50-325-40 MG per tablet Take by mouth. 09/16/13   [provider]  carvedilol (COREG) 25 MG tablet Take by mouth.    [provider]  clopidogrel (PLAVIX) 75 MG tablet Take by mouth.    [provider]  colchicine 0.6 MG tablet Take by mouth. 01/08/21   [provider]  dapagliflozin propanediol (FARXIGA) 10 MG TABS tablet  03/14/21   [provider]  diclofenac Sodium (VOLTAREN) 1 % GEL APPLY 4 GRAMS TOPICALLY FOUR TIMES A DAY AS NEEDED FOR PAIN 02/05/21   [provider]  diphenoxylate-atropine (LOMOTIL) 2.5-0.025 MG tablet diphenoxylate-atropine 2.5 mg-0.025 mg tablet    [provider]  fluconazole (DIFLUCAN) 200 MG tablet fluconazole 200 mg tablet    [provider]  fluorometholone (FML) 0.1 % ophthalmic suspension fluorometholone 0.1 % eye drops,suspension    [provider]  FREESTYLE LITE test strip TEST AS DIRECTED TWICE A DAY 02/16/15   [provider]  furosemide (LASIX) 20 MG tablet Take by mouth.    [provider]  gabapentin (NEURONTIN) 400 MG capsule Take by mouth.    [provider]  Influenza vac split quadrivalent PF (FLUZONE HIGH-DOSE) 0.5 ML injection Fluzone High-Dose 2018-2019 (PF) 180 mcg/0.5 mL intramuscular syringe  inject 0.5 milliliter intramuscularly    [provider]  insulin aspart (NOVOLOG) 100 UNIT/ML  SOCT cartridge Inject 30 Units into the skin once.    [provider]  isosorbide mononitrate (IMDUR) 30 MG 24 hr tablet Take by mouth.    [provider]  ketoconazole (NIZORAL) 2 % cream Apply 1 application. topically daily. 10/24/21   Criselda Peaches, DPM  Lancets (FREESTYLE) lancets TEST AS DIRECTED TWICE A DAY 02/16/15   [provider]  LANTUS SOLOSTAR 100 UNIT/ML Solostar Pen  10/07/15   [provider]  levothyroxine (SYNTHROID) 100 MCG tablet Take 100 mcg by mouth every morning. 12/11/20   [provider]  loperamide (IMODIUM) 2 MG capsule Take by mouth. 03/13/20   [provider]  methylPREDNISolone (MEDROL DOSEPAK) 4 MG TBPK tablet 6 day dose pack - take as directed 08/07/21   Edrick Kins, DPM  Multiple Vitamin (MULTI-VITAMINS) TABS Take by mouth.    [provider]  nitroGLYCERIN (NITROSTAT) 0.4 MG SL tablet Place under the  tongue. 09/04/13   [provider]  NON FORMULARY Preformist 75ml/vial once Q12 hours    [provider]  omeprazole (PRILOSEC OTC) 20 MG tablet Take by mouth.    [provider]  ondansetron (ZOFRAN) 8 MG tablet Take 8 mg by mouth every 8 (eight) hours as needed. 03/13/20   [provider]  pneumococcal 13-valent conjugate vaccine (PREVNAR 13) SUSP injection Prevnar 13 (PF) 0.5 mL intramuscular syringe  inject 0.5 milliliter intramuscularly    [provider]  potassium chloride SA (K-DUR,KLOR-CON) 20 MEQ tablet potassium chloride ER 20 mEq tablet,extended release(part/cryst)    [provider]  tiotropium (SPIRIVA HANDIHALER) 18 MCG inhalation capsule Place into inhaler and inhale. 06/12/17 06/12/18  [provider]  traMADol (ULTRAM) 50 MG tablet tramadol 50 mg tablet    [provider]  umeclidinium-vilanterol (ANORO ELLIPTA) 62.5-25 MCG/INH AEPB Inhale into the lungs.    [provider]  Vitamin D, Ergocalciferol, (DRISDOL)  50000 UNITS CAPS capsule Take by mouth.    [provider]    Physical Exam: Vitals:   01/13/22 1810 01/13/22 1830 01/13/22 1915 01/13/22 1918  BP: 119/71 119/74    Pulse: 65 68 72   Resp: 20 16 17    Temp:      SpO2: 97% 98% 100%   Weight:    97.5 kg  Height:    6' (1.829 m)    Physical Exam Constitutional:      General: He is not in acute distress.    Appearance: Normal appearance.  HENT:     Head: Normocephalic and atraumatic.     Mouth/Throat:     Mouth: Mucous membranes are moist.     Pharynx: Oropharynx is clear.  Eyes:     Extraocular Movements: Extraocular movements intact.     Pupils: Pupils are equal, round, and reactive to light.  Cardiovascular:     Rate and Rhythm: Normal rate and regular rhythm.     Pulses: Normal pulses.     Heart sounds: Murmur heard.  Pulmonary:     Effort: Pulmonary effort is normal. No respiratory distress.     Breath sounds: Rales present.  Abdominal:     General: Bowel sounds are normal. There is no distension.     Palpations: Abdomen is soft.     Tenderness: There is no abdominal tenderness.  Musculoskeletal:        General: No swelling or deformity.     Right lower leg: Edema present.     Left lower leg: Edema present.  Skin:    General: Skin is warm and dry.  Neurological:     General: No focal deficit present.     Mental Status: Mental status is at baseline.    Labs on Admission: I have personally reviewed following labs and imaging studies  CBC: Recent Labs  Lab 01/13/22 1552 01/13/22 1716  WBC 11.2*  --   HGB 12.5* 11.6*  HCT 38.5* 34.0*  MCV 81.6  --   PLT 163  --     Basic Metabolic Panel: Recent Labs  Lab 01/13/22 1552 01/13/22 1716  NA 140 145  K 4.7 3.9  CL 108  --   CO2 18*  --   GLUCOSE 139*  --   BUN 36*  --   CREATININE 2.90*  --   CALCIUM 8.4*  --     GFR: Estimated Creatinine Clearance: 21.3 mL/min (A) (by C-G formula based on SCr of 2.9 mg/dL (H)).  Liver  Function  Tests: Recent Labs  Lab 01/13/22 1552  AST 51*  ALT 38  ALKPHOS 114  BILITOT 0.7  PROT 5.7*  ALBUMIN 3.1*    Urine analysis: No results found for: "COLORURINE", "APPEARANCEUR", "LABSPEC", "PHURINE", "GLUCOSEU", "HGBUR", "BILIRUBINUR", "KETONESUR", "PROTEINUR", "UROBILINOGEN", "NITRITE", "LEUKOCYTESUR"  Radiological Exams on Admission: DG Chest Port 1 View  Result Date: 01/13/2022 CLINICAL DATA:  Shortness of breath. EXAM: PORTABLE CHEST 1 VIEW COMPARISON:  02/16/2013 radiographs FINDINGS: Cardiomegaly and CABG changes noted. Pulmonary vascular congestion noted with interstitial opacities suggestive of interstitial edema. Small RIGHT pleural effusion/pleural thickening again noted. There is no evidence of pneumothorax or acute bony abnormality. IMPRESSION: Cardiomegaly with pulmonary vascular congestion and probable interstitial edema. Electronically Signed   By: Margarette Canada M.D.   On: 01/13/2022 16:26    EKG: Independently reviewed.  Sinus versus ectopic rhythm at 86 bpm.  New T wave inversions anterior leads and T wave flattening lateral leads.  Most recent to compare is several years old.  Assessment/Plan Principal Problem:   Acute on chronic respiratory failure with hypoxia (HCC) Active Problems:   CAD (coronary artery disease)   Diabetes mellitus type II, controlled ([place])   Gout   HTN (hypertension)   Hypercholesterolemia   Hypothyroidism   Chronic obstructive pulmonary disease, unspecified (HCC)   Acute renal failure superimposed on stage 3b chronic kidney disease (HCC)   CHF exacerbation (HCC)   NSTEMI (non-ST elevated myocardial infarction) ([place])   Acute on chronic respiratory failure with hypoxia CHF exacerbation NSTEMI CAD, status post CABG Hyperlipidemia > Patient with known history of CAD status post CABG and history of CHF. > Records reviewed able to determine patient's status post CABG and is currently remaining on aspirin and Plavix per her most recent [place]  records.  Unclear most recent echo, records from 2014 show EF 58%.  Wife states that she keeps his records and a binder and will bring in his information tomorrow. > Patient presenting with sudden onset respiratory failure while eating lunch today after going to the bathroom. > Noted to have troponin elevation to 119 and 310 on repeat with BNP elevated to 529.  Chest x-ray with cardiomegaly vascular congestion and probable interstitial edema.  EKG with T wave inversions in anterior leads which appear to be new though no recent EKG to compare.  > Concern for NSTEMI and CHF exacerbation.  Does have some gradual onset of CHF symptoms however his intense episodes of dyspnea and history of anginal equivalents of dyspnea make NSTEMI contributing more concerning. > Initial blood pressure in the 31S systolic however this may have been due to nitro received prior to arrival.  Nitro did relieve his symptoms prior to arrival which also points towards possible ACS. > Received a dose of Lasix in the ED. > Cardiology is being consulted by EDP, await recommendations. - Appreciate cardiology recommendations - Monitor on progressive unit For CHF exacerbation - Plan to continue with Lasix IV twice daily - Strict I's and O's, daily weights - Echocardiogram - Check magnesium - Trend renal function and electrolytes - Continue home empagliflozin, carvedilol  - Hold  Ivabradine until confirmed, possible a new med, appears in recent records For NSTEMI/CAD - Continue with heparin as ordered - Trend troponin to peak - As needed EKG - Continue home aspirin, Plavix - Continue home carvedilol, Imdur, atorvastatin, - Hold off on ivabradine until confirmed by wife - Holding home benazepril  AKI on CKD 3 > Creatinine limited 2.9 from baseline of 1.9 per  chart review. > This is in the setting of presumed CHF exacerbation with edema and rales noted. > Plan is to monitor response to diuresis - Diuresis as above - Trend  renal function electrolytes - Avoid nephrotoxic agents  COPD - Continue home Anoro Ellipta - Continue as needed albuterol  Diabetes > 35 units nightly at home - 20 units nightly - SSI  Gout - Continue home allopurinol  Hypertension > Low normal blood pressure in the ED initially in the 80s after receiving nitro now in the 465K to 354S systolic - Receiving Lasix as above - We will continue with home carvedilol - Holding home benazepril   Anemia Leukocytosis > Hemoglobin appears stable at 12.5 > Mild leukocytosis 11.2, no infectious etiology elucidated thus far. - Continue to trend CBC - Monitor fever curve  Hypothyroidism - Continue home Synthroid  DVT prophylaxis: Heparin  Code Status:   DNR Family Communication:  Wife updated at bedside Disposition Plan:   Patient is from:  Home  Anticipated DC to:  Home  Anticipated DC date:  2 to 5 days  Anticipated DC barriers: None  Consults called:  Cardiology, being consulted by EDP Admission status:  Inpatient, progressive  Severity of Illness: The appropriate patient status for this patient is INPATIENT. Inpatient status is judged to be reasonable and necessary in order to provide the required intensity of service to ensure the patient's safety. The patient's presenting symptoms, physical exam findings, and initial radiographic and laboratory data in the context of their chronic comorbidities is felt to place them at high risk for further clinical deterioration. Furthermore, it is not anticipated that the patient will be medically stable for discharge from the hospital within 2 midnights of admission.   * I certify that at the point of admission it is my clinical judgment that the patient will require inpatient hospital care spanning beyond 2 midnights from the point of admission due to high intensity of service, high risk for further deterioration and high frequency of surveillance required.Marcelyn Bruins MD [facility]  How to contact the [facility] Attending or Consulting provider [place] or covering provider during after hours [place], for this patient?   Check the care team in [facility] and look for a) attending/consulting [facility] provider listed and b) the [facility] team listed Log into [email or web address] and use [place]'s universal password to access. If you do not have the password, please contact the hospital operator. Locate the [facility] provider you are looking for under [facility] and page to a number that you can be directly reached. If you still have difficulty reaching the provider, please page the [facility] (Director on Call) for the Hospitalists listed on amion for assistance.  01/13/2022, 7:45 PM

## 2022-01-13 NOTE — ED Notes (Signed)
Pts wife removed pts cpap stating it was too uncomfortable, placed pt on 4L [place] and notified MD

## 2022-01-13 NOTE — ED Notes (Signed)
MD at bedside providing update on plan of care to pt and pts wife

## 2022-01-13 NOTE — Progress Notes (Signed)
Placed patient on BiPAP18/8 10L bleed in O2 via [employer], patient tolerated well, SATS improved to 97%, will continue to monitor patient.

## 2022-01-13 NOTE — ED Notes (Signed)
Holding lasix at this time due to BP of 87/54, possibly due to nitro received PTA will continue to monitor, notified MD

## 2022-01-13 NOTE — ED Triage Notes (Signed)
Pt arrives via EMS with c/o SOB. Pt has history of CHF Per EMS pt had rales in all lung fields initially  Given 3 nitro and put on CPAP en route  Vitals en route BP  110/80  (post nitro) 85% RA initially --> 96% on CPAP  MD and ED tech, RN and RT in room at triage

## 2022-01-13 NOTE — Progress Notes (Signed)
ANTICOAGULATION CONSULT NOTE - Initial Consult  Pharmacy Consult for Heparin Indication: chest pain/ACS  Allergies  Allergen Reactions   Benzonatate Other (See Comments)   Penicillin G Other (See Comments)   Penicillins Swelling   Tramadol     Other reaction(s): Dizziness    Patient Measurements:   Heparin Dosing Weight: 97.2 kg  Vital Signs: BP: 119/74 (06/18 1830) Pulse Rate: 68 (06/18 1830)  Labs: Recent Labs    01/13/22 1552 01/13/22 1716 01/13/22 1747  HGB 12.5* 11.6*  --   HCT 38.5* 34.0*  --   PLT 163  --   --   CREATININE 2.90*  --   --   TROPONINIHS 119*  --  318*    CrCl cannot be calculated (Unknown ideal weight.).   Medical History: Past Medical History:  Diagnosis Date   CHF (congestive heart failure) (HCC)    Diabetes (HCC)    HBP (high blood pressure)     Medications:  (Not in a hospital admission)  Scheduled:   allopurinol  100 mg Oral Daily   aspirin  325 mg Oral Daily   [START ON 01/14/2022] atorvastatin  80 mg Oral Daily   [START ON 01/14/2022] carvedilol  12.5 mg Oral BID WC   [START ON 01/14/2022] clopidogrel  75 mg Oral Daily   [START ON 01/14/2022] dapagliflozin propanediol  10 mg Oral Daily   [START ON 01/14/2022] furosemide  40 mg Intravenous BID   gabapentin  400 mg Oral BID   [START ON 01/14/2022] isosorbide mononitrate  30 mg Oral Daily   [START ON 01/14/2022] levothyroxine  100 mcg Oral q morning   [START ON 01/14/2022] omeprazole  20 mg Oral Daily   sodium chloride flush  3 mL Intravenous Q12H   [START ON 01/14/2022] umeclidinium-vilanterol  1 puff Inhalation Daily   Infusions:  PRN: acetaminophen **OR** acetaminophen, albuterol, polyethylene glycol  Assessment: 87 yom with a history of CAD, s/p CABG, CKD 3, DM, gout, HLD, HTN, hypothyroidism, anemia, coagulopathy, COPD. Patient is presenting with SOB. Heparin per pharmacy consult placed for chest pain/ACS.  Patient is not on anticoagulation prior to arrival.  Hgb 12.5; plt  163 hsTrop 119>318  Goal of Therapy:  Heparin level 0.3-0.7 units/ml Monitor platelets by anticoagulation protocol: Yes   Plan:  Give 4000 units bolus x 1 Start heparin infusion at 1300 units/hr Check anti-Xa level in 8 hours and daily while on heparin Continue to monitor H&H and platelets  Lorelei Pont, PharmD, BCPS 01/13/2022 7:14 PM ED Clinical Pharmacist -  [phone]

## 2022-01-13 NOTE — ED Provider Notes (Signed)
[facility] EMERGENCY DEPARTMENT Provider Note   CSN: [id number] Arrival date & time: 01/13/22  1529     History  Chief Complaint  Patient presents with   Respiratory Distress    Jacob Silva is a 88 y.o. male.  88 year old male with a past medical history of CABG in 2003, severe HFrEF, hypertension and hyperlipidemia, insulin dependent diabetes presents to the ED via EMS with respiratory distress. Patient was eating lunch with his wife when he went to the restroom and suddenly became short of breath.  On EMS arrival, patient was pale and hypoxic with significant increased work of breathing. He was given x3 nitroglycerins without improvement in his symptoms. He was ultimately placed on CPAP at 5 with improvement in his work of breathing and saturations. He reports non-productive cough over the last few days but denies fevers or chest pain.  The history is provided by the EMS personnel and medical records.       Home Medications Prior to Admission medications   Medication Sig Start Date End Date Taking? Authorizing Provider  acetaminophen (TYLENOL) 325 MG tablet Take by mouth.    [provider]  albuterol (VENTOLIN HFA) 108 (90 Base) MCG/ACT inhaler  10/12/13   [provider]  Alcohol Swabs (ALCOHOL WIPES) 70 % PADS IV Prep Wipes medicated    [provider]  allopurinol (ZYLOPRIM) 100 MG tablet Take 1 tablet by mouth daily. 02/13/21   [provider]  aspirin 325 MG tablet Take 325 mg by mouth daily.    [provider]  atorvastatin (LIPITOR) 80 MG tablet Take by mouth.    [provider]  BD PEN NEEDLE MICRO U/F 32G X 6 MM MISC SMARTSIG:1 Pre-Filled Pen Syringe SUB-Q Every Night 05/13/19   [provider]  benazepril (LOTENSIN) 5 MG tablet Take 5 mg by mouth daily. 03/01/20   [provider]  Blood Glucose Monitoring Suppl (FREESTYLE LITE) DEVI 2 (two) times daily. for testing 02/16/15    [provider]  butalbital-acetaminophen-caffeine (FIORICET, ESGIC) 50-325-40 MG per tablet Take by mouth. 09/16/13   [provider]  carvedilol (COREG) 25 MG tablet Take by mouth.    [provider]  clopidogrel (PLAVIX) 75 MG tablet Take by mouth.    [provider]  colchicine 0.6 MG tablet Take by mouth. 01/08/21   [provider]  dapagliflozin propanediol (FARXIGA) 10 MG TABS tablet  03/14/21   [provider]  diclofenac Sodium (VOLTAREN) 1 % GEL APPLY 4 GRAMS TOPICALLY FOUR TIMES A DAY AS NEEDED FOR PAIN 02/05/21   [provider]  diphenoxylate-atropine (LOMOTIL) 2.5-0.025 MG tablet diphenoxylate-atropine 2.5 mg-0.025 mg tablet    [provider]  fluconazole (DIFLUCAN) 200 MG tablet fluconazole 200 mg tablet    [provider]  fluorometholone (FML) 0.1 % ophthalmic suspension fluorometholone 0.1 % eye drops,suspension    [provider]  FREESTYLE LITE test strip TEST AS DIRECTED TWICE A DAY 02/16/15   [provider]  furosemide (LASIX) 20 MG tablet Take by mouth.    [provider]  gabapentin (NEURONTIN) 400 MG capsule Take by mouth.    [provider]  Influenza vac split quadrivalent PF (FLUZONE HIGH-DOSE) 0.5 ML injection Fluzone High-Dose 2018-2019 (PF) 180 mcg/0.5 mL intramuscular syringe  inject 0.5 milliliter intramuscularly    [provider]  insulin aspart (NOVOLOG) 100 UNIT/ML SOCT cartridge Inject 30 Units into the skin once.    [provider]  isosorbide mononitrate (IMDUR) 30 MG 24 hr tablet Take by mouth.    [provider]  ketoconazole (NIZORAL) 2 % cream Apply 1 application. topically daily. 10/24/21   Criselda Peaches, DPM  Lancets (FREESTYLE) lancets TEST AS DIRECTED TWICE A DAY 02/16/15   [provider]  LANTUS SOLOSTAR 100 UNIT/ML Solostar Pen  10/07/15   [provider]  levothyroxine (SYNTHROID) 100 MCG  tablet Take 100 mcg by mouth every morning. 12/11/20   [provider]  loperamide (IMODIUM) 2 MG capsule Take by mouth. 03/13/20   [provider]  methylPREDNISolone (MEDROL DOSEPAK) 4 MG TBPK tablet 6 day dose pack - take as directed 08/07/21   Edrick Kins, DPM  Multiple Vitamin (MULTI-VITAMINS) TABS Take by mouth.    [provider]  nitroGLYCERIN (NITROSTAT) 0.4 MG SL tablet Place under the tongue. 09/04/13   [provider]  NON FORMULARY Preformist 28ml/vial once Q12 hours    [provider]  omeprazole (PRILOSEC OTC) 20 MG tablet Take by mouth.    [provider]  ondansetron (ZOFRAN) 8 MG tablet Take 8 mg by mouth every 8 (eight) hours as needed. 03/13/20   [provider]  pneumococcal 13-valent conjugate vaccine (PREVNAR 13) SUSP injection Prevnar 13 (PF) 0.5 mL intramuscular syringe  inject 0.5 milliliter intramuscularly    [provider]  potassium chloride SA (K-DUR,KLOR-CON) 20 MEQ tablet potassium chloride ER 20 mEq tablet,extended release(part/cryst)    [provider]  tiotropium (SPIRIVA HANDIHALER) 18 MCG inhalation capsule Place into inhaler and inhale. 06/12/17 06/12/18  [provider]  traMADol (ULTRAM) 50 MG tablet tramadol 50 mg tablet    [provider]  umeclidinium-vilanterol (ANORO ELLIPTA) 62.5-25 MCG/INH AEPB Inhale into the lungs.    [provider]  Vitamin D, Ergocalciferol, (DRISDOL) 50000 UNITS CAPS capsule Take by mouth.    [provider]      Allergies    Benzonatate, Penicillin g, Penicillins, and Tramadol    Review of Systems   Review of Systems  Unable to perform ROS: Acuity of condition    Physical Exam Updated Vital Signs BP 129/63   Pulse 93   Temp 98.2 F (36.8 C)   Resp 19   Ht 6' (1.829 m)   Wt 97.5 kg   SpO2 96%   BMI 29.16 kg/m  Physical Exam Vitals and nursing note reviewed.  Constitutional:      General: He is  in acute distress.     Appearance: Normal appearance. He is well-developed. He is not ill-appearing.  HENT:     Head: Normocephalic and atraumatic.  Eyes:     Conjunctiva/sclera: Conjunctivae normal.  Cardiovascular:     Rate and Rhythm: Normal rate and regular rhythm.     Pulses:          Radial pulses are 2+ on the right side and 2+ on the left side.       Dorsalis pedis pulses are 2+ on the right side and 2+ on the left side.     Heart sounds: Murmur heard.     Systolic murmur is present.  Pulmonary:     Effort: Pulmonary effort is normal.     Breath sounds: Examination of the right-lower field reveals rales. Examination of the left-lower field reveals rales. Rales present.     Comments: Prominent rales noted throughout the bibasilar region. Diffusely diminished breath sounds noted throughout the remainder of the lungs Abdominal:     Palpations:  Abdomen is soft.     Tenderness: There is no abdominal tenderness.  Musculoskeletal:        General: No swelling.     Cervical back: Neck supple.     Right lower leg: 2+ Pitting Edema present.     Left lower leg: 2+ Pitting Edema present.  Skin:    General: Skin is warm and dry.     Capillary Refill: Capillary refill takes less than 2 seconds.  Neurological:     Mental Status: He is alert.  Psychiatric:        Mood and Affect: Mood normal.     ED Results / Procedures / Treatments   Labs (all labs ordered are listed, but only abnormal results are displayed) Labs Reviewed  CBC - Abnormal; Notable for the following components:      Result Value   WBC 11.2 (*)    Hemoglobin 12.5 (*)    HCT 38.5 (*)    RDW 16.7 (*)    All other components within normal limits  COMPREHENSIVE METABOLIC PANEL - Abnormal; Notable for the following components:   CO2 18 (*)    Glucose, Bld 139 (*)    BUN 36 (*)    Creatinine, Ser 2.90 (*)    Calcium 8.4 (*)    Total Protein 5.7 (*)    Albumin 3.1 (*)    AST 51 (*)    GFR, Estimated 20 (*)     All other components within normal limits  BRAIN NATRIURETIC PEPTIDE - Abnormal; Notable for the following components:   B Natriuretic Peptide 529.5 (*)    All other components within normal limits  I-STAT VENOUS BLOOD GAS, ED - Abnormal; Notable for the following components:   Acid-base deficit 3.0 (*)    Calcium, Ion 1.01 (*)    HCT 34.0 (*)    Hemoglobin 11.6 (*)    All other components within normal limits  TROPONIN I (HIGH SENSITIVITY) - Abnormal; Notable for the following components:   Troponin I (High Sensitivity) 119 (*)    All other components within normal limits  TROPONIN I (HIGH SENSITIVITY) - Abnormal; Notable for the following components:   Troponin I (High Sensitivity) 318 (*)    All other components within normal limits  SARS CORONAVIRUS 2 BY RT PCR  COMPREHENSIVE METABOLIC PANEL  CBC  PROTIME-INR  HEPARIN LEVEL (UNFRACTIONATED)  MAGNESIUM  TROPONIN I (HIGH SENSITIVITY)    EKG EKG Interpretation  Date/Time:  Sunday January 13 2022 15:37:26 EDT Ventricular Rate:  86 PR Interval:  193 QRS Duration: 109 QT Interval:  387 QTC Calculation: 463 R Axis:   186 Text Interpretation: Sinus or ectopic atrial rhythm RVH with secondary repolarization abnrm T wave inversions v1-v4 Confirmed by Lavenia Atlas [phone]) on 01/13/2022 3:42:04 PM  Radiology DG Chest Port 1 View  Result Date: 01/13/2022 CLINICAL DATA:  Shortness of breath. EXAM: PORTABLE CHEST 1 VIEW COMPARISON:  02/16/2013 radiographs FINDINGS: Cardiomegaly and CABG changes noted. Pulmonary vascular congestion noted with interstitial opacities suggestive of interstitial edema. Small RIGHT pleural effusion/pleural thickening again noted. There is no evidence of pneumothorax or acute bony abnormality. IMPRESSION: Cardiomegaly with pulmonary vascular congestion and probable interstitial edema. Electronically Signed   By: Margarette [place] M.D.   On: 01/13/2022 16:26    Procedures Procedures    Medications Ordered in  ED Medications  aspirin tablet 325 mg (325 mg Oral Given 01/13/22 1847)  atorvastatin (LIPITOR) tablet 80 mg (has no administration in time range)  carvedilol (  COREG) tablet 12.5 mg (has no administration in time range)  isosorbide mononitrate (IMDUR) 24 hr tablet 30 mg (has no administration in time range)  dapagliflozin propanediol (FARXIGA) tablet 10 mg (has no administration in time range)  levothyroxine (SYNTHROID) tablet 100 mcg (has no administration in time range)  clopidogrel (PLAVIX) tablet 75 mg (has no administration in time range)  gabapentin (NEURONTIN) capsule 400 mg (has no administration in time range)  umeclidinium-vilanterol (ANORO ELLIPTA) 62.5-25 MCG/ACT 1 puff (has no administration in time range)  allopurinol (ZYLOPRIM) tablet 100 mg (has no administration in time range)  furosemide (LASIX) injection 40 mg (has no administration in time range)  sodium chloride flush (NS) 0.9 % injection 3 mL (has no administration in time range)  acetaminophen (TYLENOL) tablet 650 mg (has no administration in time range)    Or  acetaminophen (TYLENOL) suppository 650 mg (has no administration in time range)  polyethylene glycol (MIRALAX / GLYCOLAX) packet 17 g (has no administration in time range)  albuterol (PROVENTIL) (2.5 MG/3ML) 0.083% nebulizer solution 2.5 mg (has no administration in time range)  heparin ADULT infusion 100 units/mL (25000 units/269mL) (1,300 Units/hr Intravenous New Bag/Given 01/13/22 1950)  insulin glargine-yfgn (SEMGLEE) injection 20 Units (has no administration in time range)  insulin aspart (novoLOG) injection 0-9 Units (has no administration in time range)  pantoprazole (PROTONIX) EC tablet 40 mg (has no administration in time range)  furosemide (LASIX) injection 40 mg (40 mg Intravenous Given 01/13/22 1815)  ipratropium-albuterol (DUONEB) 0.5-2.5 (3) MG/3ML nebulizer solution 3 mL (3 mLs Nebulization Given 01/13/22 1738)  methylPREDNISolone sodium succinate  (SOLU-MEDROL) 125 mg/2 mL injection 125 mg (125 mg Intravenous Given 01/13/22 1741)  heparin bolus via infusion 4,000 Units (4,000 Units Intravenous Bolus from Bag 01/13/22 1950)    ED Course/ Medical Decision Making/ A&P                           Medical Decision Making Amount and/or Complexity of Data Reviewed Labs: ordered. Radiology: ordered.  Risk OTC drugs. Prescription drug management. Decision regarding hospitalization.   88 year old male with a history as above presents today in respiratory distress requiring CPAP.  On arrival, patient appears clinically volume overloaded with rales extending up to the midlung bilaterally and 2+ bilateral pitting edema. Suspect underlying heart failure exacerbation vs COPD exacerbation. He is otherwise hemodynamically stable, no hypertension that would support a flash pulmonary edema or SCAPE picture at this time.  EKG was reviewed and interpreted as above.  He does have evidence of precordial inverted T waves which are new when compared with EKG from 2014, thus ACS is an additional concern.  I reviewed and interpreted the patient's chest x-ray which is concerning for cardiomegaly with bilateral interstitial pulmonary edema.  Lab work reviewed notable for a slight leukocytosis at 11 which is nonspecific, hemoglobin appears to be at baseline.  VBG with acidosis with pH of 7.29. Bicarb on CMP 18 thus concern for metabolic source.  No hypercarbia.  When compared with outside hospital records, he also appears to have a new AKI on CKD.  Troponin is severely elevated at 119 -> 318 which in the setting of his flipped T waves, I'm concerned for ACS.  Patient was treated with BiPAP which he tolerated well, though he was noted to have several soft pressures.  We were able to transition him to 4 L nasal cannula with improvement.  Suspect transient hypotension in the setting of nitroglycerin ministration vs  positive pressure from BiPAP.  Once pressures improved, we  were able to diurese with IV Lasix.  We additionally began a heparin drip give aspirin due to concern for underlying ACS and NSTEMI.  Spoke with cardiology team who will plan to evaluate the patient.  Discussed with the inpatient medicine team who is agreeable to accept patient to their service.  Plan of care was discussed with patient and his wife at bedside who verbalized understanding.  No further acute events or interventions prior to transition of care to the inpatient team.  Final Clinical Impression(s) / ED Diagnoses Final diagnoses:  Respiratory distress  NSTEMI (non-ST elevated myocardial infarction) ([place])  Acute on chronic systolic congestive heart failure [facility])    Rx / DC Orders ED Discharge Orders     None         Shavaughn Seidl, [place], MD 01/13/22 2119    Lorelle Gibbs, DO 01/14/22 1742    Horton, Alvin Critchley, DO 01/14/22 1745

## 2022-01-14 ENCOUNTER — Inpatient Hospital Stay (HOSPITAL_COMMUNITY): Payer: No Typology Code available for payment source

## 2022-01-14 DIAGNOSIS — I5023 Acute on chronic systolic (congestive) heart failure: Secondary | ICD-10-CM

## 2022-01-14 DIAGNOSIS — R0609 Other forms of dyspnea: Secondary | ICD-10-CM | POA: Diagnosis not present

## 2022-01-14 DIAGNOSIS — J9621 Acute and chronic respiratory failure with hypoxia: Secondary | ICD-10-CM | POA: Diagnosis not present

## 2022-01-14 LAB — CBC
HCT: 39.5 % (ref 39.0–52.0)
Hemoglobin: 13.3 g/dL (ref 13.0–17.0)
MCH: 26.3 pg (ref 26.0–34.0)
MCHC: 33.7 g/dL (ref 30.0–36.0)
MCV: 78.2 fL — ABNORMAL LOW (ref 80.0–100.0)
Platelets: 164 10*3/uL (ref 150–400)
RBC: 5.05 MIL/uL (ref 4.22–5.81)
RDW: 16.6 % — ABNORMAL HIGH (ref 11.5–15.5)
WBC: 9.5 10*3/uL (ref 4.0–10.5)
nRBC: 0 % (ref 0.0–0.2)

## 2022-01-14 LAB — HEPARIN LEVEL (UNFRACTIONATED)
Heparin Unfractionated: 0.46 IU/mL (ref 0.30–0.70)
Heparin Unfractionated: 0.52 IU/mL (ref 0.30–0.70)

## 2022-01-14 LAB — GLUCOSE, CAPILLARY
Glucose-Capillary: 140 mg/dL — ABNORMAL HIGH (ref 70–99)
Glucose-Capillary: 165 mg/dL — ABNORMAL HIGH (ref 70–99)
Glucose-Capillary: 169 mg/dL — ABNORMAL HIGH (ref 70–99)
Glucose-Capillary: 196 mg/dL — ABNORMAL HIGH (ref 70–99)

## 2022-01-14 LAB — ECHOCARDIOGRAM COMPLETE
AR max vel: 2.49 cm2
AV Area VTI: 2.61 cm2
AV Area mean vel: 2.54 cm2
AV Mean grad: 3 mmHg
AV Peak grad: 5.4 mmHg
Ao pk vel: 1.16 m/s
Area-P 1/2: 1.7 cm2
Calc EF: 41.6 %
Height: 71 in
S' Lateral: 2.9 cm
Single Plane A2C EF: 42.8 %
Single Plane A4C EF: 37.3 %
Weight: 3629.65 oz

## 2022-01-14 LAB — COMPREHENSIVE METABOLIC PANEL
ALT: 35 U/L (ref 0–44)
AST: 31 U/L (ref 15–41)
Albumin: 3.2 g/dL — ABNORMAL LOW (ref 3.5–5.0)
Alkaline Phosphatase: 127 U/L — ABNORMAL HIGH (ref 38–126)
Anion gap: 12 (ref 5–15)
BUN: 39 mg/dL — ABNORMAL HIGH (ref 8–23)
CO2: 25 mmol/L (ref 22–32)
Calcium: 8.9 mg/dL (ref 8.9–10.3)
Chloride: 105 mmol/L (ref 98–111)
Creatinine, Ser: 2.93 mg/dL — ABNORMAL HIGH (ref 0.61–1.24)
GFR, Estimated: 20 mL/min — ABNORMAL LOW (ref >60)
Glucose, Bld: 201 mg/dL — ABNORMAL HIGH (ref 70–99)
Potassium: 4.8 mmol/L (ref 3.5–5.1)
Sodium: 142 mmol/L (ref 135–145)
Total Bilirubin: 0.3 mg/dL (ref 0.3–1.2)
Total Protein: 6.3 g/dL — ABNORMAL LOW (ref 6.5–8.1)

## 2022-01-14 LAB — TROPONIN I (HIGH SENSITIVITY)
Troponin I (High Sensitivity): 354 ng/L (ref <18)
Troponin I (High Sensitivity): 458 ng/L (ref <18)

## 2022-01-14 MED ORDER — CARVEDILOL 6.25 MG PO TABS
6.2500 mg | ORAL_TABLET | Freq: Two times a day (BID) | ORAL | Status: DC
  Administered 2022-01-14: 6.25 mg via ORAL
  Filled 2022-01-14 (×2): qty 1

## 2022-01-14 MED ORDER — ASPIRIN 81 MG PO TBEC
81.0000 mg | DELAYED_RELEASE_TABLET | Freq: Every day | ORAL | Status: DC
  Administered 2022-01-14 – 2022-01-19 (×6): 81 mg via ORAL
  Filled 2022-01-14 (×6): qty 1

## 2022-01-14 MED ORDER — CARVEDILOL 6.25 MG PO TABS: 6.2500 mg | ORAL_TABLET | Freq: Two times a day (BID) | ORAL | Status: DC

## 2022-01-14 NOTE — Progress Notes (Signed)
ANTICOAGULATION CONSULT NOTE -  Pharmacy Consult for Heparin Indication: chest pain/ACS   Allergies  Allergen Reactions   Penicillin G Other (See Comments)   Penicillins Swelling   Tramadol     Other reaction(s): Dizziness    Patient Measurements: Height: 5\' 11"  (180.3 cm) Weight: 102.9 kg (226 lb 13.7 oz) IBW/kg (Calculated) : 75.3 Heparin Dosing Weight: 97.2 kg  Vital Signs: Temp: 97.8 F (36.6 C) (06/19 0322) Temp Source: Oral (06/19 0322) BP: 133/84 (06/19 0322) Pulse Rate: 77 (06/19 0322)  Labs: Recent Labs    01/13/22 1552 01/13/22 1716 01/13/22 1747 01/13/22 2056 01/13/22 2219 01/14/22 0317 01/14/22 0903  HGB 12.5* 11.6*  --   --   --  13.3  --   HCT 38.5* 34.0*  --   --   --  39.5  --   PLT 163  --   --   --   --  164  --   LABPROT 16.0*  --   --   --   --   --   --   INR 1.3*  --   --   --   --   --   --   HEPARINUNFRC  --   --   --   --   --  0.46 0.52  CREATININE 2.90*  --   --   --   --  2.93*  --   TROPONINIHS 119*  --  318* 577* 641*  --   --      Estimated Creatinine Clearance: 21.3 mL/min (A) (by C-G formula based on SCr of 2.93 mg/dL (H)).   Assessment: 88 y.o. male admitted with ACS r/o and started on IV heparin. Confirmatory heparin level therapeutic, CBC stable.  Goal of Therapy:  Heparin level 0.3-0.7 units/ml Monitor platelets by anticoagulation protocol: Yes   Plan:  Continue heparin 1300 units/h Daily heparin level and CBC  Arrie Senate, PharmD, [place], [facility] Clinical Pharmacist [phone] Please check AMION for all [facility] numbers 01/14/2022

## 2022-01-14 NOTE — Progress Notes (Signed)
ANTICOAGULATION CONSULT NOTE -  Pharmacy Consult for Heparin Indication: chest pain/ACS Brief A/P: Heparin level within goal range Continue Heparin at current rate   Allergies  Allergen Reactions   Benzonatate Other (See Comments)   Penicillin G Other (See Comments)   Penicillins Swelling   Tramadol     Other reaction(s): Dizziness    Patient Measurements: Height: 5\' 11"  (180.3 cm) Weight: 102.3 kg (225 lb 9.6 oz) IBW/kg (Calculated) : 75.3 Heparin Dosing Weight: 97.2 kg  Vital Signs: Temp: 97.8 F (36.6 C) (06/19 0322) Temp Source: Oral (06/19 0322) BP: 133/84 (06/19 0322) Pulse Rate: 77 (06/19 0322)  Labs: Recent Labs    01/13/22 1552 01/13/22 1716 01/13/22 1747 01/13/22 2056 01/13/22 2219 01/14/22 0317  HGB 12.5* 11.6*  --   --   --  13.3  HCT 38.5* 34.0*  --   --   --  39.5  PLT 163  --   --   --   --  164  LABPROT 16.0*  --   --   --   --   --   INR 1.3*  --   --   --   --   --   HEPARINUNFRC  --   --   --   --   --  0.46  CREATININE 2.90*  --   --   --   --   --   TROPONINIHS 119*  --  318* 577* 641*  --      Estimated Creatinine Clearance: 21.4 mL/min (A) (by C-G formula based on SCr of 2.9 mg/dL (H)).   Assessment: 88 y.o. male with DOE, possible ACS for heparin   Goal of Therapy:  Heparin level 0.3-0.7 units/ml Monitor platelets by anticoagulation protocol: Yes   Plan:  Continue Heparin at current rate   Phillis Knack, PharmD, BCPS

## 2022-01-14 NOTE — Progress Notes (Signed)
[facility] night cross cover note:   I was notified by RN of the patient's request for resumption of home nocturnal CPAP in the setting of known history of obstructive sleep apnea.  I subsequently placed order for resumption of home nocturnal CPAP.     Babs Bertin, DO Hospitalist

## 2022-01-14 NOTE — Plan of Care (Signed)

## 2022-01-14 NOTE — TOC Initial Note (Signed)
Transition of Care [facility]) - Initial/Assessment Note    Patient Details  Name: Jacob Silva MRN: [id number] Date of Birth: 18-Dec-1931  Transition of Care [facility]) CM/SW Contact:    Bethena Roys, RN Phone Number: 01/14/2022, 4:29 PM  Clinical Narrative:  Case Manager received a consult for [place]. Patient is from home with spouse. Case Manager spoke with spouse and states that the patient has DME cane, rolling walker, rollator, wheelchair, a power chair and oxygen 2 liters via [employer]. Spouse states she was driving the patient to appointments and he can take his medications without any issues. Patient is a member of the [place]. Case Manager discussed home services with the spouse and she is agreeable to [facility] RN for CHF Management with [facility]. Referral submitted to [facility]. Wife has questions regarding respite care in the home. Case Manager relayed this information to [facility] and she will contact the CSW from the [place] to see if they can offer Respite Care.  Patient will need [place] Orders for RN and Face to Face. Case Manager will continue to follow for additional needs.           Expected Discharge Plan: [place] Barriers to Discharge: Continued Medical Work up   Patient Goals and CMS Choice Patient states their goals for this hospitalization and ongoing recovery are:: to return home   Choice offered to / list presented to : Patient, Adult Children (discussed via the telephone)  Expected Discharge Plan and Services Expected Discharge Plan: [place] In-house Referral: NA Discharge Planning Services: CM Consult Post Acute Care Choice: Home Health Living arrangements for the past 2 months: Single Family Home                 DME Arranged: N/A DME Agency: NA       HH Arranged: RN, Disease Management [place] Agency: [place] Date HH Agency Contacted: 01/14/22 Time HH Agency Contacted:  [id number] Representative spoke with at [place]: Malachy Mood  Prior Living Arrangements/Services Living arrangements for the past 2 months: [place] Lives with:: Spouse Patient language and need for interpreter reviewed:: Yes Do you feel safe going back to the place where you live?: Yes      Need for Family Participation in Patient Care: Yes (Comment) Care giver support system in place?: Yes (comment) Current home services: DME (Patient has cane, rolling walker, rollator, wheelchair and power chair, Oxygen via [employer] 2 liters.) Criminal Activity/Legal Involvement Pertinent to Current Situation/Hospitalization: No - Comment as needed   Permission Sought/Granted Permission sought to share information with : Family Supports, Customer service manager, Case Optician, dispensing granted to share information with : Yes, Verbal Permission Granted     Permission granted to share info w AGENCY: [facility]        Emotional Assessment Appearance:: Appears stated age       Alcohol / Substance Use: Not Applicable Psych Involvement: No (comment)  Admission diagnosis:  Respiratory distress [R06.03] NSTEMI (non-ST elevated myocardial infarction) ([place]) [I21.4] Acute on chronic systolic congestive heart failure (HCC) [I50.23] Acute on chronic respiratory failure with hypoxia ([place]) [J96.21] Patient Active Problem List   Diagnosis Date Noted   Chronic obstructive pulmonary disease, unspecified ([place]) 01/13/2022   Acute on chronic respiratory failure with hypoxia ([place]) 01/13/2022   Acute renal failure superimposed on stage 3b chronic kidney disease ([place]) 01/13/2022   CHF exacerbation ([place]) 01/13/2022   NSTEMI (non-ST elevated myocardial infarction) (  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

## 2022-01-14 NOTE — Progress Notes (Signed)
Heart Failure Navigator Progress Note  Following this hospitalization to assess for HV TOC readiness.   Echo pending?  CKD 3/4  Earnestine Leys, BSN, RN Heart Failure Transport planner Only

## 2022-01-14 NOTE — Progress Notes (Addendum)
Progress Note  Patient Name: Jacob Silva Date of Encounter: 01/14/2022  [facility] Cardiologist: None Dr. Humphrey Rolls, at [place] same.  No chest pain.  Inpatient Medications    Scheduled Meds:  allopurinol  100 mg Oral Daily   aspirin  325 mg Oral Daily   atorvastatin  80 mg Oral Daily   carvedilol  12.5 mg Oral BID WC   clopidogrel  75 mg Oral Daily   dapagliflozin propanediol  10 mg Oral Daily   furosemide  40 mg Intravenous BID   gabapentin  400 mg Oral BID   insulin aspart  0-9 Units Subcutaneous TID WC   insulin glargine-yfgn  20 Units Subcutaneous QHS   isosorbide mononitrate  30 mg Oral Daily   levothyroxine  100 mcg Oral q morning   pantoprazole  40 mg Oral Daily   sodium chloride flush  3 mL Intravenous Q12H   umeclidinium-vilanterol  1 puff Inhalation Daily   Continuous Infusions:  heparin 1,300 Units/hr (01/13/22 1950)   PRN Meds: acetaminophen **OR** acetaminophen, albuterol, polyethylene glycol   Vital Signs    Vitals:   01/13/22 2245 01/14/22 0200 01/14/22 0322 01/14/22 0647  BP:   133/84   Pulse: 77 80 77   Resp: 18 20 18    Temp:   97.8 F (36.6 C)   TempSrc:   Oral   SpO2: 97% 94% 97%   Weight:    102.9 kg  Height:        Intake/Output Summary (Last 24 hours) at 01/14/2022 0859 Last data filed at 01/14/2022 0651 Gross per 24 hour  Intake 666.72 ml  Output 600 ml  Net 66.72 ml      01/14/2022    6:47 AM 01/13/2022    9:26 PM 01/13/2022    7:18 PM  Last 3 Weights  Weight (lbs) 226 lb 13.7 oz 225 lb 9.6 oz 215 lb  Weight (kg) 102.9 kg 102.331 kg 97.523 kg      Telemetry    Sinus rhythm- Personally Reviewed  ECG    Sinus rhythm, T wave inversion in lead I to V4- Personally Reviewed  Physical Exam   GEN: No acute distress.   Neck: No JVD Cardiac: RRR, no murmurs, rubs, or gallops.  Respiratory: Diminished breath sound GI: Soft, nontender, non-distended  MS: 1+ BL LE edema; No deformity. Neuro:   Nonfocal  Psych: Normal affect   Labs    High Sensitivity Troponin:   Recent Labs  Lab 01/13/22 1552 01/13/22 1747 01/13/22 2056 01/13/22 2219  TROPONINIHS 119* 318* 577* 641*     Chemistry Recent Labs  Lab 01/13/22 1552 01/13/22 1716 01/13/22 2056 01/14/22 0317  NA 140 145  --  142  K 4.7 3.9  --  4.8  CL 108  --   --  105  CO2 18*  --   --  25  GLUCOSE 139*  --   --  201*  BUN 36*  --   --  39*  CREATININE 2.90*  --   --  2.93*  CALCIUM 8.4*  --   --  8.9  MG  --   --  1.8  --   PROT 5.7*  --   --  6.3*  ALBUMIN 3.1*  --   --  3.2*  AST 51*  --   --  31  ALT 38  --   --  35  ALKPHOS 114  --   --  127*  BILITOT 0.7  --   --  0.3  GFRNONAA 20*  --   --  20*  ANIONGAP 14  --   --  12     Hematology Recent Labs  Lab 01/13/22 1552 01/13/22 1716 01/14/22 0317  WBC 11.2*  --  9.5  RBC 4.72  --  5.05  HGB 12.5* 11.6* 13.3  HCT 38.5* 34.0* 39.5  MCV 81.6  --  78.2*  MCH 26.5  --  26.3  MCHC 32.5  --  33.7  RDW 16.7*  --  16.6*  PLT 163  --  164    BNP Recent Labs  Lab 01/13/22 1552  BNP 529.5*     Radiology    DG Chest Port 1 View  Result Date: 01/13/2022 CLINICAL DATA:  Shortness of breath. EXAM: PORTABLE CHEST 1 VIEW COMPARISON:  02/16/2013 radiographs FINDINGS: Cardiomegaly and CABG changes noted. Pulmonary vascular congestion noted with interstitial opacities suggestive of interstitial edema. Small RIGHT pleural effusion/pleural thickening again noted. There is no evidence of pneumothorax or acute bony abnormality. IMPRESSION: Cardiomegaly with pulmonary vascular congestion and probable interstitial edema. Electronically Signed   By: Margarette Canada M.D.   On: 01/13/2022 16:26    Cardiac Studies   Pending echo   Patient Profile     88 y.o. male with history of CAD s/p CABG (LIMA-distal LAD, SVG-PDA and OM1), chronic kidney disease stage III/IV, diabetes mellitus, hypertension, hyperlipidemia, COPD/pulmonary fibrosis on 2 L oxygen, sleep apnea on  CPAP and iron deficiency anemia presented for evaluation of worsening shortness of breath.  Initially required CPAP.  Assessment & Plan    1.  Non-STEMI -Reported shortness of breath with and without activity.  No chest pain. -Troponin trending up 119>318>577>641.  Will recheck this morning.  EKG with T wave inversion in lead V1 to V4.  Unknown baseline.  This could be due to volume exacerbation.  -Continue IV heparin. -No plan for cardiac catheterization given elevated renal function.  Per wife, previously cardiac catheterization was deferred due to renal function.  Had a recent echocardiogram 11/2021 and he was told to have weak heart.  Will obtain this record. -Continue aspirin 81 mg daily and Plavix 75 mg daily -Reduce carvedilol to home dose at 6.25 mg twice daily -Continue Imdur -Continue heparin  2.  Acute heart failure, presumed acute on chronic systolic heart failure -Pending echocardiogram this admission as well as result from his heart doctor -BNP 529 -Chest x-ray with pulmonary vascular congestion and possible interstitial edema -For the past 2 months patient is dealing with volume exacerbation.  His Lasix was changed to torsemide.  Baseline weight 210 pounds.  He was doing well on torsemide but had recurrent volume exacerbation along with worsening renal function. -Evidence of volume overload by exam and labs -He is currently on Lasix IV 40 mg twice daily, net INO +66 cc.  Questionable accuracy.  Recommended nephrology for diuretic management in view of worsening renal function. -Home benazepril on hold -Continue Imdur and Farxiga -Continue carvedilol  3.  Acute on chronic kidney disease stage III/IV -Wife has lab work from [place] -Gradual worsening renal function for past 6 months -Recommended nephrology evaluation this admission with diuretic management  4.  Hyperlipidemia -LDL was 27 last month -Continue high intensity statin  5.  COPD 6. DM -Management per primary  team  For questions or updates, please contact [place] Please consult [email or web address] for contact info under        Signed, [employer],  PA  01/14/2022, 8:59 AM     I have personally seen and examined this patient. I agree with the assessment and plan as outlined above.  He is admitted with dyspnea, volume overload and found to have mild elevation in troponin. It is not clear that his troponin elevation is related to ACS. This may be demand ischemia. Given CKD and advanced age, he is a poor candidate for invasive cardiac evaluation with cardiac cath. Agree with Nephrology consultation for assistance in diuretic management . We will follow along but doubt cardiac cath is going to be a good option.   Lauree Chandler 01/14/2022 10:42 AM

## 2022-01-14 NOTE — Progress Notes (Signed)
MD notified of low bp 96/50 map 61 and low heart rate 49-50s.  Orders given to hold scheduled Coreg & Lasix.

## 2022-01-14 NOTE — Progress Notes (Signed)
PROGRESS NOTE    MANN SKAGGS  YWV:[id number] DOB: Dec 23, 1931 DOA: 01/13/2022 PCP: [name], MD   Brief Narrative:  Jacob Silva is a 88 y.o. male with medical history significant of CAD status post CABG, CKD 3, diabetes, gout, hyperlipidemia, hypertension, hypothyroidism, anemia, coagulopathy, COPD on 2L [place] at home - presenting with sudden onset shortness of breath.  Assessment & Plan:   Principal Problem:   Acute on chronic respiratory failure with hypoxia (HCC) Active Problems:   CAD (coronary artery disease)   Diabetes mellitus type II, controlled ([place])   Gout   HTN (hypertension)   Hypercholesterolemia   Hypothyroidism   Chronic obstructive pulmonary disease, unspecified (HCC)   Acute renal failure superimposed on stage 3b chronic kidney disease (HCC)   CHF exacerbation (HCC)   NSTEMI (non-ST elevated myocardial infarction) ([place])  Acute on chronic respiratory failure with hypoxia Acute heart failure exacerbation, unknown EF  Concurrent NSTEMI in the setting of above CAD, status post CABG Hyperlipidemia - Unclear EF, repeat echo here for further evaluation and treatment delineation - Cardiology following, appreciate insight recommendations, minimally elevated troponin likely secondary to supply/demand mismatch in the setting of hypoxia and heart failure exacerbation - Chest x-ray with cardiomegaly vascular congestion and probable interstitial edema. -Continue diuretics with Lasix, follow I's and O's - Continue home aspirin, Plavix, carvedilol, Imdur, statin -holding ACE inhibitor -may benefit from [facility] pending EF evaluation as above - Hold off on ivabradine until confirmed by wife   Questionable AKI versus advancing renal disease with history of CKD 3 per report -Follow urine output and creatinine, previous baseline appears to be around 2 (although limited history) -Continue to follow closely the setting of diuretics   COPD, not in acute  exacerbation - Continue home Anoro Ellipta - Continue as needed albuterol  Diabetes, insulin-dependent No results found for: "HGBA1C" > 35 units nightly at home -Continue sliding scale insulin, 20u at bedtime, hypoglycemic protocol while here  Gout - Continue home allopurinol  Hypertension Continue home medications   Anemia Leukocytosis > Hemoglobin appears stable at 12.5 > Mild leukocytosis 11.2, no infectious etiology elucidated thus far. - Continue to trend CBC - Monitor fever curve  Hypothyroidism - Continue home Synthroid   DVT prophylaxis:  Heparin  Code Status: DNR Family Communication: Wife at bedside  Status is: Inpatient  Dispo: The patient is from: Home              Anticipated d/c is to: Home              Anticipated d/c date is: 48 to 72 hours              Patient currently not medically stable for discharge  Consultants:  Cardiology  Procedures:  None  Antimicrobials:  None currently indicated  Subjective: No acute issues or events overnight denies nausea vomiting diarrhea constipation headache fevers chills or chest pain.  Edema shortness of breath and orthopnea ongoing but markedly improving  Objective: Vitals:   01/13/22 2245 01/14/22 0200 01/14/22 0322 01/14/22 0647  BP:   133/84   Pulse: 77 80 77   Resp: 18 20 18    Temp:   97.8 F (36.6 C)   TempSrc:   Oral   SpO2: 97% 94% 97%   Weight:    102.9 kg  Height:        Intake/Output Summary (Last 24 hours) at 01/14/2022 0808 Last data filed at 01/14/2022 0651 Gross per 24 hour  Intake 666.72  ml  Output 600 ml  Net 66.72 ml   Filed Weights   01/13/22 1918 01/13/22 2126 01/14/22 0647  Weight: 97.5 kg 102.3 kg 102.9 kg    Examination:  General:  Pleasantly resting in bed, No acute distress. HEENT:  Normocephalic atraumatic.  Sclerae nonicteric, noninjected.  Extraocular movements intact bilaterally. Neck:  Without mass or deformity.  Trachea is midline. Lungs: Bilateral rales  without wheezes or rhonchi. Heart:  Regular rate and rhythm.  Without murmurs, rubs, or gallops. Abdomen:  Soft, nontender, nondistended.  Without guarding or rebound. Extremities: 3+ pitting edema bilateral lower extremities to the knee   Data Reviewed: I have personally reviewed following labs and imaging studies  CBC: Recent Labs  Lab 01/13/22 1552 01/13/22 1716 01/14/22 0317  WBC 11.2*  --  9.5  HGB 12.5* 11.6* 13.3  HCT 38.5* 34.0* 39.5  MCV 81.6  --  78.2*  PLT 163  --  295   Basic Metabolic Panel: Recent Labs  Lab 01/13/22 1552 01/13/22 1716 01/13/22 2056 01/14/22 0317  NA 140 145  --  142  K 4.7 3.9  --  4.8  CL 108  --   --  105  CO2 18*  --   --  25  GLUCOSE 139*  --   --  201*  BUN 36*  --   --  39*  CREATININE 2.90*  --   --  2.93*  CALCIUM 8.4*  --   --  8.9  MG  --   --  1.8  --    GFR: Estimated Creatinine Clearance: 21.3 mL/min (A) (by C-G formula based on SCr of 2.93 mg/dL (H)). Liver Function Tests: Recent Labs  Lab 01/13/22 1552 01/14/22 0317  AST 51* 31  ALT 38 35  ALKPHOS 114 127*  BILITOT 0.7 0.3  PROT 5.7* 6.3*  ALBUMIN 3.1* 3.2*   No results for input(s): "LIPASE", "AMYLASE" in the last 168 hours. No results for input(s): "AMMONIA" in the last 168 hours. Coagulation Profile: Recent Labs  Lab 01/13/22 1552  INR 1.3*   Cardiac Enzymes: No results for input(s): "CKTOTAL", "CKMB", "CKMBINDEX", "TROPONINI" in the last 168 hours. BNP (last 3 results) No results for input(s): "PROBNP" in the last 8760 hours. HbA1C: No results for input(s): "HGBA1C" in the last 72 hours. CBG: Recent Labs  Lab 01/13/22 2138  GLUCAP 129*   Lipid Profile: No results for input(s): "CHOL", "HDL", "LDLCALC", "TRIG", "CHOLHDL", "LDLDIRECT" in the last 72 hours. Thyroid Function Tests: No results for input(s): "TSH", "T4TOTAL", "FREET4", "T3FREE", "THYROIDAB" in the last 72 hours. Anemia Panel: No results for input(s): "VITAMINB12", "FOLATE",  "FERRITIN", "TIBC", "IRON", "RETICCTPCT" in the last 72 hours. Sepsis Labs: No results for input(s): "PROCALCITON", "LATICACIDVEN" in the last 168 hours.  Recent Results (from the past 240 hour(s))  SARS Coronavirus 2 by RT PCR (hospital order, performed in [facility] hospital lab) *cepheid single result test* Anterior Nasal Swab     Status: None   Collection Time: 01/13/22  3:41 PM   Specimen: Anterior Nasal Swab  Result Value Ref Range Status   SARS Coronavirus 2 by RT PCR NEGATIVE NEGATIVE Final    Comment: (NOTE) SARS-CoV-2 target nucleic acids are NOT DETECTED.  The SARS-CoV-2 RNA is generally detectable in upper and lower respiratory specimens during the acute phase of infection. The lowest concentration of SARS-CoV-2 viral copies this assay can detect is 250 copies / mL. A negative result does not preclude SARS-CoV-2 infection and should not be  used as the sole basis for treatment or other patient management decisions.  A negative result may occur with improper specimen collection / handling, submission of specimen other than nasopharyngeal swab, presence of viral mutation(s) within the areas targeted by this assay, and inadequate number of viral copies (<250 copies / mL). A negative result must be combined with clinical observations, patient history, and epidemiological information.  Fact Sheet for Patients:   https://www.patel.info/  Fact Sheet for Healthcare Providers: https://hall.com/  This test is not yet approved or  cleared by the [place] FDA and has been authorized for detection and/or diagnosis of SARS-CoV-2 by FDA under an Emergency Use Authorization (EUA).  This EUA will remain in effect (meaning this test can be used) for the duration of the COVID-19 declaration under Section 564(b)(1) of the Act, 21 U.S.C. section 360bbb-3(b)(1), unless the authorization is terminated or revoked sooner.  Performed at [facility], [place] [address] [place], [place] [address]     Radiology Studies: DG Chest Port 1 View  Result Date: 01/13/2022 CLINICAL DATA:  Shortness of breath. EXAM: PORTABLE CHEST 1 VIEW COMPARISON:  02/16/2013 radiographs FINDINGS: Cardiomegaly and CABG changes noted. Pulmonary vascular congestion noted with interstitial opacities suggestive of interstitial edema. Small RIGHT pleural effusion/pleural thickening again noted. There is no evidence of pneumothorax or acute bony abnormality. IMPRESSION: Cardiomegaly with pulmonary vascular congestion and probable interstitial edema. Electronically Signed   By: Margarette Canada M.D.   On: 01/13/2022 16:26    Scheduled Meds:  allopurinol  100 mg Oral Daily   aspirin  325 mg Oral Daily   atorvastatin  80 mg Oral Daily   carvedilol  12.5 mg Oral BID WC   clopidogrel  75 mg Oral Daily   dapagliflozin propanediol  10 mg Oral Daily   furosemide  40 mg Intravenous BID   gabapentin  400 mg Oral BID   insulin aspart  0-9 Units Subcutaneous TID WC   insulin glargine-yfgn  20 Units Subcutaneous QHS   isosorbide mononitrate  30 mg Oral Daily   levothyroxine  100 mcg Oral q morning   pantoprazole  40 mg Oral Daily   sodium chloride flush  3 mL Intravenous Q12H   umeclidinium-vilanterol  1 puff Inhalation Daily   Continuous Infusions:  heparin 1,300 Units/hr (01/13/22 1950)    LOS: 1 day   Time spent: 66min  Jazaria Jarecki C Darrel Baroni, DO [facility]  If 7PM-7AM, please contact night-coverage [email or web address]  01/14/2022, 8:08 AM

## 2022-01-14 NOTE — Progress Notes (Signed)
  Echocardiogram 2D Echocardiogram has been performed.  Jacob Silva 01/14/2022, 3:36 PM

## 2022-01-15 DIAGNOSIS — J9621 Acute and chronic respiratory failure with hypoxia: Secondary | ICD-10-CM | POA: Diagnosis not present

## 2022-01-15 DIAGNOSIS — I5023 Acute on chronic systolic (congestive) heart failure: Secondary | ICD-10-CM | POA: Diagnosis not present

## 2022-01-15 DIAGNOSIS — I214 Non-ST elevation (NSTEMI) myocardial infarction: Secondary | ICD-10-CM | POA: Diagnosis not present

## 2022-01-15 LAB — CBC
HCT: 36.9 % — ABNORMAL LOW (ref 39.0–52.0)
Hemoglobin: 12.2 g/dL — ABNORMAL LOW (ref 13.0–17.0)
MCH: 26.4 pg (ref 26.0–34.0)
MCHC: 33.1 g/dL (ref 30.0–36.0)
MCV: 79.9 fL — ABNORMAL LOW (ref 80.0–100.0)
Platelets: 161 10*3/uL (ref 150–400)
RBC: 4.62 MIL/uL (ref 4.22–5.81)
RDW: 16.8 % — ABNORMAL HIGH (ref 11.5–15.5)
WBC: 12.4 10*3/uL — ABNORMAL HIGH (ref 4.0–10.5)
nRBC: 0 % (ref 0.0–0.2)

## 2022-01-15 LAB — BASIC METABOLIC PANEL
Anion gap: 8 (ref 5–15)
BUN: 44 mg/dL — ABNORMAL HIGH (ref 8–23)
CO2: 28 mmol/L (ref 22–32)
Calcium: 8.7 mg/dL — ABNORMAL LOW (ref 8.9–10.3)
Chloride: 106 mmol/L (ref 98–111)
Creatinine, Ser: 2.88 mg/dL — ABNORMAL HIGH (ref 0.61–1.24)
GFR, Estimated: 20 mL/min — ABNORMAL LOW (ref >60)
Glucose, Bld: 116 mg/dL — ABNORMAL HIGH (ref 70–99)
Potassium: 4.5 mmol/L (ref 3.5–5.1)
Sodium: 142 mmol/L (ref 135–145)

## 2022-01-15 LAB — GLUCOSE, CAPILLARY
Glucose-Capillary: 131 mg/dL — ABNORMAL HIGH (ref 70–99)
Glucose-Capillary: 139 mg/dL — ABNORMAL HIGH (ref 70–99)
Glucose-Capillary: 103 mg/dL — ABNORMAL HIGH (ref 70–99)
Glucose-Capillary: 189 mg/dL — ABNORMAL HIGH (ref 70–99)

## 2022-01-15 LAB — HEMOGLOBIN A1C
Hgb A1c MFr Bld: 7.4 % — ABNORMAL HIGH (ref 4.8–5.6)
Mean Plasma Glucose: 165.68 mg/dL

## 2022-01-15 LAB — HEPARIN LEVEL (UNFRACTIONATED): Heparin Unfractionated: 0.46 IU/mL (ref 0.30–0.70)

## 2022-01-15 MED ORDER — ARFORMOTEROL TARTRATE 15 MCG/2ML IN NEBU
15.0000 ug | INHALATION_SOLUTION | Freq: Two times a day (BID) | RESPIRATORY_TRACT | Status: DC
  Administered 2022-01-15 – 2022-01-18 (×6): 15 ug via RESPIRATORY_TRACT
  Filled 2022-01-15 (×8): qty 2

## 2022-01-15 MED ORDER — ADULT MULTIVITAMIN W/MINERALS CH
1.0000 | ORAL_TABLET | Freq: Every day | ORAL | Status: DC
  Administered 2022-01-15 – 2022-01-19 (×5): 1 via ORAL
  Filled 2022-01-15 (×5): qty 1

## 2022-01-15 MED ORDER — GLUCERNA SHAKE PO LIQD
237.0000 mL | Freq: Three times a day (TID) | ORAL | Status: DC
  Administered 2022-01-15 – 2022-01-18 (×11): 237 mL via ORAL

## 2022-01-15 NOTE — Progress Notes (Addendum)
ANTICOAGULATION CONSULT NOTE -  Pharmacy Consult for Heparin Indication: chest pain/ACS   Allergies  Allergen Reactions   Penicillin G Other (See Comments)   Penicillins Swelling   Tramadol     Other reaction(s): Dizziness    Patient Measurements: Height: 5\' 11"  (180.3 cm) Weight: 101.8 kg (224 lb 8 oz) IBW/kg (Calculated) : 75.3 Heparin Dosing Weight: 97.2 kg  Vital Signs: Temp: 97.8 F (36.6 C) (06/20 0515) Temp Source: Oral (06/20 0515) BP: 93/57 (06/20 0816) Pulse Rate: 66 (06/20 0816)  Labs: Recent Labs    01/13/22 1552 01/13/22 1716 01/13/22 1747 01/13/22 2219 01/14/22 0317 01/14/22 0903 01/14/22 0925 01/14/22 1059 01/15/22 0304  HGB 12.5* 11.6*  --   --  13.3  --   --   --  12.2*  HCT 38.5* 34.0*  --   --  39.5  --   --   --  36.9*  PLT 163  --   --   --  164  --   --   --  161  LABPROT 16.0*  --   --   --   --   --   --   --   --   INR 1.3*  --   --   --   --   --   --   --   --   HEPARINUNFRC  --   --   --   --  0.46 0.52  --   --  0.46  CREATININE 2.90*  --   --   --  2.93*  --   --   --  2.88*  TROPONINIHS 119*  --    < > 641*  --   --  354* 458*  --    < > = values in this interval not displayed.     Estimated Creatinine Clearance: 21.5 mL/min (A) (by C-G formula based on SCr of 2.88 mg/dL (H)).   Assessment: 88 y.o. male admitted with ACS r/o and started on IV heparin. Heparin level remains therapeutic, CBC stable. Discussed with cardiology, will stop heparin after 48h.  Goal of Therapy:  Heparin level 0.3-0.7 units/ml Monitor platelets by anticoagulation protocol: Yes   Plan:  Continue heparin 1300 units/h - stop 6/20 2000 Daily heparin level and CBC  Arrie Senate, PharmD, [facility] Clinical Pharmacist [phone] Please check AMION for all [facility] Pharmacy numbers 01/15/2022

## 2022-01-15 NOTE — Progress Notes (Signed)
Heart Failure Navigator Progress Note  Assessed for Heart & Vascular TOC clinic readiness.  Patient does not meet criteria due to Advanced CKD.     Earnestine Leys, BSN, Clinical cytogeneticist Only

## 2022-01-15 NOTE — Progress Notes (Addendum)
Progress Note  Patient Name: Jacob Silva Date of Encounter: 01/15/2022  [facility] Cardiologist: None   Subjective   Sitting up in bed receiving breathing treatment. Wife at bedside.   Inpatient Medications    Scheduled Meds:  allopurinol  100 mg Oral Daily   aspirin EC  81 mg Oral Daily   atorvastatin  80 mg Oral Daily   clopidogrel  75 mg Oral Daily   dapagliflozin propanediol  10 mg Oral Daily   furosemide  40 mg Intravenous BID   gabapentin  400 mg Oral BID   insulin aspart  0-9 Units Subcutaneous TID WC   insulin glargine-yfgn  20 Units Subcutaneous QHS   isosorbide mononitrate  30 mg Oral Daily   levothyroxine  100 mcg Oral q morning   pantoprazole  40 mg Oral Daily   sodium chloride flush  3 mL Intravenous Q12H   umeclidinium-vilanterol  1 puff Inhalation Daily   Continuous Infusions:  heparin 1,300 Units/hr (01/15/22 0900)   PRN Meds: acetaminophen **OR** acetaminophen, albuterol, polyethylene glycol   Vital Signs    Vitals:   01/14/22 2152 01/15/22 0515 01/15/22 0816 01/15/22 0915  BP:  116/66 (!) 93/57   Pulse: 63 60 66   Resp: (!) 25 18 18    Temp:  97.8 F (36.6 C)    TempSrc:  Oral    SpO2: 100% 94% 98% 98%  Weight:  101.8 kg    Height:        Intake/Output Summary (Last 24 hours) at 01/15/2022 0924 Last data filed at 01/15/2022 0650 Gross per 24 hour  Intake 150 ml  Output 850 ml  Net -700 ml      01/15/2022    5:15 AM 01/14/2022    6:47 AM 01/13/2022    9:26 PM  Last 3 Weights  Weight (lbs) 224 lb 8 oz 226 lb 13.7 oz 225 lb 9.6 oz  Weight (kg) 101.833 kg 102.9 kg 102.331 kg      Telemetry    Sinus Rhythm 60s - Personally Reviewed  ECG    No new tracing  Physical Exam   GEN: No acute distress. HOH, wearing 3L [place]: No JVD Cardiac: RRR, no murmurs, rubs, or gallops.  Respiratory: Diminished in bases GI: Soft, nontender, non-distended  MS: 1-2+ bilateral LE edema; No deformity. Neuro:  Nonfocal  Psych: Normal  affect   Labs    High Sensitivity Troponin:   Recent Labs  Lab 01/13/22 1747 01/13/22 2056 01/13/22 2219 01/14/22 0925 01/14/22 1059  TROPONINIHS 318* 577* 641* 354* 458*     Chemistry Recent Labs  Lab 01/13/22 1552 01/13/22 1716 01/13/22 2056 01/14/22 0317 01/15/22 0304  NA 140 145  --  142 142  K 4.7 3.9  --  4.8 4.5  CL 108  --   --  105 106  CO2 18*  --   --  25 28  GLUCOSE 139*  --   --  201* 116*  BUN 36*  --   --  39* 44*  CREATININE 2.90*  --   --  2.93* 2.88*  CALCIUM 8.4*  --   --  8.9 8.7*  MG  --   --  1.8  --   --   PROT 5.7*  --   --  6.3*  --   ALBUMIN 3.1*  --   --  3.2*  --   AST 51*  --   --  31  --   ALT  38  --   --  35  --   ALKPHOS 114  --   --  127*  --   BILITOT 0.7  --   --  0.3  --   GFRNONAA 20*  --   --  20* 20*  ANIONGAP 14  --   --  12 8    Lipids No results for input(s): "CHOL", "TRIG", "HDL", "LABVLDL", "LDLCALC", "CHOLHDL" in the last 168 hours.  Hematology Recent Labs  Lab 01/13/22 1552 01/13/22 1716 01/14/22 0317 01/15/22 0304  WBC 11.2*  --  9.5 12.4*  RBC 4.72  --  5.05 4.62  HGB 12.5* 11.6* 13.3 12.2*  HCT 38.5* 34.0* 39.5 36.9*  MCV 81.6  --  78.2* 79.9*  MCH 26.5  --  26.3 26.4  MCHC 32.5  --  33.7 33.1  RDW 16.7*  --  16.6* 16.8*  PLT 163  --  164 161   Thyroid No results for input(s): "TSH", "FREET4" in the last 168 hours.  BNP Recent Labs  Lab 01/13/22 1552  BNP 529.5*    DDimer No results for input(s): "DDIMER" in the last 168 hours.   Radiology    ECHOCARDIOGRAM COMPLETE  Result Date: 01/14/2022    ECHOCARDIOGRAM REPORT   Patient Name:   Jacob Silva Date of Exam: 01/14/2022 Medical Rec #:  [id number]       Height:       71.0 in Accession #:    [id number]      Weight:       226.9 lb Date of Birth:  07/12/1932       BSA:          2.225 m Patient Age:    88 years        BP:           145/60 mmHg Patient Gender: M               HR:           56 bpm. Exam Location:  Inpatient Procedure: 2D Echo, Cardiac  Doppler and Color Doppler Indications:    Dyspnea  History:        Patient has no prior history of Echocardiogram examinations.                 Prior CABG, Signs/Symptoms:Shortness of Breath; Risk                 Factors:Hypertension, Dyslipidemia and Diabetes.  Sonographer:    Clayton Lefort RDCS (AE) Referring Phys: [id number] Candace [facility] [place]  Sonographer Comments: Technically difficult study due to poor echo windows. IMPRESSIONS  1. Left ventricular ejection fraction, by estimation, is 55 to 60%. The left ventricle has normal function. The left ventricle has no regional wall motion abnormalities. Left ventricular diastolic parameters are indeterminate. There is the interventricular septum is flattened in systole and diastole, consistent with right ventricular pressure and volume overload.  2. Right ventricular systolic function is severely reduced. The right ventricular size is severely enlarged. There is severely elevated pulmonary artery systolic pressure. The estimated right ventricular systolic pressure is 02.7 mmHg.  3. Right atrial size was severely dilated.  4. The mitral valve is normal in structure. Trivial mitral valve regurgitation. No evidence of mitral stenosis.  5. Tricuspid valve regurgitation is severe.  6. The aortic valve is tricuspid. There is mild calcification of the aortic valve. Aortic valve regurgitation is not visualized. Aortic valve sclerosis/calcification is present, without any evidence  of aortic stenosis. Aortic valve area, by VTI measures  2.61 cm. Aortic valve mean gradient measures 3.0 mmHg. Aortic valve Vmax measures 1.16 m/s.  7. The inferior vena cava is normal in size with greater than 50% respiratory variability, suggesting right atrial pressure of 3 mmHg. FINDINGS  Left Ventricle: Left ventricular ejection fraction, by estimation, is 55 to 60%. The left ventricle has normal function. The left ventricle has no regional wall motion abnormalities. The left ventricular internal  cavity size was normal in size. There is  no left ventricular hypertrophy. The interventricular septum is flattened in systole and diastole, consistent with right ventricular pressure and volume overload. Left ventricular diastolic parameters are indeterminate. Right Ventricle: The right ventricular size is severely enlarged. No increase in right ventricular wall thickness. Right ventricular systolic function is severely reduced. There is severely elevated pulmonary artery systolic pressure. The tricuspid regurgitant velocity is 3.68 m/s, and with an assumed right atrial pressure of 15 mmHg, the estimated right ventricular systolic pressure is 93.2 mmHg. Left Atrium: Left atrial size was normal in size. Right Atrium: Right atrial size was severely dilated. Pericardium: There is no evidence of pericardial effusion. Mitral Valve: The mitral valve is normal in structure. Trivial mitral valve regurgitation. No evidence of mitral valve stenosis. Tricuspid Valve: The tricuspid valve is normal in structure. Tricuspid valve regurgitation is severe. No evidence of tricuspid stenosis. Aortic Valve: The aortic valve is tricuspid. There is mild calcification of the aortic valve. Aortic valve regurgitation is not visualized. Aortic valve sclerosis/calcification is present, without any evidence of aortic stenosis. Aortic valve mean gradient measures 3.0 mmHg. Aortic valve peak gradient measures 5.4 mmHg. Aortic valve area, by VTI measures 2.61 cm. Pulmonic Valve: The pulmonic valve was normal in structure. Pulmonic valve regurgitation is mild to moderate. No evidence of pulmonic stenosis. Aorta: The aortic root is normal in size and structure. Venous: The inferior vena cava is normal in size with greater than 50% respiratory variability, suggesting right atrial pressure of 3 mmHg. IAS/Shunts: No atrial level shunt detected by color flow Doppler.  LEFT VENTRICLE PLAX 2D LVIDd:         4.40 cm      Diastology LVIDs:         2.90  cm      LV e' medial:    3.53 cm/s LV PW:         1.10 cm      LV E/e' medial:  16.3 LV IVS:        1.10 cm      LV e' lateral:   8.48 cm/s LVOT diam:     2.00 cm      LV E/e' lateral: 6.8 LV SV:         65 LV SV Index:   29 LVOT Area:     3.14 cm  LV Volumes (MOD) LV vol d, MOD A2C: 109.0 ml LV vol d, MOD A4C: 49.0 ml LV vol s, MOD A2C: 62.3 ml LV vol s, MOD A4C: 30.7 ml LV SV MOD A2C:     46.7 ml LV SV MOD A4C:     49.0 ml LV SV MOD BP:      31.3 ml RIGHT VENTRICLE            IVC RV Basal diam:  3.80 cm    IVC diam: 2.40 cm RV S prime:     5.93 cm/s TAPSE (M-mode): 0.9 cm LEFT ATRIUM  Index        RIGHT ATRIUM           Index LA diam:      3.80 cm 1.71 cm/m   RA Area:     27.20 cm LA Vol (A2C): 44.6 ml 20.05 ml/m  RA Volume:   103.00 ml 46.30 ml/m LA Vol (A4C): 40.0 ml 17.98 ml/m  AORTIC VALVE AV Area (Vmax):    2.49 cm AV Area (Vmean):   2.54 cm AV Area (VTI):     2.61 cm AV Vmax:           116.00 cm/s AV Vmean:          79.300 cm/s AV VTI:            0.249 m AV Peak Grad:      5.4 mmHg AV Mean Grad:      3.0 mmHg LVOT Vmax:         92.00 cm/s LVOT Vmean:        64.200 cm/s LVOT VTI:          0.207 m LVOT/AV VTI ratio: 0.83  AORTA Ao Root diam: 3.60 cm MITRAL VALVE               TRICUSPID VALVE MV Area (PHT): 1.70 cm    TR Peak grad:   54.2 mmHg MV Decel Time: 446 msec    TR Vmax:        368.00 cm/s MV E velocity: 57.70 cm/s MV A velocity: 58.80 cm/s  SHUNTS MV E/A ratio:  0.98        Systemic VTI:  0.21 m                            Systemic Diam: 2.00 cm Glori Bickers MD Electronically signed by Glori Bickers MD Signature Date/Time: 01/14/2022/4:23:26 PM    Final    DG Chest Port 1 View  Result Date: 01/13/2022 CLINICAL DATA:  Shortness of breath. EXAM: PORTABLE CHEST 1 VIEW COMPARISON:  02/16/2013 radiographs FINDINGS: Cardiomegaly and CABG changes noted. Pulmonary vascular congestion noted with interstitial opacities suggestive of interstitial edema. Small RIGHT pleural  effusion/pleural thickening again noted. There is no evidence of pneumothorax or acute bony abnormality. IMPRESSION: Cardiomegaly with pulmonary vascular congestion and probable interstitial edema. Electronically Signed   By: Margarette Canada M.D.   On: 01/13/2022 16:26    Cardiac Studies   Echo: 01/14/22  IMPRESSIONS     1. Left ventricular ejection fraction, by estimation, is 55 to 60%. The  left ventricle has normal function. The left ventricle has no regional  wall motion abnormalities. Left ventricular diastolic parameters are  indeterminate. There is the  interventricular septum is flattened in systole and diastole, consistent  with right ventricular pressure and volume overload.   2. Right ventricular systolic function is severely reduced. The right  ventricular size is severely enlarged. There is severely elevated  pulmonary artery systolic pressure. The estimated right ventricular  systolic pressure is 93.2 mmHg.   3. Right atrial size was severely dilated.   4. The mitral valve is normal in structure. Trivial mitral valve  regurgitation. No evidence of mitral stenosis.   5. Tricuspid valve regurgitation is severe.   6. The aortic valve is tricuspid. There is mild calcification of the  aortic valve. Aortic valve regurgitation is not visualized. Aortic valve  sclerosis/calcification is present, without any evidence of aortic  stenosis. Aortic valve area,  by VTI measures   2.61 cm. Aortic valve mean gradient measures 3.0 mmHg. Aortic valve Vmax  measures 1.16 m/s.   7. The inferior vena cava is normal in size with greater than 50%  respiratory variability, suggesting right atrial pressure of 3 mmHg.   FINDINGS   Left Ventricle: Left ventricular ejection fraction, by estimation, is 55  to 60%. The left ventricle has normal function. The left ventricle has no  regional wall motion abnormalities. The left ventricular internal cavity  size was normal in size. There is   no left  ventricular hypertrophy. The interventricular septum is flattened  in systole and diastole, consistent with right ventricular pressure and  volume overload. Left ventricular diastolic parameters are indeterminate.   Right Ventricle: The right ventricular size is severely enlarged. No  increase in right ventricular wall thickness. Right ventricular systolic  function is severely reduced. There is severely elevated pulmonary artery  systolic pressure. The tricuspid  regurgitant velocity is 3.68 m/s, and with an assumed right atrial  pressure of 15 mmHg, the estimated right ventricular systolic pressure is  58.5 mmHg.   Left Atrium: Left atrial size was normal in size.   Right Atrium: Right atrial size was severely dilated.   Pericardium: There is no evidence of pericardial effusion.   Mitral Valve: The mitral valve is normal in structure. Trivial mitral  valve regurgitation. No evidence of mitral valve stenosis.   Tricuspid Valve: The tricuspid valve is normal in structure. Tricuspid  valve regurgitation is severe. No evidence of tricuspid stenosis.   Aortic Valve: The aortic valve is tricuspid. There is mild calcification  of the aortic valve. Aortic valve regurgitation is not visualized. Aortic  valve sclerosis/calcification is present, without any evidence of aortic  stenosis. Aortic valve mean  gradient measures 3.0 mmHg. Aortic valve peak gradient measures 5.4 mmHg.  Aortic valve area, by VTI measures 2.61 cm.   Pulmonic Valve: The pulmonic valve was normal in structure. Pulmonic valve  regurgitation is mild to moderate. No evidence of pulmonic stenosis.   Aorta: The aortic root is normal in size and structure.   Venous: The inferior vena cava is normal in size with greater than 50%  respiratory variability, suggesting right atrial pressure of 3 mmHg.   IAS/Shunts: No atrial level shunt detected by color flow Doppler.   Patient Profile     88 y.o. 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  Initially required CPAP.  Assessment & Plan    Non-STEMI: Troponin trending up 119>318>577>641>>458. EKG with T wave inversion in lead V1 to V4.  Unknown baseline. No chest pain.   -- Poor candidate for cardiac catheterization given elevated renal function and advanced age.  Per wife, previously cardiac catheterization was deferred due to renal function.  -- Continue aspirin 81 mg daily and Plavix 75 mg daily, IV heparin (48 hrs total) -- Continue Imdur  HFpEF with severely reduced RV: BNP 529, Chest x-ray with pulmonary vascular congestion and possible interstitial edema. Past 2 months patient is dealing with volume exacerbation.  His Lasix was changed to torsemide.  Baseline weight 210 pounds.  He was doing well on torsemide but had recurrent volume exacerbation along with worsening renal function. -- Evidence of volume overload by exam and labs on admission.  Remains on Lasix IV 40 mg twice daily, net -633 cc.  Questionable accuracy? Still with LE edema noted.  -- echo showed LVEF of 55-60% with severely reduced RV function, severely dilated right atrium, RVSP 69 mmHg. This is new from previously echo report (wife has paper copy) from 10/2021. May be thromboembolic? Consider VQ scan -- Home benazepril on hold, coreg stopped yesterday with hypotension. Wife reports his BPs have been low frequently at home -- Continue Imdur and [place]   Acute on chronic kidney disease stage III/IV:Gradual worsening renal function for past 6 months -- holding ACEi   Hyperlipidemia: LDL was 27 last month -- Continue high intensity statin   Per primary COPD- chronically O2 dependent. Wife notes he has declined to see pulmonary in the  past DM Anemia Gout  For questions or updates, please contact [facility] Please consult [email or web address] for contact info under        Signed, Reino Bellis, NP  01/15/2022, 9:24 AM    I have personally seen and examined this patient. I agree with the assessment and plan as outlined above.  He has no chest pain this am. Mild elevation in troponin but he is not a good candidate for cardiac cath given his advanced age and advanced kidney disease. Continue ASA and Plavix.  He continues to have LE edema. Would continue IV Lasix for now.  RV dysfunction on echo. Consider chronic thromboembolic disease but this could be due to his COPD.   Lauree Chandler 01/15/2022 10:55 AM

## 2022-01-15 NOTE — Progress Notes (Signed)
TR[facility] night cross cover note:   I was notified by RN that the patient's most recent blood pressure, taken shortly after 1900 on 01/14/2022, was found to be 89/53, while noting that the patient's blood pressure had also been low earlier in the afternoon, with BP of 85/50 noted at 1700 today.  This evening's doses of Coreg and IV Lasix have been held.  Per my chart review, including review of most recent rounding hospitalist progress note, this is a 88 year old male who is admitted with acute on chronic hypoxic respiratory failure in the setting of acute CHF exacerbation, undergoing IV diuresis with Lasix.  At the time of the above blood pressure of 89/53, other VS appear stable, including afebrile, with heart rates in the 50s to 60s.  Additionally, his supplemental O2 requirement of 4 L nasal cannula appears stable, without any interval increase relative to earlier in the day.  In considering volume status and potential implications towards the patient's current blood pressures, I discussed I&O's from dayshift with patient's RN, who conveys total IN of 1,100 cc's over those 12 hours while noting unreliable outs given multiple unmeasured urine outputs.   Given stability of patient's blood pressures relative to values earlier this afternoon, along with no new symptoms, elected to continue to monitor patient's ensuing blood pressure, while refraining from IV fluids.   Ensuing blood pressure noted to trend up, with most recent blood pressure noted to be 109/55.   Babs Bertin, DO Hospitalist

## 2022-01-15 NOTE — Progress Notes (Signed)
PROGRESS NOTE    Jacob Silva  DJM:[id number] DOB: [birth year] DOA: 01/13/2022 PCP: [name], MD   Brief Narrative:  Jacob Silva is a 88 y.o. male with medical history significant of CAD status post CABG, CKD 3, diabetes, gout, hyperlipidemia, hypertension, hypothyroidism, anemia, coagulopathy, COPD on 2L  at home - presenting with sudden onset shortness of breath.  Assessment & Plan:   Principal Problem:   Acute on chronic respiratory failure with hypoxia (HCC) Active Problems:   CAD (coronary artery disease)   Diabetes mellitus type II, controlled ([place])   Gout   HTN (hypertension)   Hypercholesterolemia   Hypothyroidism   Chronic obstructive pulmonary disease, unspecified (HCC)   Acute renal failure superimposed on stage 3b chronic kidney disease (HCC)   CHF exacerbation (HCC)   NSTEMI (non-ST elevated myocardial infarction) (HCC)  Acute on chronic respiratory failure with hypoxia Acute heart failure exacerbation, diastolic with severely reduced right ventricular function -EF 55 to 60% Concurrent NSTEMI in the setting of above CAD, status post CABG Hyperlipidemia -Echo shows EF 55 to 60% with severely reduced RV function - Cardiology following, appreciate insight recommendations -Holding carvedilol today given hypotension to ensure room for ongoing diuresis - follow I's and O's --1 L since intake, urine output not as robust as expected given IV diuretics - Continue home aspirin, Plavix, carvedilol, Imdur, statin -holding ACE inhibitor -may benefit from [facility] pending EF evaluation as above - Hold ivabradine -unclear if this is an ongoing medication   Questionable AKI versus advancing renal disease with history of CKD 3 per report -Follow urine output and creatinine, previous baseline appears to be around 2 (although limited history) -Continue to follow closely the setting of diuretics   COPD, not in acute exacerbation - Continue home Anoro Ellipta -  Continue as needed albuterol  Diabetes, insulin-dependent, well controlled -A1c 7.4  - Home regimen > 35 units nightly at home - Continue sliding scale insulin, 20u at bedtime, hypoglycemic protocol while here  Gout - Continue home allopurinol  Hypertension Continue home medications   Leukemoid reaction -White count remains somewhat labile but just above normal limits today, likely reactive -No fevers or other complaints indicative of infectious process  Hypothyroidism - Continue home Synthroid   DVT prophylaxis:  Heparin  Code Status: DNR Family Communication: Wife at bedside  Status is: Inpatient  Dispo: The patient is from: Home              Anticipated d/c is to: Home              Anticipated d/c date is: 48 to 72 hours              Patient currently not medically stable for discharge  Consultants:  Cardiology  Procedures:  None  Antimicrobials:  None currently indicated  Subjective: No acute issues or events overnight denies nausea vomiting diarrhea constipation headache fever chills or chest pain.  Dyspnea orthopnea and lower extremity edema are drastically improving.  Objective: Vitals:   01/14/22 2044 01/14/22 2103 01/14/22 2152 01/15/22 0515  BP: (!) 109/55   116/66  Pulse: 87 (!) 57 63 60  Resp: 18 15 (!) 25 18  Temp: 97.8 F (36.6 C)   97.8 F (36.6 C)  TempSrc: Oral   Oral  SpO2: 98% 97% 100% 94%  Weight:    101.8 kg  Height:        Intake/Output Summary (Last 24 hours) at 01/15/2022 0759 Last data filed at 01/15/2022  2694 Gross per 24 hour  Intake 150 ml  Output 850 ml  Net -700 ml    Filed Weights   01/13/22 2126 01/14/22 0647 01/15/22 0515  Weight: 102.3 kg 102.9 kg 101.8 kg    Examination:  General:  Pleasantly resting in bed, No acute distress. HEENT:  Normocephalic atraumatic.  Sclerae nonicteric, noninjected.  Extraocular movements intact bilaterally. Neck:  Without mass or deformity.  Trachea is midline. Lungs: Bilateral  rales without wheezes or rhonchi. Heart:  Regular rate and rhythm.  Without murmurs, rubs, or gallops. Abdomen:  Soft, nontender, nondistended.  Without guarding or rebound. Extremities: 3+ pitting edema bilateral lower extremities to the knee   Data Reviewed: I have personally reviewed following labs and imaging studies  CBC: Recent Labs  Lab 01/13/22 1552 01/13/22 1716 01/14/22 0317 01/15/22 0304  WBC 11.2*  --  9.5 12.4*  HGB 12.5* 11.6* 13.3 12.2*  HCT 38.5* 34.0* 39.5 36.9*  MCV 81.6  --  78.2* 79.9*  PLT 163  --  164 854    Basic Metabolic Panel: Recent Labs  Lab 01/13/22 1552 01/13/22 1716 01/13/22 2056 01/14/22 0317 01/15/22 0304  NA 140 145  --  142 142  K 4.7 3.9  --  4.8 4.5  CL 108  --   --  105 106  CO2 18*  --   --  25 28  GLUCOSE 139*  --   --  201* 116*  BUN 36*  --   --  39* 44*  CREATININE 2.90*  --   --  2.93* 2.88*  CALCIUM 8.4*  --   --  8.9 8.7*  MG  --   --  1.8  --   --     GFR: Estimated Creatinine Clearance: 21.5 mL/min (A) (by C-G formula based on SCr of 2.88 mg/dL (H)). Liver Function Tests: Recent Labs  Lab 01/13/22 1552 01/14/22 0317  AST 51* 31  ALT 38 35  ALKPHOS 114 127*  BILITOT 0.7 0.3  PROT 5.7* 6.3*  ALBUMIN 3.1* 3.2*    No results for input(s): "LIPASE", "AMYLASE" in the last 168 hours. No results for input(s): "AMMONIA" in the last 168 hours. Coagulation Profile: Recent Labs  Lab 01/13/22 1552  INR 1.3*    Cardiac Enzymes: No results for input(s): "CKTOTAL", "CKMB", "CKMBINDEX", "TROPONINI" in the last 168 hours. BNP (last 3 results) No results for input(s): "PROBNP" in the last 8760 hours. HbA1C: Recent Labs    01/15/22 0304  HGBA1C 7.4*   CBG: Recent Labs  Lab 01/13/22 2138 01/14/22 1017 01/14/22 1224 01/14/22 1626 01/14/22 2117  GLUCAP 129* 140* 165* 169* 196*    Lipid Profile: No results for input(s): "CHOL", "HDL", "LDLCALC", "TRIG", "CHOLHDL", "LDLDIRECT" in the last 72 hours. Thyroid  Function Tests: No results for input(s): "TSH", "T4TOTAL", "FREET4", "T3FREE", "THYROIDAB" in the last 72 hours. Anemia Panel: No results for input(s): "VITAMINB12", "FOLATE", "FERRITIN", "TIBC", "IRON", "RETICCTPCT" in the last 72 hours. Sepsis Labs: No results for input(s): "PROCALCITON", "LATICACIDVEN" in the last 168 hours.  Recent Results (from the past 240 hour(s))  SARS Coronavirus 2 by RT PCR (hospital order, performed in [facility] hospital lab) *cepheid single result test* Anterior Nasal Swab     Status: None   Collection Time: 01/13/22  3:41 PM   Specimen: Anterior Nasal Swab  Result Value Ref Range Status   SARS Coronavirus 2 by RT PCR NEGATIVE NEGATIVE Final    Comment: (NOTE) SARS-CoV-2 target nucleic acids are NOT  DETECTED.  The SARS-CoV-2 RNA is generally detectable in upper and lower respiratory specimens during the acute phase of infection. The lowest concentration of SARS-CoV-2 viral copies this assay can detect is 250 copies / mL. A negative result does not preclude SARS-CoV-2 infection and should not be used as the sole basis for treatment or other patient management decisions.  A negative result may occur with improper specimen collection / handling, submission of specimen other than nasopharyngeal swab, presence of viral mutation(s) within the areas targeted by this assay, and inadequate number of viral copies (<250 copies / mL). A negative result must be combined with clinical observations, patient history, and epidemiological information.  Fact Sheet for Patients:   https://www.patel.info/  Fact Sheet for Healthcare Providers: https://hall.com/  This test is not yet approved or  cleared by the [place] FDA and has been authorized for detection and/or diagnosis of SARS-CoV-2 by FDA under an Emergency Use Authorization (EUA).  This EUA will remain in effect (meaning this test can be used) for the duration of  the COVID-19 declaration under Section 564(b)(1) of the Act, 21 U.S.C. section 360bbb-3(b)(1), unless the authorization is terminated or revoked sooner.  Performed at [facility], [place] [address] [place], [place] [address]     Radiology Studies: ECHOCARDIOGRAM COMPLETE  Result Date: 01/14/2022    ECHOCARDIOGRAM REPORT   Patient Name:   Jacob Silva Date of Exam: 01/14/2022 Medical Rec #:  [id number]       Height:       71.0 in Accession #:    [id number]      Weight:       226.9 lb Date of Birth:  Sep 25, 1931       BSA:          2.225 m Patient Age:    77 years        BP:           145/60 mmHg Patient Gender: M               HR:           56 bpm. Exam Location:  Inpatient Procedure: 2D Echo, Cardiac Doppler and Color Doppler Indications:    Dyspnea  History:        Patient has no prior history of Echocardiogram examinations.                 Prior CABG, Signs/Symptoms:Shortness of Breath; Risk                 Factors:Hypertension, Dyslipidemia and Diabetes.  Sonographer:    Clayton Lefort RDCS (AE) Referring Phys: [id number] Candace Gallus [facility]  Sonographer Comments: Technically difficult study due to poor echo windows. IMPRESSIONS  1. Left ventricular ejection fraction, by estimation, is 55 to 60%. The left ventricle has normal function. The left ventricle has no regional wall motion abnormalities. Left ventricular diastolic parameters are indeterminate. There is the interventricular septum is flattened in systole and diastole, consistent with right ventricular pressure and volume overload.  2. Right ventricular systolic function is severely reduced. The right ventricular size is severely enlarged. There is severely elevated pulmonary artery systolic pressure. The estimated right ventricular systolic pressure is 60.1 mmHg.  3. Right atrial size was severely dilated.  4. The mitral valve is normal in structure. Trivial mitral valve regurgitation. No evidence of mitral stenosis.  5. Tricuspid valve  regurgitation is severe.  6. The aortic valve is tricuspid. There is mild calcification of the  aortic valve. Aortic valve regurgitation is not visualized. Aortic valve sclerosis/calcification is present, without any evidence of aortic stenosis. Aortic valve area, by VTI measures  2.61 cm. Aortic valve mean gradient measures 3.0 mmHg. Aortic valve Vmax measures 1.16 m/s.  7. The inferior vena cava is normal in size with greater than 50% respiratory variability, suggesting right atrial pressure of 3 mmHg. FINDINGS  Left Ventricle: Left ventricular ejection fraction, by estimation, is 55 to 60%. The left ventricle has normal function. The left ventricle has no regional wall motion abnormalities. The left ventricular internal cavity size was normal in size. There is  no left ventricular hypertrophy. The interventricular septum is flattened in systole and diastole, consistent with right ventricular pressure and volume overload. Left ventricular diastolic parameters are indeterminate. Right Ventricle: The right ventricular size is severely enlarged. No increase in right ventricular wall thickness. Right ventricular systolic function is severely reduced. There is severely elevated pulmonary artery systolic pressure. The tricuspid regurgitant velocity is 3.68 m/s, and with an assumed right atrial pressure of 15 mmHg, the estimated right ventricular systolic pressure is 46.9 mmHg. Left Atrium: Left atrial size was normal in size. Right Atrium: Right atrial size was severely dilated. Pericardium: There is no evidence of pericardial effusion. Mitral Valve: The mitral valve is normal in structure. Trivial mitral valve regurgitation. No evidence of mitral valve stenosis. Tricuspid Valve: The tricuspid valve is normal in structure. Tricuspid valve regurgitation is severe. No evidence of tricuspid stenosis. Aortic Valve: The aortic valve is tricuspid. There is mild calcification of the aortic valve. Aortic valve regurgitation is  not visualized. Aortic valve sclerosis/calcification is present, without any evidence of aortic stenosis. Aortic valve mean gradient measures 3.0 mmHg. Aortic valve peak gradient measures 5.4 mmHg. Aortic valve area, by VTI measures 2.61 cm. Pulmonic Valve: The pulmonic valve was normal in structure. Pulmonic valve regurgitation is mild to moderate. No evidence of pulmonic stenosis. Aorta: The aortic root is normal in size and structure. Venous: The inferior vena cava is normal in size with greater than 50% respiratory variability, suggesting right atrial pressure of 3 mmHg. IAS/Shunts: No atrial level shunt detected by color flow Doppler.  LEFT VENTRICLE PLAX 2D LVIDd:         4.40 cm      Diastology LVIDs:         2.90 cm      LV e' medial:    3.53 cm/s LV PW:         1.10 cm      LV E/e' medial:  16.3 LV IVS:        1.10 cm      LV e' lateral:   8.48 cm/s LVOT diam:     2.00 cm      LV E/e' lateral: 6.8 LV SV:         65 LV SV Index:   29 LVOT Area:     3.14 cm  LV Volumes (MOD) LV vol d, MOD A2C: 109.0 ml LV vol d, MOD A4C: 49.0 ml LV vol s, MOD A2C: 62.3 ml LV vol s, MOD A4C: 30.7 ml LV SV MOD A2C:     46.7 ml LV SV MOD A4C:     49.0 ml LV SV MOD BP:      31.3 ml RIGHT VENTRICLE            IVC RV Basal diam:  3.80 cm    IVC diam: 2.40 cm RV S prime:  5.93 cm/s TAPSE (M-mode): 0.9 cm LEFT ATRIUM           Index        RIGHT ATRIUM           Index LA diam:      3.80 cm 1.71 cm/m   RA Area:     27.20 cm LA Vol (A2C): 44.6 ml 20.05 ml/m  RA Volume:   103.00 ml 46.30 ml/m LA Vol (A4C): 40.0 ml 17.98 ml/m  AORTIC VALVE AV Area (Vmax):    2.49 cm AV Area (Vmean):   2.54 cm AV Area (VTI):     2.61 cm AV Vmax:           116.00 cm/s AV Vmean:          79.300 cm/s AV VTI:            0.249 m AV Peak Grad:      5.4 mmHg AV Mean Grad:      3.0 mmHg LVOT Vmax:         92.00 cm/s LVOT Vmean:        64.200 cm/s LVOT VTI:          0.207 m LVOT/AV VTI ratio: 0.83  AORTA Ao Root diam: 3.60 cm MITRAL VALVE                TRICUSPID VALVE MV Area (PHT): 1.70 cm    TR Peak grad:   54.2 mmHg MV Decel Time: 446 msec    TR Vmax:        368.00 cm/s MV E velocity: 57.70 cm/s MV A velocity: 58.80 cm/s  SHUNTS MV E/A ratio:  0.98        Systemic VTI:  0.21 m                            Systemic Diam: 2.00 cm Glori Bickers MD Electronically signed by Glori Bickers MD Signature Date/Time: 01/14/2022/4:23:26 PM    Final    DG Chest Port 1 View  Result Date: 01/13/2022 CLINICAL DATA:  Shortness of breath. EXAM: PORTABLE CHEST 1 VIEW COMPARISON:  02/16/2013 radiographs FINDINGS: Cardiomegaly and CABG changes noted. Pulmonary vascular congestion noted with interstitial opacities suggestive of interstitial edema. Small RIGHT pleural effusion/pleural thickening again noted. There is no evidence of pneumothorax or acute bony abnormality. IMPRESSION: Cardiomegaly with pulmonary vascular congestion and probable interstitial edema. Electronically Signed   By: Margarette Canada M.D.   On: 01/13/2022 16:26    Scheduled Meds:  allopurinol  100 mg Oral Daily   aspirin EC  81 mg Oral Daily   atorvastatin  80 mg Oral Daily   carvedilol  6.25 mg Oral BID WC   clopidogrel  75 mg Oral Daily   dapagliflozin propanediol  10 mg Oral Daily   furosemide  40 mg Intravenous BID   gabapentin  400 mg Oral BID   insulin aspart  0-9 Units Subcutaneous TID WC   insulin glargine-yfgn  20 Units Subcutaneous QHS   isosorbide mononitrate  30 mg Oral Daily   levothyroxine  100 mcg Oral q morning   pantoprazole  40 mg Oral Daily   sodium chloride flush  3 mL Intravenous Q12H   umeclidinium-vilanterol  1 puff Inhalation Daily   Continuous Infusions:  heparin 1,300 Units/hr (01/14/22 1224)    LOS: 2 days   Time spent: 13min  Zakaiya Lares C Erial Fikes, DO [facility]  If 7PM-7AM, please  contact night-coverage [email or web address]  01/15/2022, 7:59 AM

## 2022-01-15 NOTE — Progress Notes (Signed)
Initial Nutrition Assessment  DOCUMENTATION CODES:  Not applicable  INTERVENTION:  Liberalize diet to carb modified with no added salt Glucerna Shake po TID, each supplement provides 220 kcal and 10 grams of protein Multivitamin with minerals daily  NUTRITION DIAGNOSIS:  Increased nutrient needs related to chronic illness (CHF, COPD) as evidenced by estimated needs.  GOAL:  Patient will meet greater than or equal to 90% of their needs  MONITOR:  PO intake, Supplement acceptance, I & O's, Weight trends  REASON FOR ASSESSMENT:  Consult Assessment of nutrition requirement/status, Diet education  ASSESSMENT:  Pt with hx of CAD s/p CABG, CHF, CKD3, HTN, HLD, COPD DM type 2, presented to ED with respiratory distress at home. Found to have suffered NSTEMI and acute CHF exacerbation  Pt resting in bedside chair at the time of assessment. Very hard of hearing, difficult to discuss nutrition hx. Pt reports his appetite is fair. States he does drink ensure at home.   Due to advanced age, will liberalize diet to allow pt to have more dining choices. Recommend no salt added to tray. Will also add glucerna to promote adequate nutrition intake  Net IO Since Admission: -923.28 mL [01/15/22 1532]  Average Meal Intake: 6/19-6/20: 75% intake x 2 recorded meals  Nutritionally Relevant Medications: Scheduled Meds:  atorvastatin  80 mg Oral Daily   furosemide  40 mg Intravenous BID   insulin aspart  0-9 Units Subcutaneous TID WC   insulin glargine-yfgn  20 Units Subcutaneous QHS   pantoprazole  40 mg Oral Daily   PRN Meds: polyethylene glycol  Labs Reviewed: BUN 44, creatinine 2.88 CBG ranges from 131-196 mg/dL over the last 24 hours  NUTRITION - FOCUSED PHYSICAL EXAM: Flowsheet Row Most Recent Value  Orbital Region No depletion  Upper Arm Region No depletion  Thoracic and Lumbar Region No depletion  Buccal Region No depletion  Temple Region Mild depletion  Clavicle Bone Region No  depletion  Clavicle and Acromion Bone Region No depletion  Scapular Bone Region No depletion  Dorsal Hand No depletion  Patellar Region No depletion  Anterior Thigh Region No depletion  Posterior Calf Region No depletion  Edema (RD Assessment) Mild  [BLE]  Hair Reviewed  Eyes Reviewed  Mouth Reviewed  Skin Reviewed  Nails Reviewed   Diet Order:   Diet Order             Diet Carb Modified Fluid consistency: Thin; Room service appropriate? Yes  Diet effective now                   EDUCATION NEEDS:  Not appropriate for education at this time  Skin:  Skin Assessment: Reviewed RN Assessment  Last BM:  6/19  Height:  Ht Readings from Last 1 Encounters:  01/13/22 5\' 11"  (1.803 m)    Weight:  Wt Readings from Last 1 Encounters:  01/15/22 101.8 kg    Ideal Body Weight:  78.2 kg  BMI:  Body mass index is 31.31 kg/m.  Estimated Nutritional Needs:  Kcal:  1900-2100 kcal/d Protein:  95-110g/d Fluid:  2L/d    Ranell Patrick, RD, LDN Clinical Dietitian RD pager # available in AMION  After hours/weekend pager # available in [facility]

## 2022-01-16 ENCOUNTER — Encounter (HOSPITAL_COMMUNITY): Payer: Self-pay | Admitting: Internal Medicine

## 2022-01-16 DIAGNOSIS — I214 Non-ST elevation (NSTEMI) myocardial infarction: Secondary | ICD-10-CM | POA: Diagnosis not present

## 2022-01-16 DIAGNOSIS — J9621 Acute and chronic respiratory failure with hypoxia: Secondary | ICD-10-CM | POA: Diagnosis not present

## 2022-01-16 DIAGNOSIS — I5023 Acute on chronic systolic (congestive) heart failure: Secondary | ICD-10-CM | POA: Diagnosis not present

## 2022-01-16 LAB — BASIC METABOLIC PANEL
Anion gap: 8 (ref 5–15)
BUN: 40 mg/dL — ABNORMAL HIGH (ref 8–23)
CO2: 28 mmol/L (ref 22–32)
Calcium: 9.1 mg/dL (ref 8.9–10.3)
Chloride: 106 mmol/L (ref 98–111)
Creatinine, Ser: 2.48 mg/dL — ABNORMAL HIGH (ref 0.61–1.24)
GFR, Estimated: 24 mL/min — ABNORMAL LOW (ref >60)
Glucose, Bld: 151 mg/dL — ABNORMAL HIGH (ref 70–99)
Potassium: 4.2 mmol/L (ref 3.5–5.1)
Sodium: 142 mmol/L (ref 135–145)

## 2022-01-16 LAB — CBC
HCT: 38 % — ABNORMAL LOW (ref 39.0–52.0)
Hemoglobin: 12.4 g/dL — ABNORMAL LOW (ref 13.0–17.0)
MCH: 25.8 pg — ABNORMAL LOW (ref 26.0–34.0)
MCHC: 32.6 g/dL (ref 30.0–36.0)
MCV: 79 fL — ABNORMAL LOW (ref 80.0–100.0)
Platelets: 153 10*3/uL (ref 150–400)
RBC: 4.81 MIL/uL (ref 4.22–5.81)
RDW: 16.5 % — ABNORMAL HIGH (ref 11.5–15.5)
WBC: 11.8 10*3/uL — ABNORMAL HIGH (ref 4.0–10.5)
nRBC: 0 % (ref 0.0–0.2)

## 2022-01-16 LAB — GLUCOSE, CAPILLARY
Glucose-Capillary: 102 mg/dL — ABNORMAL HIGH (ref 70–99)
Glucose-Capillary: 161 mg/dL — ABNORMAL HIGH (ref 70–99)
Glucose-Capillary: 161 mg/dL — ABNORMAL HIGH (ref 70–99)
Glucose-Capillary: 241 mg/dL — ABNORMAL HIGH (ref 70–99)

## 2022-01-16 MED ORDER — INSULIN ASPART 100 UNIT/ML IJ SOLN
0.0000 [IU] | Freq: Every day | INTRAMUSCULAR | Status: DC
  Administered 2022-01-16: 2 [IU] via SUBCUTANEOUS

## 2022-01-16 MED ORDER — HEPARIN SODIUM (PORCINE) 5000 UNIT/ML IJ SOLN
5000.0000 [IU] | Freq: Three times a day (TID) | INTRAMUSCULAR | Status: DC
  Administered 2022-01-16 – 2022-01-19 (×9): 5000 [IU] via SUBCUTANEOUS
  Filled 2022-01-16 (×8): qty 1

## 2022-01-16 MED ORDER — INSULIN ASPART 100 UNIT/ML IJ SOLN: 0.0000 [IU] | Freq: Three times a day (TID) | INTRAMUSCULAR | Status: DC

## 2022-01-16 NOTE — Progress Notes (Signed)
RT placed patient on CPAP HS. 2L O2 bleed in needed. Patient tolerating well at this time. 

## 2022-01-16 NOTE — Progress Notes (Addendum)
Progress Note  Patient Name: Jacob Silva Date of Encounter: 01/16/2022  [facility] Cardiologist: None   Subjective   Sitting up eating breakfast. No complaints. Wife at the bedside.   Inpatient Medications    Scheduled Meds:  allopurinol  100 mg Oral Daily   arformoterol  15 mcg Nebulization Q12H   aspirin EC  81 mg Oral Daily   atorvastatin  80 mg Oral Daily   clopidogrel  75 mg Oral Daily   dapagliflozin propanediol  10 mg Oral Daily   feeding supplement (GLUCERNA SHAKE)  237 mL Oral TID BM   furosemide  40 mg Intravenous BID   gabapentin  400 mg Oral BID   heparin injection (subcutaneous)  5,000 Units Subcutaneous Q8H   insulin aspart  0-9 Units Subcutaneous TID WC   insulin glargine-yfgn  20 Units Subcutaneous QHS   isosorbide mononitrate  30 mg Oral Daily   levothyroxine  100 mcg Oral q morning   multivitamin with minerals  1 tablet Oral Daily   pantoprazole  40 mg Oral Daily   sodium chloride flush  3 mL Intravenous Q12H   umeclidinium-vilanterol  1 puff Inhalation Daily   Continuous Infusions:  PRN Meds: acetaminophen **OR** acetaminophen, albuterol, polyethylene glycol   Vital Signs    Vitals:   01/16/22 0552 01/16/22 0737 01/16/22 0836 01/16/22 0841  BP: (!) 142/84 118/68    Pulse: 94 73    Resp: 16 16    Temp: 97.7 F (36.5 C) 98.1 F (36.7 C)    TempSrc: Oral Oral    SpO2: 91% 96% 96% 100%  Weight:      Height:        Intake/Output Summary (Last 24 hours) at 01/16/2022 0900 Last data filed at 01/15/2022 2045 Gross per 24 hour  Intake 600 ml  Output 950 ml  Net -350 ml      01/16/2022    5:00 AM 01/15/2022    5:15 AM 01/14/2022    6:47 AM  Last 3 Weights  Weight (lbs) 222 lb 10.6 oz 224 lb 8 oz 226 lb 13.7 oz  Weight (kg) 101 kg 101.833 kg 102.9 kg      Telemetry    Sinus Rhythm 60s  - Personally Reviewed  ECG    No new tracing  Physical Exam   GEN: No acute distress, HOH, wearing  @2L .   Neck: No JVD Cardiac: RRR,  no murmurs, rubs, or gallops.  Respiratory: Clear to auscultation bilaterally. GI: Soft, nontender, non-distended  MS: 1+ bilateral LE edema; No deformity. Neuro:  Nonfocal  Psych: Normal affect   Labs    High Sensitivity Troponin:   Recent Labs  Lab 01/13/22 1747 01/13/22 2056 01/13/22 2219 01/14/22 0925 01/14/22 1059  TROPONINIHS 318* 577* 641* 354* 458*     Chemistry Recent Labs  Lab 01/13/22 1552 01/13/22 1716 01/13/22 2056 01/14/22 0317 01/15/22 0304 01/16/22 0155  NA 140   < >  --  142 142 142  K 4.7   < >  --  4.8 4.5 4.2  CL 108  --   --  105 106 106  CO2 18*  --   --  25 28 28   GLUCOSE 139*  --   --  201* 116* 151*  BUN 36*  --   --  39* 44* 40*  CREATININE 2.90*  --   --  2.93* 2.88* 2.48*  CALCIUM 8.4*  --   --  8.9 8.7* 9.1  MG  --   --  1.8  --   --   --   PROT 5.7*  --   --  6.3*  --   --   ALBUMIN 3.1*  --   --  3.2*  --   --   AST 51*  --   --  31  --   --   ALT 38  --   --  35  --   --   ALKPHOS 114  --   --  127*  --   --   BILITOT 0.7  --   --  0.3  --   --   GFRNONAA 20*  --   --  20* 20* 24*  ANIONGAP 14  --   --  12 8 8    < > = values in this interval not displayed.    Lipids No results for input(s): "CHOL", "TRIG", "HDL", "LABVLDL", "LDLCALC", "CHOLHDL" in the last 168 hours.  Hematology Recent Labs  Lab 01/14/22 0317 01/15/22 0304 01/16/22 0155  WBC 9.5 12.4* 11.8*  RBC 5.05 4.62 4.81  HGB 13.3 12.2* 12.4*  HCT 39.5 36.9* 38.0*  MCV 78.2* 79.9* 79.0*  MCH 26.3 26.4 25.8*  MCHC 33.7 33.1 32.6  RDW 16.6* 16.8* 16.5*  PLT 164 161 153   Thyroid No results for input(s): "TSH", "FREET4" in the last 168 hours.  BNP Recent Labs  Lab 01/13/22 1552  BNP 529.5*    DDimer No results for input(s): "DDIMER" in the last 168 hours.   Radiology    ECHOCARDIOGRAM COMPLETE  Result Date: 01/14/2022    ECHOCARDIOGRAM REPORT   Patient Name:   Jacob Silva Date of Exam: 01/14/2022 Medical Rec #:  [id number]       Height:       71.0 in  Accession #:    [id number]      Weight:       226.9 lb Date of Birth:  02-Mar-1932       BSA:          2.225 m Patient Age:    88 years        BP:           145/60 mmHg Patient Gender: M               HR:           56 bpm. Exam Location:  Inpatient Procedure: 2D Echo, Cardiac Doppler and Color Doppler Indications:    Dyspnea  History:        Patient has no prior history of Echocardiogram examinations.                 Prior CABG, Signs/Symptoms:Shortness of Breath; Risk                 Factors:Hypertension, Dyslipidemia and Diabetes.  Sonographer:    Clayton Lefort RDCS (AE) Referring Phys: [id number] Candace Gallus [facility]  Sonographer Comments: Technically difficult study due to poor echo windows. IMPRESSIONS  1. Left ventricular ejection fraction, by estimation, is 55 to 60%. The left ventricle has normal function. The left ventricle has no regional wall motion abnormalities. Left ventricular diastolic parameters are indeterminate. There is the interventricular septum is flattened in systole and diastole, consistent with right ventricular pressure and volume overload.  2. Right ventricular systolic function is severely reduced. The right ventricular size is severely enlarged. There is severely elevated pulmonary artery systolic pressure. The estimated right ventricular systolic pressure is 30.8 mmHg.  3. Right atrial size  was severely dilated.  4. The mitral valve is normal in structure. Trivial mitral valve regurgitation. No evidence of mitral stenosis.  5. Tricuspid valve regurgitation is severe.  6. The aortic valve is tricuspid. There is mild calcification of the aortic valve. Aortic valve regurgitation is not visualized. Aortic valve sclerosis/calcification is present, without any evidence of aortic stenosis. Aortic valve area, by VTI measures  2.61 cm. Aortic valve mean gradient measures 3.0 mmHg. Aortic valve Vmax measures 1.16 m/s.  7. The inferior vena cava is normal in size with greater than 50% respiratory  variability, suggesting right atrial pressure of 3 mmHg. FINDINGS  Left Ventricle: Left ventricular ejection fraction, by estimation, is 55 to 60%. The left ventricle has normal function. The left ventricle has no regional wall motion abnormalities. The left ventricular internal cavity size was normal in size. There is  no left ventricular hypertrophy. The interventricular septum is flattened in systole and diastole, consistent with right ventricular pressure and volume overload. Left ventricular diastolic parameters are indeterminate. Right Ventricle: The right ventricular size is severely enlarged. No increase in right ventricular wall thickness. Right ventricular systolic function is severely reduced. There is severely elevated pulmonary artery systolic pressure. The tricuspid regurgitant velocity is 3.68 m/s, and with an assumed right atrial pressure of 15 mmHg, the estimated right ventricular systolic pressure is 92.1 mmHg. Left Atrium: Left atrial size was normal in size. Right Atrium: Right atrial size was severely dilated. Pericardium: There is no evidence of pericardial effusion. Mitral Valve: The mitral valve is normal in structure. Trivial mitral valve regurgitation. No evidence of mitral valve stenosis. Tricuspid Valve: The tricuspid valve is normal in structure. Tricuspid valve regurgitation is severe. No evidence of tricuspid stenosis. Aortic Valve: The aortic valve is tricuspid. There is mild calcification of the aortic valve. Aortic valve regurgitation is not visualized. Aortic valve sclerosis/calcification is present, without any evidence of aortic stenosis. Aortic valve mean gradient measures 3.0 mmHg. Aortic valve peak gradient measures 5.4 mmHg. Aortic valve area, by VTI measures 2.61 cm. Pulmonic Valve: The pulmonic valve was normal in structure. Pulmonic valve regurgitation is mild to moderate. No evidence of pulmonic stenosis. Aorta: The aortic root is normal in size and structure. Venous:  The inferior vena cava is normal in size with greater than 50% respiratory variability, suggesting right atrial pressure of 3 mmHg. IAS/Shunts: No atrial level shunt detected by color flow Doppler.  LEFT VENTRICLE PLAX 2D LVIDd:         4.40 cm      Diastology LVIDs:         2.90 cm      LV e' medial:    3.53 cm/s LV PW:         1.10 cm      LV E/e' medial:  16.3 LV IVS:        1.10 cm      LV e' lateral:   8.48 cm/s LVOT diam:     2.00 cm      LV E/e' lateral: 6.8 LV SV:         65 LV SV Index:   29 LVOT Area:     3.14 cm  LV Volumes (MOD) LV vol d, MOD A2C: 109.0 ml LV vol d, MOD A4C: 49.0 ml LV vol s, MOD A2C: 62.3 ml LV vol s, MOD A4C: 30.7 ml LV SV MOD A2C:     46.7 ml LV SV MOD A4C:     49.0 ml LV SV  MOD BP:      31.3 ml RIGHT VENTRICLE            IVC RV Basal diam:  3.80 cm    IVC diam: 2.40 cm RV S prime:     5.93 cm/s TAPSE (M-mode): 0.9 cm LEFT ATRIUM           Index        RIGHT ATRIUM           Index LA diam:      3.80 cm 1.71 cm/m   RA Area:     27.20 cm LA Vol (A2C): 44.6 ml 20.05 ml/m  RA Volume:   103.00 ml 46.30 ml/m LA Vol (A4C): 40.0 ml 17.98 ml/m  AORTIC VALVE AV Area (Vmax):    2.49 cm AV Area (Vmean):   2.54 cm AV Area (VTI):     2.61 cm AV Vmax:           116.00 cm/s AV Vmean:          79.300 cm/s AV VTI:            0.249 m AV Peak Grad:      5.4 mmHg AV Mean Grad:      3.0 mmHg LVOT Vmax:         92.00 cm/s LVOT Vmean:        64.200 cm/s LVOT VTI:          0.207 m LVOT/AV VTI ratio: 0.83  AORTA Ao Root diam: 3.60 cm MITRAL VALVE               TRICUSPID VALVE MV Area (PHT): 1.70 cm    TR Peak grad:   54.2 mmHg MV Decel Time: 446 msec    TR Vmax:        368.00 cm/s MV E velocity: 57.70 cm/s MV A velocity: 58.80 cm/s  SHUNTS MV E/A ratio:  0.98        Systemic VTI:  0.21 m                            Systemic Diam: 2.00 cm Glori Bickers MD Electronically signed by Glori Bickers MD Signature Date/Time: 01/14/2022/4:23:26 PM    Final     Cardiac Studies   Echo: 01/14/22    IMPRESSIONS     1. Left ventricular ejection fraction, by estimation, is 55 to 60%. The  left ventricle has normal function. The left ventricle has no regional  wall motion abnormalities. Left ventricular diastolic parameters are  indeterminate. There is the  interventricular septum is flattened in systole and diastole, consistent  with right ventricular pressure and volume overload.   2. Right ventricular systolic function is severely reduced. The right  ventricular size is severely enlarged. There is severely elevated  pulmonary artery systolic pressure. The estimated right ventricular  systolic pressure is 66.2 mmHg.   3. Right atrial size was severely dilated.   4. The mitral valve is normal in structure. Trivial mitral valve  regurgitation. No evidence of mitral stenosis.   5. Tricuspid valve regurgitation is severe.   6. The aortic valve is tricuspid. There is mild calcification of the  aortic valve. Aortic valve regurgitation is not visualized. Aortic valve  sclerosis/calcification is present, without any evidence of aortic  stenosis. Aortic valve area, by VTI measures   2.61 cm. Aortic valve mean gradient measures 3.0 mmHg. Aortic valve Vmax  measures 1.16 m/s.  7. The inferior vena cava is normal in size with greater than 50%  respiratory variability, suggesting right atrial pressure of 3 mmHg.   FINDINGS   Left Ventricle: Left ventricular ejection fraction, by estimation, is 55  to 60%. The left ventricle has normal function. The left ventricle has no  regional wall motion abnormalities. The left ventricular internal cavity  size was normal in size. There is   no left ventricular hypertrophy. The interventricular septum is flattened  in systole and diastole, consistent with right ventricular pressure and  volume overload. Left ventricular diastolic parameters are indeterminate.   Right Ventricle: The right ventricular size is severely enlarged. No  increase in right  ventricular wall thickness. Right ventricular systolic  function is severely reduced. There is severely elevated pulmonary artery  systolic pressure. The tricuspid  regurgitant velocity is 3.68 m/s, and with an assumed right atrial  pressure of 15 mmHg, the estimated right ventricular systolic pressure is  69.6 mmHg.   Left Atrium: Left atrial size was normal in size.   Right Atrium: Right atrial size was severely dilated.   Pericardium: There is no evidence of pericardial effusion.   Mitral Valve: The mitral valve is normal in structure. Trivial mitral  valve regurgitation. No evidence of mitral valve stenosis.   Tricuspid Valve: The tricuspid valve is normal in structure. Tricuspid  valve regurgitation is severe. No evidence of tricuspid stenosis.   Aortic Valve: The aortic valve is tricuspid. There is mild calcification  of the aortic valve. Aortic valve regurgitation is not visualized. Aortic  valve sclerosis/calcification is present, without any evidence of aortic  stenosis. Aortic valve mean  gradient measures 3.0 mmHg. Aortic valve peak gradient measures 5.4 mmHg.  Aortic valve area, by VTI measures 2.61 cm.   Pulmonic Valve: The pulmonic valve was normal in structure. Pulmonic valve  regurgitation is mild to moderate. No evidence of pulmonic stenosis.   Aorta: The aortic root is normal in size and structure.   Venous: The inferior vena cava is normal in size with greater than 50%  respiratory variability, suggesting right atrial pressure of 3 mmHg.   IAS/Shunts: No atrial level shunt detected by color flow Doppler.   Patient Profile     87 y.o. 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  Initially required  CPAP.  Assessment & Plan    Non-STEMI: Troponin trending up 119>318>577>641>>458. EKG with T wave inversion in lead V1 to V4.  Unknown baseline. No chest pain.   -- Poor candidate for cardiac catheterization given elevated renal function and advanced age.  Per wife, previously cardiac catheterization was deferred due to renal function.  -- Continue aspirin 81 mg daily and Plavix 75 mg daily, IV heparin (48 hrs total completed), Imdur   HFpEF with severely reduced RV: BNP 529, Chest x-ray with pulmonary vascular congestion and possible interstitial edema. Past 2 months patient has been dealing with volume exacerbation. His Lasix was changed to torsemide.  Baseline weight 210 pounds.  He was doing well on torsemide but had recurrent volume exacerbation along with worsening renal function. -- Evidence of volume overload by exam and labs on admission.  -- continue on Lasix IV 40 mg twice daily, net -983cc.  -- echo showed LVEF of 55-60% with severely reduced RV  function, severely dilated right atrium, RVSP 69 mmHg. This is new from previously echo report (wife has paper copy) from 10/2021. May be thromboembolic? Consider VQ scan though suspect could be from his long standing COPD -- Home benazepril on hold, coreg stopped yesterday with hypotension. Wife reports his BPs have been low frequently at home. Medical therapy is limited -- Continue Imdur and Farxiga   Acute on chronic kidney disease stage III/IV:Gradual worsening renal function for past 6 months. Cr 2.9>>2.8>>2.48 -- holding ACEi -- follow BMET with diuresis   Hyperlipidemia: LDL was 27 last month -- Continue high intensity statin   Per primary COPD- chronically O2 dependent. Wife notes he has declined to see pulmonary in the past DM Anemia Gout  For questions or updates, please contact [facility] Please consult [email or web address] for contact info under   Signed, Reino Bellis, NP  01/16/2022, 9:00 AM    I have personally seen  and examined this patient. I agree with the assessment and plan as outlined above.  He is doing well today. Still with volume overload. Diuresing well. Continue IV Lasix today. Coreg and Benazepril on hold. Renal function stable.  No plans for ischemic evaluation.   Lauree Chandler, MD, [facility] 01/16/2022 10:06 AM

## 2022-01-16 NOTE — Progress Notes (Signed)
PROGRESS NOTE    Jacob Silva  LOV:[id number] DOB: 10/20/1931 DOA: 01/13/2022 PCP: [name], MD   Brief Narrative:  Jacob Silva is a 88 y.o. male with medical history significant of CAD status post CABG, CKD 3a, diabetes, gout, hyperlipidemia, hypertension, hypothyroidism, anemia, coagulopathy, COPD on 2L [place] at home - presenting with sudden onset shortness of breath.  Assessment & Plan:   Principal Problem:   Acute on chronic respiratory failure with hypoxia (HCC) Active Problems:   CAD (coronary artery disease)   Diabetes mellitus type II, controlled ([place])   Gout   HTN (hypertension)   Hypercholesterolemia   Hypothyroidism   Chronic obstructive pulmonary disease, unspecified (HCC)   Acute renal failure superimposed on stage 3b chronic kidney disease (HCC)   CHF exacerbation (HCC)   NSTEMI (non-ST elevated myocardial infarction) (HCC)  Acute on chronic respiratory failure with hypoxia Acute heart failure exacerbation, diastolic with severely reduced right ventricular function -EF 55 to 60% Concurrent NSTEMI in the setting of above CAD, status post CABG Hyperlipidemia -Echo shows EF 55 to 60% with severely reduced RV function - Cardiology following, appreciate insight recommendations -Holding carvedilol today given hypotension to ensure room for ongoing diuresis - follow I's and O's - urine output not as robust as expected given IV diuretics -unclear if I's and O's are appropriately being collected - Continue home aspirin, Plavix, carvedilol, Imdur, statin -holding ACE inhibitor  - Hold ivabradine -unclear if this is an ongoing medication   Questionable AKI versus advancing renal disease with history of CKD 3a per report -Follow urine output and creatinine, previous baseline appears to be around 2 (although limited history) -Creatinine downtrending appropriately with diuretics currently 2.5 -Continue to follow closely the setting of diuretics   COPD, not in  acute exacerbation - Continue home Anoro Ellipta - Continue as needed albuterol  Diabetes, insulin-dependent, well controlled - A1c 7.4  - Home regimen - 35 units nightly at home - Continue sliding scale insulin, 20u at bedtime, hypoglycemic protocol while here  Gout - Continue home allopurinol  Hypertension Continue home medications   Leukemoid reaction -White count remains somewhat labile but just above normal limits today, likely reactive -No fevers or other complaints indicative of infectious process  Hypothyroidism - Continue home Synthroid   DVT prophylaxis:  Heparin  Code Status: DNR Family Communication: Wife at bedside  Status is: Inpatient  Dispo: The patient is from: Home              Anticipated d/c is to: Home              Anticipated d/c date is: 48 to 72 hours              Patient currently not medically stable for discharge  Consultants:  Cardiology  Procedures:  None  Antimicrobials:  None currently indicated  Subjective: No acute issues or events overnight denies nausea vomiting diarrhea constipation headache fever chills or chest pain.  Dyspnea orthopnea and lower extremity edema are drastically improving.  Objective: Vitals:   01/15/22 1421 01/15/22 1929 01/15/22 2052 01/15/22 2131  BP: 92/65  129/77   Pulse: 61 83 84 84  Resp: 15 18 19    Temp: 97.7 F (36.5 C)  97.7 F (36.5 C)   TempSrc: Oral  Oral   SpO2: 96% 96% 98% 95%  Weight:      Height:        Intake/Output Summary (Last 24 hours) at 01/16/2022 8841 Last data filed  at 01/15/2022 2045 Gross per 24 hour  Intake 600 ml  Output 1400 ml  Net -800 ml    Filed Weights   01/13/22 2126 01/14/22 0647 01/15/22 0515  Weight: 102.3 kg 102.9 kg 101.8 kg    Examination:  General:  Pleasantly resting in bed, No acute distress. HEENT:  Normocephalic atraumatic.  Sclerae nonicteric, noninjected.  Extraocular movements intact bilaterally. Neck:  Without mass or deformity.  Trachea  is midline. Lungs: Bilateral rales without wheezes or rhonchi. Heart:  Regular rate and rhythm.  Without murmurs, rubs, or gallops. Abdomen:  Soft, nontender, nondistended.  Without guarding or rebound. Extremities: 3+ pitting edema bilateral lower extremities to the knee   Data Reviewed: I have personally reviewed following labs and imaging studies  CBC: Recent Labs  Lab 01/13/22 1552 01/13/22 1716 01/14/22 0317 01/15/22 0304 01/16/22 0155  WBC 11.2*  --  9.5 12.4* 11.8*  HGB 12.5* 11.6* 13.3 12.2* 12.4*  HCT 38.5* 34.0* 39.5 36.9* 38.0*  MCV 81.6  --  78.2* 79.9* 79.0*  PLT 163  --  164 161 967    Basic Metabolic Panel: Recent Labs  Lab 01/13/22 1552 01/13/22 1716 01/13/22 2056 01/14/22 0317 01/15/22 0304 01/16/22 0155  NA 140 145  --  142 142 142  K 4.7 3.9  --  4.8 4.5 4.2  CL 108  --   --  105 106 106  CO2 18*  --   --  25 28 28   GLUCOSE 139*  --   --  201* 116* 151*  BUN 36*  --   --  39* 44* 40*  CREATININE 2.90*  --   --  2.93* 2.88* 2.48*  CALCIUM 8.4*  --   --  8.9 8.7* 9.1  MG  --   --  1.8  --   --   --     GFR: Estimated Creatinine Clearance: 25 mL/min (A) (by C-G formula based on SCr of 2.48 mg/dL (H)). Liver Function Tests: Recent Labs  Lab 01/13/22 1552 01/14/22 0317  AST 51* 31  ALT 38 35  ALKPHOS 114 127*  BILITOT 0.7 0.3  PROT 5.7* 6.3*  ALBUMIN 3.1* 3.2*    No results for input(s): "LIPASE", "AMYLASE" in the last 168 hours. No results for input(s): "AMMONIA" in the last 168 hours. Coagulation Profile: Recent Labs  Lab 01/13/22 1552  INR 1.3*    Cardiac Enzymes: No results for input(s): "CKTOTAL", "CKMB", "CKMBINDEX", "TROPONINI" in the last 168 hours. BNP (last 3 results) No results for input(s): "PROBNP" in the last 8760 hours. HbA1C: Recent Labs    01/15/22 0304  HGBA1C 7.4*    CBG: Recent Labs  Lab 01/14/22 2117 01/15/22 0849 01/15/22 1222 01/15/22 1615 01/15/22 2106  GLUCAP 196* 131* 139* 103* 189*     Lipid Profile: No results for input(s): "CHOL", "HDL", "LDLCALC", "TRIG", "CHOLHDL", "LDLDIRECT" in the last 72 hours. Thyroid Function Tests: No results for input(s): "TSH", "T4TOTAL", "FREET4", "T3FREE", "THYROIDAB" in the last 72 hours. Anemia Panel: No results for input(s): "VITAMINB12", "FOLATE", "FERRITIN", "TIBC", "IRON", "RETICCTPCT" in the last 72 hours. Sepsis Labs: No results for input(s): "PROCALCITON", "LATICACIDVEN" in the last 168 hours.  Recent Results (from the past 240 hour(s))  SARS Coronavirus 2 by RT PCR (hospital order, performed in [facility] hospital lab) *cepheid single result test* Anterior Nasal Swab     Status: None   Collection Time: 01/13/22  3:41 PM   Specimen: Anterior Nasal Swab  Result Value Ref Range  Status   SARS Coronavirus 2 by RT PCR NEGATIVE NEGATIVE Final    Comment: (NOTE) SARS-CoV-2 target nucleic acids are NOT DETECTED.  The SARS-CoV-2 RNA is generally detectable in upper and lower respiratory specimens during the acute phase of infection. The lowest concentration of SARS-CoV-2 viral copies this assay can detect is 250 copies / mL. A negative result does not preclude SARS-CoV-2 infection and should not be used as the sole basis for treatment or other patient management decisions.  A negative result may occur with improper specimen collection / handling, submission of specimen other than nasopharyngeal swab, presence of viral mutation(s) within the areas targeted by this assay, and inadequate number of viral copies (<250 copies / mL). A negative result must be combined with clinical observations, patient history, and epidemiological information.  Fact Sheet for Patients:   https://www.patel.info/  Fact Sheet for Healthcare Providers: https://hall.com/  This test is not yet approved or  cleared by the [place] FDA and has been authorized for detection and/or diagnosis of SARS-CoV-2  by FDA under an Emergency Use Authorization (EUA).  This EUA will remain in effect (meaning this test can be used) for the duration of the COVID-19 declaration under Section 564(b)(1) of the Act, 21 U.S.C. section 360bbb-3(b)(1), unless the authorization is terminated or revoked sooner.  Performed at [facility], [place] [address] [place], [place] [address]     Radiology Studies: ECHOCARDIOGRAM COMPLETE  Result Date: 01/14/2022    ECHOCARDIOGRAM REPORT   Patient Name:   HITESH FOUCHE Date of Exam: 01/14/2022 Medical Rec #:  [id number]       Height:       71.0 in Accession #:    [id number]      Weight:       226.9 lb Date of Birth:  [birth year]       BSA:          2.225 m Patient Age:    60 years        BP:           145/60 mmHg Patient Gender: M               HR:           56 bpm. Exam Location:  Inpatient Procedure: 2D Echo, Cardiac Doppler and Color Doppler Indications:    Dyspnea  History:        Patient has no prior history of Echocardiogram examinations.                 Prior CABG, Signs/Symptoms:Shortness of Breath; Risk                 Factors:Hypertension, Dyslipidemia and Diabetes.  Sonographer:    Clayton Lefort RDCS (AE) Referring Phys: [id number] Candace Gallus [facility]  Sonographer Comments: Technically difficult study due to poor echo windows. IMPRESSIONS  1. Left ventricular ejection fraction, by estimation, is 55 to 60%. The left ventricle has normal function. The left ventricle has no regional wall motion abnormalities. Left ventricular diastolic parameters are indeterminate. There is the interventricular septum is flattened in systole and diastole, consistent with right ventricular pressure and volume overload.  2. Right ventricular systolic function is severely reduced. The right ventricular size is severely enlarged. There is severely elevated pulmonary artery systolic pressure. The estimated right ventricular systolic pressure is 20.9 mmHg.  3. Right atrial size was severely dilated.  4.  The mitral valve is normal in structure. Trivial mitral valve regurgitation. No evidence  of mitral stenosis.  5. Tricuspid valve regurgitation is severe.  6. The aortic valve is tricuspid. There is mild calcification of the aortic valve. Aortic valve regurgitation is not visualized. Aortic valve sclerosis/calcification is present, without any evidence of aortic stenosis. Aortic valve area, by VTI measures  2.61 cm. Aortic valve mean gradient measures 3.0 mmHg. Aortic valve Vmax measures 1.16 m/s.  7. The inferior vena cava is normal in size with greater than 50% respiratory variability, suggesting right atrial pressure of 3 mmHg. FINDINGS  Left Ventricle: Left ventricular ejection fraction, by estimation, is 55 to 60%. The left ventricle has normal function. The left ventricle has no regional wall motion abnormalities. The left ventricular internal cavity size was normal in size. There is  no left ventricular hypertrophy. The interventricular septum is flattened in systole and diastole, consistent with right ventricular pressure and volume overload. Left ventricular diastolic parameters are indeterminate. Right Ventricle: The right ventricular size is severely enlarged. No increase in right ventricular wall thickness. Right ventricular systolic function is severely reduced. There is severely elevated pulmonary artery systolic pressure. The tricuspid regurgitant velocity is 3.68 m/s, and with an assumed right atrial pressure of 15 mmHg, the estimated right ventricular systolic pressure is 32.9 mmHg. Left Atrium: Left atrial size was normal in size. Right Atrium: Right atrial size was severely dilated. Pericardium: There is no evidence of pericardial effusion. Mitral Valve: The mitral valve is normal in structure. Trivial mitral valve regurgitation. No evidence of mitral valve stenosis. Tricuspid Valve: The tricuspid valve is normal in structure. Tricuspid valve regurgitation is severe. No evidence of tricuspid  stenosis. Aortic Valve: The aortic valve is tricuspid. There is mild calcification of the aortic valve. Aortic valve regurgitation is not visualized. Aortic valve sclerosis/calcification is present, without any evidence of aortic stenosis. Aortic valve mean gradient measures 3.0 mmHg. Aortic valve peak gradient measures 5.4 mmHg. Aortic valve area, by VTI measures 2.61 cm. Pulmonic Valve: The pulmonic valve was normal in structure. Pulmonic valve regurgitation is mild to moderate. No evidence of pulmonic stenosis. Aorta: The aortic root is normal in size and structure. Venous: The inferior vena cava is normal in size with greater than 50% respiratory variability, suggesting right atrial pressure of 3 mmHg. IAS/Shunts: No atrial level shunt detected by color flow Doppler.  LEFT VENTRICLE PLAX 2D LVIDd:         4.40 cm      Diastology LVIDs:         2.90 cm      LV e' medial:    3.53 cm/s LV PW:         1.10 cm      LV E/e' medial:  16.3 LV IVS:        1.10 cm      LV e' lateral:   8.48 cm/s LVOT diam:     2.00 cm      LV E/e' lateral: 6.8 LV SV:         65 LV SV Index:   29 LVOT Area:     3.14 cm  LV Volumes (MOD) LV vol d, MOD A2C: 109.0 ml LV vol d, MOD A4C: 49.0 ml LV vol s, MOD A2C: 62.3 ml LV vol s, MOD A4C: 30.7 ml LV SV MOD A2C:     46.7 ml LV SV MOD A4C:     49.0 ml LV SV MOD BP:      31.3 ml RIGHT VENTRICLE  IVC RV Basal diam:  3.80 cm    IVC diam: 2.40 cm RV S prime:     5.93 cm/s TAPSE (M-mode): 0.9 cm LEFT ATRIUM           Index        RIGHT ATRIUM           Index LA diam:      3.80 cm 1.71 cm/m   RA Area:     27.20 cm LA Vol (A2C): 44.6 ml 20.05 ml/m  RA Volume:   103.00 ml 46.30 ml/m LA Vol (A4C): 40.0 ml 17.98 ml/m  AORTIC VALVE AV Area (Vmax):    2.49 cm AV Area (Vmean):   2.54 cm AV Area (VTI):     2.61 cm AV Vmax:           116.00 cm/s AV Vmean:          79.300 cm/s AV VTI:            0.249 m AV Peak Grad:      5.4 mmHg AV Mean Grad:      3.0 mmHg LVOT Vmax:         92.00 cm/s  LVOT Vmean:        64.200 cm/s LVOT VTI:          0.207 m LVOT/AV VTI ratio: 0.83  AORTA Ao Root diam: 3.60 cm MITRAL VALVE               TRICUSPID VALVE MV Area (PHT): 1.70 cm    TR Peak grad:   54.2 mmHg MV Decel Time: 446 msec    TR Vmax:        368.00 cm/s MV E velocity: 57.70 cm/s MV A velocity: 58.80 cm/s  SHUNTS MV E/A ratio:  0.98        Systemic VTI:  0.21 m                            Systemic Diam: 2.00 cm Glori Bickers MD Electronically signed by Glori Bickers MD Signature Date/Time: 01/14/2022/4:23:26 PM    Final     Scheduled Meds:  allopurinol  100 mg Oral Daily   arformoterol  15 mcg Nebulization Q12H   aspirin EC  81 mg Oral Daily   atorvastatin  80 mg Oral Daily   clopidogrel  75 mg Oral Daily   dapagliflozin propanediol  10 mg Oral Daily   feeding supplement (GLUCERNA SHAKE)  237 mL Oral TID BM   furosemide  40 mg Intravenous BID   gabapentin  400 mg Oral BID   insulin aspart  0-9 Units Subcutaneous TID WC   insulin glargine-yfgn  20 Units Subcutaneous QHS   isosorbide mononitrate  30 mg Oral Daily   levothyroxine  100 mcg Oral q morning   multivitamin with minerals  1 tablet Oral Daily   pantoprazole  40 mg Oral Daily   sodium chloride flush  3 mL Intravenous Q12H   umeclidinium-vilanterol  1 puff Inhalation Daily   Continuous Infusions:    LOS: 3 days   Time spent: 44min  Anala Whisenant C Rae Plotner, DO [facility]  If 7PM-7AM, please contact night-coverage [email or web address]  01/16/2022, 6:35 AM

## 2022-01-16 NOTE — Progress Notes (Signed)
Mobility Specialist Progress Note    01/16/22 1204  Mobility  Activity Ambulated with assistance in hallway  Level of Assistance Minimal assist, patient does 75% or more  Assistive Device Front wheel walker  Distance Ambulated (ft) 350 ft  Activity Response Tolerated well  $Mobility charge 1 Mobility   Pre-Mobility: 71 HR, 96% SpO2 Post-Mobility: 98 HR, 97% SpO2  Pt received in bed and agreeable. No complaints on walk. Ambulated on 3LO2, encouraged pursed lip breathing. Returned to chair with call bell in reach.    [name] Mobility Specialist

## 2022-01-17 DIAGNOSIS — I5023 Acute on chronic systolic (congestive) heart failure: Secondary | ICD-10-CM | POA: Diagnosis not present

## 2022-01-17 DIAGNOSIS — I214 Non-ST elevation (NSTEMI) myocardial infarction: Secondary | ICD-10-CM | POA: Diagnosis not present

## 2022-01-17 LAB — BASIC METABOLIC PANEL
Anion gap: 9 (ref 5–15)
BUN: 33 mg/dL — ABNORMAL HIGH (ref 8–23)
CO2: 29 mmol/L (ref 22–32)
Calcium: 9 mg/dL (ref 8.9–10.3)
Chloride: 101 mmol/L (ref 98–111)
Creatinine, Ser: 2.35 mg/dL — ABNORMAL HIGH (ref 0.61–1.24)
GFR, Estimated: 26 mL/min — ABNORMAL LOW (ref >60)
Glucose, Bld: 139 mg/dL — ABNORMAL HIGH (ref 70–99)
Potassium: 4.1 mmol/L (ref 3.5–5.1)
Sodium: 139 mmol/L (ref 135–145)

## 2022-01-17 LAB — CBC
HCT: 38.3 % — ABNORMAL LOW (ref 39.0–52.0)
Hemoglobin: 12.4 g/dL — ABNORMAL LOW (ref 13.0–17.0)
MCH: 25.7 pg — ABNORMAL LOW (ref 26.0–34.0)
MCHC: 32.4 g/dL (ref 30.0–36.0)
MCV: 79.3 fL — ABNORMAL LOW (ref 80.0–100.0)
Platelets: 152 10*3/uL (ref 150–400)
RBC: 4.83 MIL/uL (ref 4.22–5.81)
RDW: 16.8 % — ABNORMAL HIGH (ref 11.5–15.5)
WBC: 11.7 10*3/uL — ABNORMAL HIGH (ref 4.0–10.5)
nRBC: 0 % (ref 0.0–0.2)

## 2022-01-17 LAB — GLUCOSE, CAPILLARY
Glucose-Capillary: 110 mg/dL — ABNORMAL HIGH (ref 70–99)
Glucose-Capillary: 207 mg/dL — ABNORMAL HIGH (ref 70–99)
Glucose-Capillary: 121 mg/dL — ABNORMAL HIGH (ref 70–99)
Glucose-Capillary: 143 mg/dL — ABNORMAL HIGH (ref 70–99)

## 2022-01-17 NOTE — Progress Notes (Signed)
Pt already on CPAP. RT will cont to monitor.

## 2022-01-17 NOTE — Progress Notes (Signed)
PROGRESS NOTE    LYNDALL WINDT  UUV:[id number] DOB: [birth year] DOA: 01/13/2022 PCP: [name], MD   Brief Narrative:  AMIT LEECE is a 88 y.o. male with medical history significant of CAD status post CABG, CKD 3a, diabetes, gout, hyperlipidemia, hypertension, hypothyroidism, anemia, coagulopathy, COPD on 2L  at home - presenting with somewhat sudden onset shortness of breath; although wife and patient admit to more insidious worsening of orthopnea, dyspnea with exertion, and weight gain.  Assessment & Plan:   Principal Problem:   Acute on chronic respiratory failure with hypoxia (HCC) Active Problems:   CAD (coronary artery disease)   Diabetes mellitus type II, controlled ([place])   Gout   HTN (hypertension)   Hypercholesterolemia   Hypothyroidism   Chronic obstructive pulmonary disease, unspecified (HCC)   Acute renal failure superimposed on stage 3b chronic kidney disease (HCC)   CHF exacerbation (HCC)   NSTEMI (non-ST elevated myocardial infarction) (HCC)  Acute on chronic respiratory failure with hypoxia Acute heart failure exacerbation, diastolic with severely reduced right ventricular function -EF 55 to 60% Concurrent NSTEMI in the setting of above CAD, status post CABG Hyperlipidemia -Echo shows EF 55 to 60% with severely reduced RV function - Cardiology following, appreciate insight recommendations -Holding carvedilol today given hypotension to ensure room for ongoing diuresis - follow I's and O's - urine output not as robust as expected given IV diuretics - reminded patient/wife to use bedside urinal/hat in toilet to catch urine for counts - Continue home aspirin, Plavix, carvedilol, Imdur, statin - holding ACE inhibitor  - Hold ivabradine -unclear if this is an ongoing medication   Questionable AKI versus advancing renal disease with history of CKD 3a per report -Follow urine output and creatinine, previous baseline appears to be around 2 (although  limited history) -Creatinine downtrending appropriately with diuretics currently 2.5 -Continue to follow closely the setting of diuretics   COPD, not in acute exacerbation -Continue home Anoro Ellipta -Continue as needed albuterol  Diabetes, insulin-dependent, well controlled - A1c 7.4  - Home regimen - 35 units nightly at home - Continue sliding scale insulin, 20u at bedtime, hypoglycemic protocol while here  Gout - Continue home allopurinol  Hypertension Continue home medications   Leukemoid reaction -White count remains somewhat labile but just above normal limits today, likely reactive -No fevers or other complaints indicative of infectious process  Hypothyroidism - Continue home Synthroid   DVT prophylaxis:  Heparin  Code Status: DNR Family Communication: Wife at bedside  Status is: Inpatient  Dispo: The patient is from: Home              Anticipated d/c is to: Home              Anticipated d/c date is: 48 to 72 hours              Patient currently not medically stable for discharge  Consultants:  Cardiology  Procedures:  None  Antimicrobials:  None currently indicated  Subjective: No acute issues or events overnight denies nausea vomiting diarrhea constipation headache fever chills or chest pain.  Dyspnea orthopnea and lower extremity edema are improving but not yet resolved.  Objective: Vitals:   01/16/22 1948 01/16/22 2145 01/17/22 0648 01/17/22 0734  BP:   136/78 136/71  Pulse:  83 83   Resp:    (!) 22  Temp:   (!) 97.5 F (36.4 C) 98.1 F (36.7 C)  TempSrc:   Oral Oral  SpO2: 98% 95% 92%  Weight:   97.7 kg   Height:        Intake/Output Summary (Last 24 hours) at 01/17/2022 0812 Last data filed at 01/17/2022 0640 Gross per 24 hour  Intake 360 ml  Output 2225 ml  Net -1865 ml    Filed Weights   01/15/22 0515 01/16/22 0500 01/17/22 0648  Weight: 101.8 kg 101 kg 97.7 kg    Examination:  General:  Pleasantly resting in bed, No acute  distress. HEENT:  Normocephalic atraumatic.  Sclerae nonicteric, noninjected.  Extraocular movements intact bilaterally. Neck:  Without mass or deformity.  Trachea is midline. Lungs: Bilateral rales without wheezes or rhonchi. Heart:  Regular rate and rhythm.  Without murmurs, rubs, or gallops. Abdomen:  Soft, nontender, nondistended.  Without guarding or rebound. Extremities: 1+ pitting edema bilateral lower extremities to the distal lower leg   Data Reviewed: I have personally reviewed following labs and imaging studies  CBC: Recent Labs  Lab 01/13/22 1552 01/13/22 1716 01/14/22 0317 01/15/22 0304 01/16/22 0155 01/17/22 0100  WBC 11.2*  --  9.5 12.4* 11.8* 11.7*  HGB 12.5* 11.6* 13.3 12.2* 12.4* 12.4*  HCT 38.5* 34.0* 39.5 36.9* 38.0* 38.3*  MCV 81.6  --  78.2* 79.9* 79.0* 79.3*  PLT 163  --  164 161 153 836    Basic Metabolic Panel: Recent Labs  Lab 01/13/22 1552 01/13/22 1716 01/13/22 2056 01/14/22 0317 01/15/22 0304 01/16/22 0155 01/17/22 0100  NA 140 145  --  142 142 142 139  K 4.7 3.9  --  4.8 4.5 4.2 4.1  CL 108  --   --  105 106 106 101  CO2 18*  --   --  25 28 28 29   GLUCOSE 139*  --   --  201* 116* 151* 139*  BUN 36*  --   --  39* 44* 40* 33*  CREATININE 2.90*  --   --  2.93* 2.88* 2.48* 2.35*  CALCIUM 8.4*  --   --  8.9 8.7* 9.1 9.0  MG  --   --  1.8  --   --   --   --     GFR: Estimated Creatinine Clearance: 25.9 mL/min (A) (by C-G formula based on SCr of 2.35 mg/dL (H)). Liver Function Tests: Recent Labs  Lab 01/13/22 1552 01/14/22 0317  AST 51* 31  ALT 38 35  ALKPHOS 114 127*  BILITOT 0.7 0.3  PROT 5.7* 6.3*  ALBUMIN 3.1* 3.2*    No results for input(s): "LIPASE", "AMYLASE" in the last 168 hours. No results for input(s): "AMMONIA" in the last 168 hours. Coagulation Profile: Recent Labs  Lab 01/13/22 1552  INR 1.3*    Cardiac Enzymes: No results for input(s): "CKTOTAL", "CKMB", "CKMBINDEX", "TROPONINI" in the last 168 hours. BNP  (last 3 results) No results for input(s): "PROBNP" in the last 8760 hours. HbA1C: Recent Labs    01/15/22 0304  HGBA1C 7.4*    CBG: Recent Labs  Lab 01/16/22 0733 01/16/22 1121 01/16/22 1609 01/16/22 2132 01/17/22 0733  GLUCAP 102* 161* 161* 241* 110*    Lipid Profile: No results for input(s): "CHOL", "HDL", "LDLCALC", "TRIG", "CHOLHDL", "LDLDIRECT" in the last 72 hours. Thyroid Function Tests: No results for input(s): "TSH", "T4TOTAL", "FREET4", "T3FREE", "THYROIDAB" in the last 72 hours. Anemia Panel: No results for input(s): "VITAMINB12", "FOLATE", "FERRITIN", "TIBC", "IRON", "RETICCTPCT" in the last 72 hours. Sepsis Labs: No results for input(s): "PROCALCITON", "LATICACIDVEN" in the last 168 hours.  Recent Results (from the past 240  hour(s))  SARS Coronavirus 2 by RT PCR (hospital order, performed in [facility] hospital lab) *cepheid single result test* Anterior Nasal Swab     Status: None   Collection Time: 01/13/22  3:41 PM   Specimen: Anterior Nasal Swab  Result Value Ref Range Status   SARS Coronavirus 2 by RT PCR NEGATIVE NEGATIVE Final    Comment: (NOTE) SARS-CoV-2 target nucleic acids are NOT DETECTED.  The SARS-CoV-2 RNA is generally detectable in upper and lower respiratory specimens during the acute phase of infection. The lowest concentration of SARS-CoV-2 viral copies this assay can detect is 250 copies / mL. A negative result does not preclude SARS-CoV-2 infection and should not be used as the sole basis for treatment or other patient management decisions.  A negative result may occur with improper specimen collection / handling, submission of specimen other than nasopharyngeal swab, presence of viral mutation(s) within the areas targeted by this assay, and inadequate number of viral copies (<250 copies / mL). A negative result must be combined with clinical observations, patient history, and epidemiological information.  Fact Sheet for Patients:    https://www.patel.info/  Fact Sheet for Healthcare Providers: https://hall.com/  This test is not yet approved or  cleared by the [place] FDA and has been authorized for detection and/or diagnosis of SARS-CoV-2 by FDA under an Emergency Use Authorization (EUA).  This EUA will remain in effect (meaning this test can be used) for the duration of the COVID-19 declaration under Section 564(b)(1) of the Act, 21 U.S.C. section 360bbb-3(b)(1), unless the authorization is terminated or revoked sooner.  Performed at [facility], [place] [address] [place], [place] [address]     Radiology Studies: No results found.  Scheduled Meds:  allopurinol  100 mg Oral Daily   arformoterol  15 mcg Nebulization Q12H   aspirin EC  81 mg Oral Daily   atorvastatin  80 mg Oral Daily   clopidogrel  75 mg Oral Daily   dapagliflozin propanediol  10 mg Oral Daily   feeding supplement (GLUCERNA SHAKE)  237 mL Oral TID BM   furosemide  40 mg Intravenous BID   gabapentin  400 mg Oral BID   heparin injection (subcutaneous)  5,000 Units Subcutaneous Q8H   insulin aspart  0-5 Units Subcutaneous QHS   insulin aspart  0-9 Units Subcutaneous TID WC   insulin glargine-yfgn  20 Units Subcutaneous QHS   isosorbide mononitrate  30 mg Oral Daily   levothyroxine  100 mcg Oral q morning   multivitamin with minerals  1 tablet Oral Daily   pantoprazole  40 mg Oral Daily   sodium chloride flush  3 mL Intravenous Q12H   umeclidinium-vilanterol  1 puff Inhalation Daily   Continuous Infusions:    LOS: 4 days   Time spent: 19min  Valeta Paz C Shakiyla Kook, DO [facility]  If 7PM-7AM, please contact night-coverage [email or web address]  01/17/2022, 8:12 AM

## 2022-01-17 NOTE — Progress Notes (Signed)
Nutrition Education Note  RD consulted for nutrition education regarding CHF.  Wife at bedside assists with nutrition hx and with diet recall. Wife prepares all meals at home, they rarely go out to eat. No salt is used during food preparation, she uses herbs to flavor their food. Most foods are cooked from scratch with a variety of fresh fruits and vegetables. Pt normally eats a muffin for breakfast, a small snack such as a peanut butter and banana sandwich or yogurt with fruit for lunch, and then a meat with vegetables for dinner.  RD provided "Low Sodium Nutrition Therapy" handout from the Academy of Nutrition and Dietetics. Reviewed patient's dietary recall. Provided examples on ways to decrease sodium intake in diet. Discouraged intake of processed foods and use of salt shaker. Encouraged fresh fruits and vegetables as well as whole grain sources of carbohydrates to maximize fiber intake.   RD discussed why it is important for patient to adhere to diet recommendations, and emphasized the role of fluids, foods to avoid, and importance of weighing self daily. Teach back method used.  Expect good compliance.  Current diet order is carb modified with no added salt, patient is consuming approximately 83% of meals at this time. Labs and medications reviewed. No further nutrition interventions warranted at this time. RD contact information provided. If additional nutrition issues arise, please re-consult RD.    Ranell Patrick, RD, LDN Clinical Dietitian RD pager # available in [place]  After hours/weekend pager # available in [facility]

## 2022-01-17 NOTE — Progress Notes (Addendum)
Progress Note  Patient Name: Jacob Silva Date of Encounter: 01/17/2022  [facility] Cardiologist: None   Subjective   Laying in bed, comfortable. Wife at the bedside.   Inpatient Medications    Scheduled Meds:  allopurinol  100 mg Oral Daily   arformoterol  15 mcg Nebulization Q12H   aspirin EC  81 mg Oral Daily   atorvastatin  80 mg Oral Daily   clopidogrel  75 mg Oral Daily   dapagliflozin propanediol  10 mg Oral Daily   feeding supplement (GLUCERNA SHAKE)  237 mL Oral TID BM   furosemide  40 mg Intravenous BID   gabapentin  400 mg Oral BID   heparin injection (subcutaneous)  5,000 Units Subcutaneous Q8H   insulin aspart  0-5 Units Subcutaneous QHS   insulin aspart  0-9 Units Subcutaneous TID WC   insulin glargine-yfgn  20 Units Subcutaneous QHS   isosorbide mononitrate  30 mg Oral Daily   levothyroxine  100 mcg Oral q morning   multivitamin with minerals  1 tablet Oral Daily   pantoprazole  40 mg Oral Daily   sodium chloride flush  3 mL Intravenous Q12H   umeclidinium-vilanterol  1 puff Inhalation Daily   Continuous Infusions:  PRN Meds: acetaminophen **OR** acetaminophen, albuterol, polyethylene glycol   Vital Signs    Vitals:   01/16/22 1948 01/16/22 2145 01/17/22 0648 01/17/22 0734  BP:   136/78 136/71  Pulse:  83 83   Resp:    (!) 22  Temp:   (!) 97.5 F (36.4 C) 98.1 F (36.7 C)  TempSrc:   Oral Oral  SpO2: 98% 95% 92%   Weight:   97.7 kg   Height:        Intake/Output Summary (Last 24 hours) at 01/17/2022 0835 Last data filed at 01/17/2022 0640 Gross per 24 hour  Intake 360 ml  Output 2225 ml  Net -1865 ml      01/17/2022    6:48 AM 01/16/2022    5:00 AM 01/15/2022    5:15 AM  Last 3 Weights  Weight (lbs) 215 lb 4.8 oz 222 lb 10.6 oz 224 lb 8 oz  Weight (kg) 97.659 kg 101 kg 101.833 kg      Telemetry    Sinus Rhythm - Personally Reviewed  ECG    No new tracing  Physical Exam   GEN: No acute distress, HOH on 2L [place]  Neck:  No JVD Cardiac: RRR, no murmurs, rubs, or gallops.  Respiratory: Clear to auscultation bilaterally. GI: Soft, nontender, non-distended  MS: 1+ bilateral LE edema (improving); No deformity. Neuro:  Nonfocal  Psych: Normal affect   Labs    High Sensitivity Troponin:   Recent Labs  Lab 01/13/22 1747 01/13/22 2056 01/13/22 2219 01/14/22 0925 01/14/22 1059  TROPONINIHS 318* 577* 641* 354* 458*     Chemistry Recent Labs  Lab 01/13/22 1552 01/13/22 1716 01/13/22 2056 01/14/22 0317 01/15/22 0304 01/16/22 0155 01/17/22 0100  NA 140   < >  --  142 142 142 139  K 4.7   < >  --  4.8 4.5 4.2 4.1  CL 108  --   --  105 106 106 101  CO2 18*  --   --  25 28 28 29   GLUCOSE 139*  --   --  201* 116* 151* 139*  BUN 36*  --   --  39* 44* 40* 33*  CREATININE 2.90*  --   --  2.93* 2.88*  2.48* 2.35*  CALCIUM 8.4*  --   --  8.9 8.7* 9.1 9.0  MG  --   --  1.8  --   --   --   --   PROT 5.7*  --   --  6.3*  --   --   --   ALBUMIN 3.1*  --   --  3.2*  --   --   --   AST 51*  --   --  31  --   --   --   ALT 38  --   --  35  --   --   --   ALKPHOS 114  --   --  127*  --   --   --   BILITOT 0.7  --   --  0.3  --   --   --   GFRNONAA 20*  --   --  20* 20* 24* 26*  ANIONGAP 14  --   --  12 8 8 9    < > = values in this interval not displayed.    Lipids No results for input(s): "CHOL", "TRIG", "HDL", "LABVLDL", "LDLCALC", "CHOLHDL" in the last 168 hours.  Hematology Recent Labs  Lab 01/15/22 0304 01/16/22 0155 01/17/22 0100  WBC 12.4* 11.8* 11.7*  RBC 4.62 4.81 4.83  HGB 12.2* 12.4* 12.4*  HCT 36.9* 38.0* 38.3*  MCV 79.9* 79.0* 79.3*  MCH 26.4 25.8* 25.7*  MCHC 33.1 32.6 32.4  RDW 16.8* 16.5* 16.8*  PLT 161 153 152   Thyroid No results for input(s): "TSH", "FREET4" in the last 168 hours.  BNP Recent Labs  Lab 01/13/22 1552  BNP 529.5*    DDimer No results for input(s): "DDIMER" in the last 168 hours.   Radiology    No results found.  Cardiac Studies   Echo: 01/14/22    IMPRESSIONS     1. Left ventricular ejection fraction, by estimation, is 55 to 60%. The  left ventricle has normal function. The left ventricle has no regional  wall motion abnormalities. Left ventricular diastolic parameters are  indeterminate. There is the  interventricular septum is flattened in systole and diastole, consistent  with right ventricular pressure and volume overload.   2. Right ventricular systolic function is severely reduced. The right  ventricular size is severely enlarged. There is severely elevated  pulmonary artery systolic pressure. The estimated right ventricular  systolic pressure is 69.2 mmHg.   3. Right atrial size was severely dilated.   4. The mitral valve is normal in structure. Trivial mitral valve  regurgitation. No evidence of mitral stenosis.   5. Tricuspid valve regurgitation is severe.   6. The aortic valve is tricuspid. There is mild calcification of the  aortic valve. Aortic valve regurgitation is not visualized. Aortic valve  sclerosis/calcification is present, without any evidence of aortic  stenosis. Aortic valve area, by VTI measures   2.61 cm. Aortic valve mean gradient measures 3.0 mmHg. Aortic valve Vmax  measures 1.16 m/s.   7. The inferior vena cava is normal in size with greater than 50%  respiratory variability, suggesting right atrial pressure of 3 mmHg.   FINDINGS   Left Ventricle: Left ventricular ejection fraction, by estimation, is 55  to 60%. The left ventricle has normal function. The left ventricle has no  regional wall motion abnormalities. The left ventricular internal cavity  size was normal in size. There is   no left ventricular hypertrophy. The interventricular septum is flattened  in systole and diastole, consistent with right ventricular pressure and  volume overload. Left ventricular diastolic parameters are indeterminate.   Right Ventricle: The right ventricular size is severely enlarged. No  increase in right  ventricular wall thickness. Right ventricular systolic  function is severely reduced. There is severely elevated pulmonary artery  systolic pressure. The tricuspid  regurgitant velocity is 3.68 m/s, and with an assumed right atrial  pressure of 15 mmHg, the estimated right ventricular systolic pressure is  69.2 mmHg.   Left Atrium: Left atrial size was normal in size.   Right Atrium: Right atrial size was severely dilated.   Pericardium: There is no evidence of pericardial effusion.   Mitral Valve: The mitral valve is normal in structure. Trivial mitral  valve regurgitation. No evidence of mitral valve stenosis.   Tricuspid Valve: The tricuspid valve is normal in structure. Tricuspid  valve regurgitation is severe. No evidence of tricuspid stenosis.   Aortic Valve: The aortic valve is tricuspid. There is mild calcification  of the aortic valve. Aortic valve regurgitation is not visualized. Aortic  valve sclerosis/calcification is present, without any evidence of aortic  stenosis. Aortic valve mean  gradient measures 3.0 mmHg. Aortic valve peak gradient measures 5.4 mmHg.  Aortic valve area, by VTI measures 2.61 cm.   Pulmonic Valve: The pulmonic valve was normal in structure. Pulmonic valve  regurgitation is mild to moderate. No evidence of pulmonic stenosis.   Aorta: The aortic root is normal in size and structure.   Venous: The inferior vena cava is normal in size with greater than 50%  respiratory variability, suggesting right atrial pressure of 3 mmHg.   IAS/Shunts: No atrial level shunt detected by color flow Doppler.   Patient Profile     88 y.o. male   with history of CAD s/p CABG (LIMA-distal LAD, SVG-PDA and OM1)'13, chronic kidney disease stage III/IV, diabetes mellitus, hypertension, hyperlipidemia, COPD/pulmonary fibrosis on 2 L oxygen, sleep apnea on CPAP and iron deficiency anemia presented for evaluation of worsening shortness of breath.  Initially required  CPAP.  Assessment & Plan    Non-STEMI: Troponin trending up 119>318>577>641>>458. EKG with T wave inversion in lead V1 to V4.  Unknown baseline. No chest pain.   -- Poor candidate for cardiac catheterization given elevated renal function and advanced age.  Per wife, previously cardiac catheterization was deferred due to renal function.  -- Continue aspirin 81 mg daily and Plavix 75 mg daily, IV heparin (48 hrs total completed), Imdur   HFpEF with severely reduced RV: BNP 529, Chest x-ray with pulmonary vascular congestion and possible interstitial edema. Past 2 months patient has been dealing with volume exacerbation. His Lasix was changed to torsemide.  Baseline weight 210 pounds.  -- continue on Lasix IV 40 mg twice daily, net -2.8L, weight trending down 222>>215lbs -- echo showed LVEF of 55-60% with severely reduced RV function, severely dilated right atrium, RVSP 69 mmHg. This is new from previously echo report (wife has paper copy) from 10/2021. May be thromboembolic? Considered VQ scan though suspect could be from his long standing COPD -- holding lisinopril and coreg in the setting of hypotension. Wife reports his BPs have been low frequently at home. Medical therapy is limited -- Continue Imdur and Farxiga   Acute on chronic kidney disease stage III/IV:Gradual worsening renal function for past 6 months. Cr 2.9>>2.8>>2.48>>2.35, improving -- holding ACEi -- follow BMET with diuresis   Hyperlipidemia: LDL was 27 last month -- Continue high intensity statin  Per primary COPD- chronically O2 dependent. Wife notes he has declined to see pulmonary in the past DM Anemia Gout  For questions or updates, please contact [facility] Please consult [email or web address] for contact info under        Signed, Laverda Page, NP  01/17/2022, 8:35 AM    I have personally seen and examined this patient. I agree with the assessment and plan as outlined above.  No complaints today. He diuresed  well overnight. Still with mild volume overload. Continue Lasix today. No plans for ischemic evaluation.   Verne Carrow 01/17/2022 9:34 AM

## 2022-01-17 NOTE — Progress Notes (Signed)
Mobility Specialist Progress Note    01/17/22 1517  Mobility  Activity Ambulated with assistance in hallway  Level of Assistance Contact guard assist, steadying assist  Assistive Device Front wheel walker  Distance Ambulated (ft) 350 ft  Activity Response Tolerated well  $Mobility charge 1 Mobility   During Mobility: 118 HR Post-Mobility: 106 HR, 91% SpO2  Pt received in chair and agreeable. C/o some knee pain. Ambulated on 3LO2. Returned to bed with call bell in reach.    [name] Mobility Specialist

## 2022-01-18 DIAGNOSIS — J9621 Acute and chronic respiratory failure with hypoxia: Secondary | ICD-10-CM | POA: Diagnosis not present

## 2022-01-18 DIAGNOSIS — I214 Non-ST elevation (NSTEMI) myocardial infarction: Secondary | ICD-10-CM | POA: Diagnosis not present

## 2022-01-18 DIAGNOSIS — I5023 Acute on chronic systolic (congestive) heart failure: Secondary | ICD-10-CM | POA: Diagnosis not present

## 2022-01-18 LAB — BASIC METABOLIC PANEL
Anion gap: 10 (ref 5–15)
BUN: 28 mg/dL — ABNORMAL HIGH (ref 8–23)
CO2: 27 mmol/L (ref 22–32)
Calcium: 9.5 mg/dL (ref 8.9–10.3)
Chloride: 105 mmol/L (ref 98–111)
Creatinine, Ser: 2 mg/dL — ABNORMAL HIGH (ref 0.61–1.24)
GFR, Estimated: 32 mL/min — ABNORMAL LOW (ref >60)
Glucose, Bld: 132 mg/dL — ABNORMAL HIGH (ref 70–99)
Potassium: 4.1 mmol/L (ref 3.5–5.1)
Sodium: 142 mmol/L (ref 135–145)

## 2022-01-18 LAB — CBC
HCT: 41.1 % (ref 39.0–52.0)
Hemoglobin: 13.3 g/dL (ref 13.0–17.0)
MCH: 25.4 pg — ABNORMAL LOW (ref 26.0–34.0)
MCHC: 32.4 g/dL (ref 30.0–36.0)
MCV: 78.4 fL — ABNORMAL LOW (ref 80.0–100.0)
Platelets: 159 10*3/uL (ref 150–400)
RBC: 5.24 MIL/uL (ref 4.22–5.81)
RDW: 16.9 % — ABNORMAL HIGH (ref 11.5–15.5)
WBC: 11.6 10*3/uL — ABNORMAL HIGH (ref 4.0–10.5)
nRBC: 0 % (ref 0.0–0.2)

## 2022-01-18 LAB — GLUCOSE, CAPILLARY
Glucose-Capillary: 103 mg/dL — ABNORMAL HIGH (ref 70–99)
Glucose-Capillary: 144 mg/dL — ABNORMAL HIGH (ref 70–99)
Glucose-Capillary: 98 mg/dL (ref 70–99)
Glucose-Capillary: 176 mg/dL — ABNORMAL HIGH (ref 70–99)

## 2022-01-18 MED ORDER — FUROSEMIDE 40 MG PO TABS
40.0000 mg | ORAL_TABLET | Freq: Every day | ORAL | Status: DC
  Administered 2022-01-18 – 2022-01-19 (×2): 40 mg via ORAL
  Filled 2022-01-18 (×2): qty 1

## 2022-01-18 MED ORDER — CARVEDILOL 6.25 MG PO TABS
6.2500 mg | ORAL_TABLET | Freq: Two times a day (BID) | ORAL | Status: DC
  Administered 2022-01-18 – 2022-01-19 (×3): 6.25 mg via ORAL
  Filled 2022-01-18 (×3): qty 1

## 2022-01-18 NOTE — Progress Notes (Signed)
PROGRESS NOTE Jacob Silva  ZHY:[id number] DOB: [birth year] DOA: 01/13/2022 PCP: Sherron Monday, MD   Brief Narrative/Hospital Course: 88 y.o. male with medical history significant of CAD status post CABG, CKD 3a, diabetes, gout, hyperlipidemia, hypertension, hypothyroidism, anemia, coagulopathy, COPD on 2L [place] at home - presenting with somewhat sudden onset shortness of breath; although wife and patient admit to more insidious worsening of orthopnea, dyspnea with exertion, and weight gain Patient admitted for worsening shortness of breath initially required CPAP, being treated for non-ST elevation MI seen by cardiology in consult troponin peaked to 641> 458.  Echo showed EF 55 to 60% with severely reduced RV function severely dilated right atrium RVSP 69 mmHg new from previous echo.  With diuresis patient did well, managed with aspirin Plavix heparin x48 hours Imdur and no plan for further ischemic evaluation as per cardiology in the setting of CKD.  GDMT per cardiology      Subjective: Seen examined Still feels weak No new complaints overnight.  Resting comfortably Jacob Silva seen by cardiology this morning  Assessment and Plan: Principal Problem:   Acute on chronic respiratory failure with hypoxia (HCC) Active Problems:   CAD (coronary artery disease)   Diabetes mellitus type II, controlled (HCC)   Gout   HTN (hypertension)   Hypercholesterolemia   Hypothyroidism   Chronic obstructive pulmonary disease, unspecified (HCC)   Acute renal failure superimposed on stage 3b chronic kidney disease (HCC)   CHF exacerbation (HCC)   NSTEMI (non-ST elevated myocardial infarction) (HCC)   Acute on chronic respiratory failure with hypoxia Acute on chronic diastolic CHF exacerbation: Diuresis with IV Lasix at this time volume status is stable.  Echo with EF 55-60%,changing to p.o. Lasix-today resuming Coreg.  Continue Marcelline Deist if creatinine blood pressure remains stable consider resuming ACE  inhibitor tomorrow monitor intake output weight versus much better as below Net IO Since Admission: -5,898.28 mL [01/18/22 1335]  Filed Weights   01/16/22 0500 01/17/22 0648 01/18/22 0612  Weight: 101 kg 97.7 kg 94.5 kg    NSTEMI CAD: Mildly elevated troponin no chest pain poor candidate for cardiac cath given elevated renal function and advanced age.  Per wife previous cardiac cath was deferred due to renal function.  Continue aspirin Plavix and Imdur as per cardiology.  Acute renal failure superimposed on stage 3b chronic kidney disease: Creatinine 2.9 on admission, previous creatinine was from July 2014 1.9.  Creatinine improved to baseline.  Repeat BMP overnight  Diabetes mellitus type II, controlled: A1c 7.4 blood sugar well controlled.  At home on 35 units nightly currently taking 20 units at bedtime and sliding scale insulin.  Recent Labs  Lab 01/15/22 0304 01/15/22 0849 01/17/22 1125 01/17/22 1531 01/17/22 2209 01/18/22 0751 01/18/22 1117  GLUCAP  --    < > 207* 121* 143* 103* 144*  HGBA1C 7.4*  --   --   --   --   --   --    < > = values in this interval not displayed.   Gout: Continue home allopurinol HTN: BP stable resume Coreg 6.25, resume ACE inhibitor in a.m. Hypercholesterolemia: Continue Lipitor 80 mg. Hypothyroidism: Continue home Synthroid Copd not in exacerbation continue Anoro Ellipta continue Leukocytosis:Improving without intervention.    DVT prophylaxis: heparin injection 5,000 Units Start: 01/16/22 1400 Code Status:   Code Status: DNR Family Communication: plan of care discussed with patient at bedside. Patient status is: Inpatient because of ongoing monitoring of renal function blood pressure Level of care: Progressive  Dispo: The patient is from: home.  Obtain PT evaluation today            Anticipated disposition: Anticipate discharge Saturday  Mobility Assessment (last 72 hours)     Mobility Assessment   No documentation.             Objective: Vitals last 24 hrs: Vitals:   01/18/22 0600 01/18/22 0612 01/18/22 0820 01/18/22 0824  BP:  (!) 134/91    Pulse:  95    Resp:      Temp:  97.6 F (36.4 C)    TempSrc:  Oral    SpO2: 97% 93% 94% 94%  Weight:  94.5 kg    Height:       Weight change: -3.13 kg  Physical Examination: General exam: alert awake,older than stated age, weak appearing. HEENT:Oral mucosa moist, Ear/Nose WNL grossly, dentition normal. Respiratory system: bilaterally clear w/ crackles in bilateral lungs in the bases,no use of accessory muscle Cardiovascular system: S1 & S2 +, No JVD. Gastrointestinal system: Abdomen soft,NT,ND, BS+ Nervous System:Alert, awake, moving extremities and grossly nonfocal Extremities: LE edema neg,distal peripheral pulses palpable.  Skin: No rashes,no icterus. MSK: Normal muscle bulk,tone, power  Medications reviewed:  Scheduled Meds:  allopurinol  100 mg Oral Daily   arformoterol  15 mcg Nebulization Q12H   aspirin EC  81 mg Oral Daily   atorvastatin  80 mg Oral Daily   carvedilol  6.25 mg Oral BID WC   clopidogrel  75 mg Oral Daily   dapagliflozin propanediol  10 mg Oral Daily   feeding supplement (GLUCERNA SHAKE)  237 mL Oral TID BM   furosemide  40 mg Oral Daily   gabapentin  400 mg Oral BID   heparin injection (subcutaneous)  5,000 Units Subcutaneous Q8H   insulin aspart  0-5 Units Subcutaneous QHS   insulin aspart  0-9 Units Subcutaneous TID WC   insulin glargine-yfgn  20 Units Subcutaneous QHS   isosorbide mononitrate  30 mg Oral Daily   levothyroxine  100 mcg Oral q morning   multivitamin with minerals  1 tablet Oral Daily   pantoprazole  40 mg Oral Daily   sodium chloride flush  3 mL Intravenous Q12H   umeclidinium-vilanterol  1 puff Inhalation Daily   Continuous Infusions:    Diet Order             Diet Carb Modified Fluid consistency: Thin; Room service appropriate? Yes  Diet effective now                    Nutrition Problem:  Increased nutrient needs Etiology: chronic illness (CHF, COPD) Signs/Symptoms: estimated needs Interventions: Refer to RD note for recommendations   Intake/Output Summary (Last 24 hours) at 01/18/2022 0944 Last data filed at 01/18/2022 0540 Gross per 24 hour  Intake 150 ml  Output 3200 ml  Net -3050 ml   Net IO Since Admission: -5,898.28 mL [01/18/22 0944]  Wt Readings from Last 3 Encounters:  01/18/22 94.5 kg  07/21/17 107.5 kg  10/11/13 109.8 kg     Unresulted Labs (From admission, onward)     Start     Ordered   01/15/22 0500  CBC  Daily,   R      01/14/22 0428   01/15/22 0500  Basic metabolic panel  Daily,   R     Question:  Specimen collection method  Answer:  Lab=Lab collect   01/14/22 0900  Data Reviewed: I have personally reviewed following labs and imaging studies CBC: Recent Labs  Lab 01/14/22 0317 01/15/22 0304 01/16/22 0155 01/17/22 0100 01/18/22 0127  WBC 9.5 12.4* 11.8* 11.7* 11.6*  HGB 13.3 12.2* 12.4* 12.4* 13.3  HCT 39.5 36.9* 38.0* 38.3* 41.1  MCV 78.2* 79.9* 79.0* 79.3* 78.4*  PLT 164 161 153 152 159   Basic Metabolic Panel: Recent Labs  Lab 01/13/22 2056 01/14/22 0317 01/15/22 0304 01/16/22 0155 01/17/22 0100 01/18/22 0127  NA  --  142 142 142 139 142  K  --  4.8 4.5 4.2 4.1 4.1  CL  --  105 106 106 101 105  CO2  --  25 28 28 29 27   GLUCOSE  --  201* 116* 151* 139* 132*  BUN  --  39* 44* 40* 33* 28*  CREATININE  --  2.93* 2.88* 2.48* 2.35* 2.00*  CALCIUM  --  8.9 8.7* 9.1 9.0 9.5  MG 1.8  --   --   --   --   --    GFR: Estimated Creatinine Clearance: 30 mL/min (A) (by C-G formula based on SCr of 2 mg/dL (H)). Liver Function Tests: Recent Labs  Lab 01/13/22 1552 01/14/22 0317  AST 51* 31  ALT 38 35  ALKPHOS 114 127*  BILITOT 0.7 0.3  PROT 5.7* 6.3*  ALBUMIN 3.1* 3.2*   No results for input(s): "LIPASE", "AMYLASE" in the last 168 hours. No results for input(s): "AMMONIA" in the last 168 hours. Coagulation  Profile: Recent Labs  Lab 01/13/22 1552  INR 1.3*   BNP (last 3 results) No results for input(s): "PROBNP" in the last 8760 hours. HbA1C: No results for input(s): "HGBA1C" in the last 72 hours. CBG: Recent Labs  Lab 01/16/22 2132 01/17/22 0733 01/17/22 1125 01/17/22 1531 01/17/22 2209  GLUCAP 241* 110* 207* 121* 143*   Lipid Profile: No results for input(s): "CHOL", "HDL", "LDLCALC", "TRIG", "CHOLHDL", "LDLDIRECT" in the last 72 hours. Thyroid Function Tests: No results for input(s): "TSH", "T4TOTAL", "FREET4", "T3FREE", "THYROIDAB" in the last 72 hours. Sepsis Labs: No results for input(s): "PROCALCITON", "LATICACIDVEN" in the last 168 hours.  Recent Results (from the past 240 hour(s))  SARS Coronavirus 2 by RT PCR (hospital order, performed in [facility] hospital lab) *cepheid single result test* Anterior Nasal Swab     Status: None   Collection Time: 01/13/22  3:41 PM   Specimen: Anterior Nasal Swab  Result Value Ref Range Status   SARS Coronavirus 2 by RT PCR NEGATIVE NEGATIVE Final    Comment: (NOTE) SARS-CoV-2 target nucleic acids are NOT DETECTED.  The SARS-CoV-2 RNA is generally detectable in upper and lower respiratory specimens during the acute phase of infection. The lowest concentration of SARS-CoV-2 viral copies this assay can detect is 250 copies / mL. A negative result does not preclude SARS-CoV-2 infection and should not be used as the sole basis for treatment or other patient management decisions.  A negative result may occur with improper specimen collection / handling, submission of specimen other than nasopharyngeal swab, presence of viral mutation(s) within the areas targeted by this assay, and inadequate number of viral copies (<250 copies / mL). A negative result must be combined with clinical observations, patient history, and epidemiological information.  Fact Sheet for Patients:   RoadLapTop.co.za  Fact Sheet for  Healthcare Providers: http://kim-miller.com/  This test is not yet approved or  cleared by the [place] FDA and has been authorized for detection and/or diagnosis of SARS-CoV-2  by FDA under an Emergency Use Authorization (EUA).  This EUA will remain in effect (meaning this test can be used) for the duration of the COVID-19 declaration under Section 564(b)(1) of the Act, 21 U.S.C. section 360bbb-3(b)(1), unless the authorization is terminated or revoked sooner.  Performed at [facility], [address] [place], [place] [address]     Antimicrobials: Anti-infectives (From admission, onward)    None      Culture/Microbiology No results found for: "SDES", "SPECREQUEST", "CULT", "REPTSTATUS"    Radiology Studies: No results found.   LOS: 5 days   Lanae Boast, MD [facility]  01/18/2022, 9:44 AM

## 2022-01-19 DIAGNOSIS — I5033 Acute on chronic diastolic (congestive) heart failure: Secondary | ICD-10-CM | POA: Diagnosis not present

## 2022-01-19 DIAGNOSIS — I214 Non-ST elevation (NSTEMI) myocardial infarction: Secondary | ICD-10-CM | POA: Diagnosis not present

## 2022-01-19 DIAGNOSIS — J9621 Acute and chronic respiratory failure with hypoxia: Secondary | ICD-10-CM | POA: Diagnosis not present

## 2022-01-19 LAB — CBC
HCT: 39 % (ref 39.0–52.0)
Hemoglobin: 12.9 g/dL — ABNORMAL LOW (ref 13.0–17.0)
MCH: 26 pg (ref 26.0–34.0)
MCHC: 33.1 g/dL (ref 30.0–36.0)
MCV: 78.6 fL — ABNORMAL LOW (ref 80.0–100.0)
Platelets: 151 10*3/uL (ref 150–400)
RBC: 4.96 MIL/uL (ref 4.22–5.81)
RDW: 16.8 % — ABNORMAL HIGH (ref 11.5–15.5)
WBC: 11.8 10*3/uL — ABNORMAL HIGH (ref 4.0–10.5)
nRBC: 0 % (ref 0.0–0.2)

## 2022-01-19 LAB — BASIC METABOLIC PANEL
Anion gap: 8 (ref 5–15)
BUN: 28 mg/dL — ABNORMAL HIGH (ref 8–23)
CO2: 27 mmol/L (ref 22–32)
Calcium: 9 mg/dL (ref 8.9–10.3)
Chloride: 105 mmol/L (ref 98–111)
Creatinine, Ser: 2.07 mg/dL — ABNORMAL HIGH (ref 0.61–1.24)
GFR, Estimated: 30 mL/min — ABNORMAL LOW (ref >60)
Glucose, Bld: 172 mg/dL — ABNORMAL HIGH (ref 70–99)
Potassium: 4.4 mmol/L (ref 3.5–5.1)
Sodium: 140 mmol/L (ref 135–145)

## 2022-01-19 LAB — GLUCOSE, CAPILLARY: Glucose-Capillary: 125 mg/dL — ABNORMAL HIGH (ref 70–99)

## 2022-01-19 MED ORDER — BENAZEPRIL HCL 5 MG PO TABS
10.0000 mg | ORAL_TABLET | Freq: Every day | ORAL | Status: DC
  Filled 2022-01-19: qty 2

## 2022-01-19 MED ORDER — FUROSEMIDE 40 MG PO TABS: 40.0000 mg | ORAL_TABLET | Freq: Every day | ORAL | 1 refills | Status: DC

## 2022-01-24 ENCOUNTER — Ambulatory Visit: Payer: Medicare Other | Admitting: Podiatry

## 2022-02-07 ENCOUNTER — Ambulatory Visit (INDEPENDENT_AMBULATORY_CARE_PROVIDER_SITE_OTHER): Payer: No Typology Code available for payment source | Admitting: Podiatry

## 2022-02-07 ENCOUNTER — Encounter: Payer: Self-pay | Admitting: Podiatry

## 2022-02-07 DIAGNOSIS — B351 Tinea unguium: Secondary | ICD-10-CM | POA: Diagnosis not present

## 2022-02-07 DIAGNOSIS — D689 Coagulation defect, unspecified: Secondary | ICD-10-CM

## 2022-02-07 DIAGNOSIS — M79674 Pain in right toe(s): Secondary | ICD-10-CM

## 2022-02-07 DIAGNOSIS — M79675 Pain in left toe(s): Secondary | ICD-10-CM

## 2022-02-07 DIAGNOSIS — E1142 Type 2 diabetes mellitus with diabetic polyneuropathy: Secondary | ICD-10-CM

## 2022-02-07 NOTE — Progress Notes (Signed)
This patient presents the office for an at risk foot care.  Patient requires his today by a professional for this patient will be at risk.  Patient is at risk due to coagulation disorder ESRD and DM.  Patient is presently taking Plavix.    He says he is unable to trim his nails since he cannot reach his nails.  He presents the office today for at risk  foot care.  Patient is taking plavix.   General Appearance  Alert, conversant and in no acute stress.  Vascular  Dorsalis pedis and posterior tibial  pulses are palpable  bilaterally.  Capillary return is within normal limits  bilaterally. Temperature is within normal limits  bilaterally.  Neurologic  Senn-Weinstein monofilament wire test within normal limits right foot.  Absent  LOPS left foot. Muscle power within normal limits bilaterally.  Nails Thick disfigured discolored nails with subungual debris  from second to fifth toes bilaterally. No evidence of bacterial infection or drainage bilaterally.  Orthopedic  No limitations of motion  feet .  No crepitus or effusions noted.  No bony pathology or digital deformities noted.  Skin  normotropic skin with no porokeratosis noted bilaterally.  No signs of infections or ulcers noted.    Onychomycosis  B/L.  Pain in toes  B/L.     Debridement and grinding of long thick painful nails.   Debride nails with nail nipper followed by dremel tool.Told this patient the importance of periodic foot evaluation this will help reduce the potential complications of his feet.  RTC 3 months.   Gardiner Barefoot DPM

## 2022-02-14 ENCOUNTER — Observation Stay (HOSPITAL_COMMUNITY): Payer: Medicare Other

## 2022-02-14 ENCOUNTER — Inpatient Hospital Stay (HOSPITAL_COMMUNITY)
Admission: EM | Admit: 2022-02-14 | Discharge: 2022-02-19 | DRG: 291 | Disposition: A | Discharge status: Home Health | Payer: Medicare Other | Attending: Family Medicine | Admitting: Family Medicine

## 2022-02-14 ENCOUNTER — Encounter (HOSPITAL_COMMUNITY): Payer: Self-pay

## 2022-02-14 ENCOUNTER — Other Ambulatory Visit: Payer: Self-pay

## 2022-02-14 ENCOUNTER — Emergency Department (HOSPITAL_COMMUNITY): Payer: Medicare Other

## 2022-02-14 DIAGNOSIS — J9621 Acute and chronic respiratory failure with hypoxia: Secondary | ICD-10-CM | POA: Diagnosis present

## 2022-02-14 DIAGNOSIS — N1832 Chronic kidney disease, stage 3b: Secondary | ICD-10-CM | POA: Diagnosis present

## 2022-02-14 DIAGNOSIS — Z79890 Hormone replacement therapy (postmenopausal): Secondary | ICD-10-CM

## 2022-02-14 DIAGNOSIS — Z7982 Long term (current) use of aspirin: Secondary | ICD-10-CM

## 2022-02-14 DIAGNOSIS — Z79899 Other long term (current) drug therapy: Secondary | ICD-10-CM

## 2022-02-14 DIAGNOSIS — I440 Atrioventricular block, first degree: Secondary | ICD-10-CM | POA: Diagnosis present

## 2022-02-14 DIAGNOSIS — Z951 Presence of aortocoronary bypass graft: Secondary | ICD-10-CM

## 2022-02-14 DIAGNOSIS — I509 Heart failure, unspecified: Secondary | ICD-10-CM

## 2022-02-14 DIAGNOSIS — Z66 Do not resuscitate: Secondary | ICD-10-CM

## 2022-02-14 DIAGNOSIS — E785 Hyperlipidemia, unspecified: Secondary | ICD-10-CM | POA: Diagnosis present

## 2022-02-14 DIAGNOSIS — I252 Old myocardial infarction: Secondary | ICD-10-CM

## 2022-02-14 DIAGNOSIS — E1122 Type 2 diabetes mellitus with diabetic chronic kidney disease: Secondary | ICD-10-CM | POA: Diagnosis present

## 2022-02-14 DIAGNOSIS — D509 Iron deficiency anemia, unspecified: Secondary | ICD-10-CM | POA: Diagnosis present

## 2022-02-14 DIAGNOSIS — G4733 Obstructive sleep apnea (adult) (pediatric): Secondary | ICD-10-CM | POA: Diagnosis present

## 2022-02-14 DIAGNOSIS — Z885 Allergy status to narcotic agent status: Secondary | ICD-10-CM

## 2022-02-14 DIAGNOSIS — I2729 Other secondary pulmonary hypertension: Secondary | ICD-10-CM | POA: Diagnosis present

## 2022-02-14 DIAGNOSIS — N179 Acute kidney failure, unspecified: Secondary | ICD-10-CM | POA: Diagnosis not present

## 2022-02-14 DIAGNOSIS — E119 Type 2 diabetes mellitus without complications: Secondary | ICD-10-CM

## 2022-02-14 DIAGNOSIS — I2721 Secondary pulmonary arterial hypertension: Secondary | ICD-10-CM | POA: Diagnosis present

## 2022-02-14 DIAGNOSIS — I130 Hypertensive heart and chronic kidney disease with heart failure and stage 1 through stage 4 chronic kidney disease, or unspecified chronic kidney disease: Principal | ICD-10-CM | POA: Diagnosis present

## 2022-02-14 DIAGNOSIS — Z880 Allergy status to penicillin: Secondary | ICD-10-CM

## 2022-02-14 DIAGNOSIS — I5043 Acute on chronic combined systolic (congestive) and diastolic (congestive) heart failure: Secondary | ICD-10-CM | POA: Diagnosis present

## 2022-02-14 DIAGNOSIS — E039 Hypothyroidism, unspecified: Secondary | ICD-10-CM | POA: Diagnosis present

## 2022-02-14 DIAGNOSIS — J449 Chronic obstructive pulmonary disease, unspecified: Secondary | ICD-10-CM | POA: Diagnosis present

## 2022-02-14 DIAGNOSIS — I10 Essential (primary) hypertension: Secondary | ICD-10-CM | POA: Diagnosis present

## 2022-02-14 DIAGNOSIS — I5082 Biventricular heart failure: Secondary | ICD-10-CM | POA: Diagnosis not present

## 2022-02-14 DIAGNOSIS — I5020 Unspecified systolic (congestive) heart failure: Secondary | ICD-10-CM

## 2022-02-14 DIAGNOSIS — I2510 Atherosclerotic heart disease of native coronary artery without angina pectoris: Secondary | ICD-10-CM | POA: Diagnosis present

## 2022-02-14 HISTORY — DX: Chronic diastolic (congestive) heart failure: I50.32

## 2022-02-14 HISTORY — DX: Pulmonary fibrosis, unspecified: J84.10

## 2022-02-14 HISTORY — DX: Hyperlipidemia, unspecified: E78.5

## 2022-02-14 HISTORY — DX: Chronic obstructive pulmonary disease, unspecified: J44.9

## 2022-02-14 HISTORY — DX: Chronic kidney disease, stage 4 (severe): N18.4

## 2022-02-14 HISTORY — DX: Essential (primary) hypertension: I10

## 2022-02-14 HISTORY — DX: Atherosclerotic heart disease of native coronary artery without angina pectoris: I25.10

## 2022-02-14 HISTORY — DX: Obstructive sleep apnea (adult) (pediatric): G47.33

## 2022-02-14 LAB — URINALYSIS, ROUTINE W REFLEX MICROSCOPIC
Bilirubin Urine: NEGATIVE
Glucose, UA: 150 mg/dL — AB
Hgb urine dipstick: NEGATIVE
Ketones, ur: NEGATIVE mg/dL
Leukocytes,Ua: NEGATIVE
Nitrite: NEGATIVE
Protein, ur: NEGATIVE mg/dL
Specific Gravity, Urine: 1.005 (ref 1.005–1.030)
pH: 6 (ref 5.0–8.0)

## 2022-02-14 LAB — COMPREHENSIVE METABOLIC PANEL
ALT: 22 U/L (ref 0–44)
AST: 16 U/L (ref 15–41)
Albumin: 3 g/dL — ABNORMAL LOW (ref 3.5–5.0)
Alkaline Phosphatase: 117 U/L (ref 38–126)
Anion gap: 11 (ref 5–15)
BUN: 33 mg/dL — ABNORMAL HIGH (ref 8–23)
CO2: 26 mmol/L (ref 22–32)
Calcium: 8.8 mg/dL — ABNORMAL LOW (ref 8.9–10.3)
Chloride: 103 mmol/L (ref 98–111)
Creatinine, Ser: 2.93 mg/dL — ABNORMAL HIGH (ref 0.61–1.24)
GFR, Estimated: 20 mL/min — ABNORMAL LOW (ref >60)
Glucose, Bld: 127 mg/dL — ABNORMAL HIGH (ref 70–99)
Potassium: 4.6 mmol/L (ref 3.5–5.1)
Sodium: 140 mmol/L (ref 135–145)
Total Bilirubin: 0.9 mg/dL (ref 0.3–1.2)
Total Protein: 5.8 g/dL — ABNORMAL LOW (ref 6.5–8.1)

## 2022-02-14 LAB — CBC WITH DIFFERENTIAL/PLATELET
Abs Immature Granulocytes: 0.03 10*3/uL (ref 0.00–0.07)
Basophils Absolute: 0 10*3/uL (ref 0.0–0.1)
Basophils Relative: 0 %
Eosinophils Absolute: 0.7 10*3/uL — ABNORMAL HIGH (ref 0.0–0.5)
Eosinophils Relative: 6 %
HCT: 36.1 % — ABNORMAL LOW (ref 39.0–52.0)
Hemoglobin: 11.7 g/dL — ABNORMAL LOW (ref 13.0–17.0)
Immature Granulocytes: 0 %
Lymphocytes Relative: 12 %
Lymphs Abs: 1.2 10*3/uL (ref 0.7–4.0)
MCH: 25.8 pg — ABNORMAL LOW (ref 26.0–34.0)
MCHC: 32.4 g/dL (ref 30.0–36.0)
MCV: 79.5 fL — ABNORMAL LOW (ref 80.0–100.0)
Monocytes Absolute: 0.6 10*3/uL (ref 0.1–1.0)
Monocytes Relative: 6 %
Neutro Abs: 7.9 10*3/uL — ABNORMAL HIGH (ref 1.7–7.7)
Neutrophils Relative %: 76 %
Platelets: 149 10*3/uL — ABNORMAL LOW (ref 150–400)
RBC: 4.54 MIL/uL (ref 4.22–5.81)
RDW: 18.1 % — ABNORMAL HIGH (ref 11.5–15.5)
WBC: 10.4 10*3/uL (ref 4.0–10.5)
nRBC: 0 % (ref 0.0–0.2)

## 2022-02-14 LAB — MAGNESIUM: Magnesium: 2 mg/dL (ref 1.7–2.4)

## 2022-02-14 LAB — CBG MONITORING, ED: Glucose-Capillary: 98 mg/dL (ref 70–99)

## 2022-02-14 LAB — BRAIN NATRIURETIC PEPTIDE: B Natriuretic Peptide: 472.4 pg/mL — ABNORMAL HIGH (ref 0.0–100.0)

## 2022-02-14 MED ORDER — ONDANSETRON HCL 4 MG PO TABS: 4.0000 mg | ORAL_TABLET | Freq: Four times a day (QID) | ORAL | Status: DC | PRN

## 2022-02-14 MED ORDER — LEVOTHYROXINE SODIUM 100 MCG PO TABS
100.0000 ug | ORAL_TABLET | Freq: Every morning | ORAL | Status: DC
  Administered 2022-02-15 – 2022-02-19 (×5): 100 ug via ORAL
  Filled 2022-02-14 (×5): qty 1

## 2022-02-14 MED ORDER — ASPIRIN 81 MG PO TBEC
81.0000 mg | DELAYED_RELEASE_TABLET | Freq: Every day | ORAL | Status: DC
  Administered 2022-02-14 – 2022-02-19 (×6): 81 mg via ORAL
  Filled 2022-02-14 (×6): qty 1

## 2022-02-14 MED ORDER — LEVOTHYROXINE SODIUM 100 MCG PO TABS: 100.0000 ug | ORAL_TABLET | Freq: Every morning | ORAL | Status: DC

## 2022-02-14 MED ORDER — FUROSEMIDE 10 MG/ML IJ SOLN
60.0000 mg | Freq: Once | INTRAMUSCULAR | Status: AC
  Administered 2022-02-14: 60 mg via INTRAVENOUS
  Filled 2022-02-14: qty 6

## 2022-02-14 MED ORDER — INSULIN ASPART 100 UNIT/ML IJ SOLN
0.0000 [IU] | Freq: Three times a day (TID) | INTRAMUSCULAR | Status: DC
  Administered 2022-02-15: 2 [IU] via SUBCUTANEOUS
  Administered 2022-02-16: 1 [IU] via SUBCUTANEOUS
  Administered 2022-02-16: 2 [IU] via SUBCUTANEOUS
  Administered 2022-02-17 – 2022-02-19 (×3): 1 [IU] via SUBCUTANEOUS

## 2022-02-14 MED ORDER — CARVEDILOL 6.25 MG PO TABS
6.2500 mg | ORAL_TABLET | Freq: Two times a day (BID) | ORAL | Status: DC
  Administered 2022-02-15 – 2022-02-19 (×9): 6.25 mg via ORAL
  Filled 2022-02-14 (×3): qty 1
  Filled 2022-02-14: qty 2
  Filled 2022-02-14 (×5): qty 1

## 2022-02-14 MED ORDER — FUROSEMIDE 10 MG/ML IJ SOLN: 40.0000 mg | Freq: Once | INTRAMUSCULAR | Status: DC

## 2022-02-14 MED ORDER — ACETAZOLAMIDE 250 MG PO TABS
250.0000 mg | ORAL_TABLET | Freq: Two times a day (BID) | ORAL | Status: DC
  Administered 2022-02-15 – 2022-02-16 (×3): 250 mg via ORAL
  Filled 2022-02-14 (×4): qty 1

## 2022-02-14 MED ORDER — FUROSEMIDE 10 MG/ML IJ SOLN
60.0000 mg | Freq: Two times a day (BID) | INTRAMUSCULAR | Status: DC
  Administered 2022-02-15 – 2022-02-18 (×8): 60 mg via INTRAVENOUS
  Filled 2022-02-14 (×8): qty 6

## 2022-02-14 MED ORDER — ACETAMINOPHEN 650 MG RE SUPP: 650.0000 mg | Freq: Four times a day (QID) | RECTAL | Status: DC | PRN

## 2022-02-14 MED ORDER — ACETAMINOPHEN 325 MG PO TABS
650.0000 mg | ORAL_TABLET | Freq: Four times a day (QID) | ORAL | Status: DC | PRN
  Administered 2022-02-18 – 2022-02-19 (×2): 650 mg via ORAL
  Filled 2022-02-14 (×2): qty 2

## 2022-02-14 MED ORDER — INSULIN GLARGINE-YFGN 100 UNIT/ML ~~LOC~~ SOLN
10.0000 [IU] | Freq: Every day | SUBCUTANEOUS | Status: DC
Start: 2022-02-15 — End: 2022-02-20
  Administered 2022-02-15 – 2022-02-18 (×4): 10 [IU] via SUBCUTANEOUS
  Filled 2022-02-14 (×5): qty 0.1

## 2022-02-14 MED ORDER — ATORVASTATIN CALCIUM 40 MG PO TABS
40.0000 mg | ORAL_TABLET | Freq: Every day | ORAL | Status: DC
  Administered 2022-02-14 – 2022-02-18 (×5): 40 mg via ORAL
  Filled 2022-02-14 (×5): qty 1

## 2022-02-14 MED ORDER — ISOSORBIDE MONONITRATE ER 30 MG PO TB24
30.0000 mg | ORAL_TABLET | Freq: Every day | ORAL | Status: DC
  Administered 2022-02-14 – 2022-02-19 (×6): 30 mg via ORAL
  Filled 2022-02-14 (×6): qty 1

## 2022-02-14 MED ORDER — ONDANSETRON HCL 4 MG/2ML IJ SOLN
4.0000 mg | Freq: Four times a day (QID) | INTRAMUSCULAR | Status: DC | PRN
  Administered 2022-02-19: 4 mg via INTRAVENOUS
  Filled 2022-02-14: qty 2

## 2022-02-14 MED ORDER — DAPAGLIFLOZIN PROPANEDIOL 10 MG PO TABS
10.0000 mg | ORAL_TABLET | Freq: Every day | ORAL | Status: DC
  Administered 2022-02-14 – 2022-02-15 (×2): 10 mg via ORAL
  Filled 2022-02-14 (×2): qty 1

## 2022-02-14 MED ORDER — IVABRADINE HCL 5 MG PO TABS
5.0000 mg | ORAL_TABLET | Freq: Two times a day (BID) | ORAL | Status: DC
Start: 2022-02-14 — End: 2022-02-15
  Administered 2022-02-14 – 2022-02-15 (×2): 5 mg via ORAL
  Filled 2022-02-14 (×3): qty 1

## 2022-02-14 MED ORDER — INSULIN ASPART 100 UNIT/ML IJ SOLN: 0.0000 [IU] | Freq: Every day | INTRAMUSCULAR | Status: DC

## 2022-02-14 MED ORDER — HEPARIN SODIUM (PORCINE) 5000 UNIT/ML IJ SOLN
5000.0000 [IU] | Freq: Three times a day (TID) | INTRAMUSCULAR | Status: DC
  Administered 2022-02-14 – 2022-02-19 (×14): 5000 [IU] via SUBCUTANEOUS
  Filled 2022-02-14 (×14): qty 1

## 2022-02-14 MED ORDER — OMEPRAZOLE 20 MG PO CPDR
20.0000 mg | DELAYED_RELEASE_CAPSULE | Freq: Every day | ORAL | Status: DC
  Administered 2022-02-14 – 2022-02-19 (×6): 20 mg via ORAL
  Filled 2022-02-14 (×6): qty 1

## 2022-02-14 NOTE — Assessment & Plan Note (Signed)
Acute worsened due to CHF exacerbation.

## 2022-02-14 NOTE — Assessment & Plan Note (Signed)
Verified with pt and wife(kathy, HCPOA). She verifies that pt is a DNR. She states she has living will at home and will bring it tomorrow for it to be copied and scanned into system.

## 2022-02-14 NOTE — Assessment & Plan Note (Signed)
Pt with reduced RV systolic function and enlarged RV diameter, along with RV systolic pressures approaching systemic pressures. Pt has pulmonary artery hypertension.

## 2022-02-14 NOTE — Assessment & Plan Note (Signed)
Add SSI. Continue with reduced dose of lantus due to AKI.

## 2022-02-14 NOTE — ED Provider Notes (Signed)
[facility] EMERGENCY DEPARTMENT Provider Note   CSN: [id number] Arrival date & time: 02/14/22  1233     History  Chief Complaint  Patient presents with   Leg Swelling   Chest Pain   Shortness of Breath    Jacob Silva is a 88 y.o. male history of heart failure, here presenting with shortness of breath and leg swelling.  Patient was just admitted for heart failure.  Patient was discharged home with 20 mg of Lasix twice daily.  Patient Saw primary care doctor last week for leg swelling and Lasix was increased to 40 mg in the morning and 20 mg at night.  Patient states that despite that, he has worsening leg swelling and also felt very short of breath.  He wears 2 L nasal cannula at baseline and felt very short of breath with minimal exertion.  Patient also wakes up at night with shortness of breath.  Patient saw his cardiologist and is supposed to get a nuclear stress test tomorrow.  Denies any fevers or cough.  Patient also gained about 20 pounds over the last week or so  The history is provided by the patient.       Home Medications Prior to Admission medications   Medication Sig Start Date End Date Taking? Authorizing Provider  acetaminophen (TYLENOL) 325 MG tablet Take 650 mg by mouth every 6 (six) hours as needed for mild pain.    [provider]  albuterol (VENTOLIN HFA) 108 (90 Base) MCG/ACT inhaler  10/12/13   [provider]  Alcohol Swabs (ALCOHOL WIPES) 70 % PADS IV Prep Wipes medicated    [provider]  allopurinol (ZYLOPRIM) 100 MG tablet Take 100 mg by mouth daily. 02/13/21   [provider]  aspirin EC 81 MG tablet Take 81 mg by mouth daily.    [provider]  atorvastatin (LIPITOR) 80 MG tablet Take 40 mg by mouth at bedtime.    [provider]  BD PEN NEEDLE MICRO U/F 32G X 6 MM MISC SMARTSIG:1 Pre-Filled Pen Syringe SUB-Q Every Night 05/13/19   [provider]  benazepril (LOTENSIN)  10 MG tablet Take 10 mg by mouth every evening. 03/01/20   [provider]  Blood Glucose Monitoring Suppl (FREESTYLE LITE) DEVI 2 (two) times daily. for testing 02/16/15   [provider]  carvedilol (COREG) 6.25 MG tablet Take 6.25 mg by mouth 2 (two) times daily with a meal.    [provider]  clopidogrel (PLAVIX) 75 MG tablet Take 75 mg by mouth daily.    [provider]  CORLANOR 5 MG TABS tablet Take 5 mg by mouth 2 (two) times daily. 12/20/21   [provider]  dapagliflozin propanediol (FARXIGA) 10 MG TABS tablet Take 10 mg by mouth daily. 03/14/21   [provider]  diclofenac Sodium (VOLTAREN) 1 % GEL Apply 2 g topically 4 (four) times daily as needed (pain). 02/05/21   [provider]  formoterol (PERFOROMIST) 20 MCG/2ML nebulizer solution Take 20 mcg by nebulization 2 (two) times daily.    [provider]  FREESTYLE LITE test strip TEST AS DIRECTED TWICE A DAY 02/16/15   [provider]  furosemide (LASIX) 20 MG tablet Take 20 mg by mouth daily.    [provider]  furosemide (LASIX) 40 MG tablet Take 1 tablet (40 mg total) by mouth daily. 01/20/22   Pahwani, Michell Heinrich, MD  gabapentin (NEURONTIN) 400 MG capsule Take 800 mg  by mouth 2 (two) times daily.    [provider]  isosorbide mononitrate (IMDUR) 30 MG 24 hr tablet Take 30 mg by mouth daily.    [provider]  ketoconazole (NIZORAL) 2 % cream Apply 1 application. topically daily. 10/24/21   McDonald, Stephan Minister, DPM  Lancets (FREESTYLE) lancets TEST AS DIRECTED TWICE A DAY 02/16/15   [provider]  LANTUS SOLOSTAR 100 UNIT/ML Solostar Pen Inject 35 Units into the skin at bedtime. 10/07/15   [provider]  levothyroxine (SYNTHROID) 100 MCG tablet Take 100 mcg by mouth every morning. 12/11/20   [provider]  loperamide (IMODIUM) 2 MG capsule Take 2 mg by mouth daily as needed for diarrhea or loose stools.  03/13/20   [provider]  loratadine (CLARITIN) 10 MG tablet Take 10 mg by mouth daily as needed for allergies.    [provider]  omeprazole (PRILOSEC OTC) 20 MG tablet Take 20 mg by mouth daily.    [provider]  OXYGEN Inhale 2 L into the lungs continuous.    [provider]  potassium chloride SA (K-DUR,KLOR-CON) 20 MEQ tablet potassium chloride ER 20 mEq tablet,extended release(part/cryst) Patient not taking: Reported on 01/14/2022    [provider]  Propylene Glycol (SYSTANE BALANCE) 0.6 % SOLN Place 1 drop into both eyes daily as needed (dry eyes).    [provider]  umeclidinium-vilanterol (ANORO ELLIPTA) 62.5-25 MCG/INH AEPB Inhale 1 puff into the lungs daily.    [provider]      Allergies    Penicillin g, Penicillins, and Tramadol    Review of Systems   Review of Systems  Respiratory:  Positive for shortness of breath.   Cardiovascular:  Positive for chest pain.  All other systems reviewed and are negative.   Physical Exam Updated Vital Signs BP (!) 115/55 (BP Location: Right Arm)   Pulse 64   Temp 98.3 F (36.8 C) (Oral)   Resp 18   Ht 5\' 11"  (1.803 m)   Wt 104.8 kg   SpO2 100%   BMI 32.22 kg/m  Physical Exam Vitals and nursing note reviewed.  Constitutional:      Comments: Chronically ill, tachypneic  HENT:     Head: Normocephalic.  Eyes:     Extraocular Movements: Extraocular movements intact.     Pupils: Pupils are equal, round, and reactive to light.  Cardiovascular:     Rate and Rhythm: Normal rate and regular rhythm.     Heart sounds: Normal heart sounds.  Pulmonary:     Comments: Tachypneic, crackles bilateral bases Abdominal:     General: Bowel sounds are normal.     Palpations: Abdomen is soft.  Musculoskeletal:     Cervical back: Normal range of motion and neck supple.     Comments: 2+ edema bilateral legs  Skin:    General: Skin is warm.     Capillary Refill: Capillary  refill takes less than 2 seconds.  Neurological:     General: No focal deficit present.     Mental Status: He is oriented to person, place, and time.  Psychiatric:        Mood and Affect: Mood normal.        Behavior: Behavior normal.     ED Results / Procedures / Treatments   Labs (all labs ordered are listed, but only abnormal results are displayed) Labs Reviewed  CBC WITH DIFFERENTIAL/PLATELET - Abnormal; Notable for the following components:  Result Value   Hemoglobin 11.7 (*)    HCT 36.1 (*)    MCV 79.5 (*)    MCH 25.8 (*)    RDW 18.1 (*)    Platelets 149 (*)    Neutro Abs 7.9 (*)    Eosinophils Absolute 0.7 (*)    All other components within normal limits  COMPREHENSIVE METABOLIC PANEL - Abnormal; Notable for the following components:   Glucose, Bld 127 (*)    BUN 33 (*)    Creatinine, Ser 2.93 (*)    Calcium 8.8 (*)    Total Protein 5.8 (*)    Albumin 3.0 (*)    GFR, Estimated 20 (*)    All other components within normal limits  BRAIN NATRIURETIC PEPTIDE - Abnormal; Notable for the following components:   B Natriuretic Peptide 472.4 (*)    All other components within normal limits  MAGNESIUM  URINALYSIS, ROUTINE W REFLEX MICROSCOPIC  COMPREHENSIVE METABOLIC PANEL  MAGNESIUM  CBC WITH DIFFERENTIAL/PLATELET    EKG EKG Interpretation  Date/Time:  Thursday February 14 2022 12:52:41 EDT Ventricular Rate:  78 PR Interval:    QRS Duration: 94 QT Interval:  410 QTC Calculation: 467 R Axis:   172 Text Interpretation: Atrial fibrillation with a competing junctional pacemaker Incomplete right bundle branch block Right ventricular hypertrophy with repolarization abnormality Possible Lateral infarct , age undetermined Inferior infarct , age undetermined Abnormal ECG When compared with ECG of 13-Jan-2022 15:37, poor baseline, grossly unchanged Confirmed by Wandra Arthurs ([id number]) on 02/14/2022 6:43:03 PM  Radiology DG Chest 2 View  Result Date: 02/14/2022 CLINICAL  DATA:  Dyspnea. EXAM: CHEST - 2 VIEW COMPARISON:  January 13, 2022. FINDINGS: Stable cardiomegaly. Status post coronary bypass graft. Bilateral perihilar and basilar interstitial densities are noted most consistent with pulmonary edema. Bony thorax is unremarkable. IMPRESSION: Bilateral interstitial densities are noted most consistent with pulmonary edema. Electronically Signed   By: Marijo Conception M.D.   On: 02/14/2022 13:20    Procedures Procedures    Medications Ordered in ED Medications  furosemide (LASIX) injection 60 mg (60 mg Intravenous Given 02/14/22 1920)    ED Course/ Medical Decision Making/ A&P                           Medical Decision Making MCKADE GURKA is a 88 y.o. male here presenting with shortness of breath and leg swelling.  Patient is already on Lasix for heart failure.  Patient was recently admitted for heart failure and gained about 20 pounds.  Clinically patient is fluid overloaded.  Concern for CHF exacerbation.  We will get CBC and BMP and troponin and BNP and chest x-ray.  8:07 PM I reviewed patient's labs and independently interpreted his chest x-ray.  Labs showed mild AKI with creatinine 2.9.  Patient's BNP is 470.  Patient was given 60 mg of IV Lasix.  Will admit for CHF exacerbation   Problems Addressed: Acute on chronic congestive heart failure, unspecified heart failure type [facility]): acute illness or injury  Amount and/or Complexity of Data Reviewed Labs: ordered. Decision-making details documented in ED Course. Radiology: ordered and independent interpretation performed. Decision-making details documented in ED Course. ECG/medicine tests: ordered and independent interpretation performed. Decision-making details documented in ED Course.  Risk Prescription drug management. Decision regarding hospitalization.   Final Clinical Impression(s) / ED Diagnoses Final diagnoses:  None    Rx / DC Orders ED Discharge Orders  None         Drenda Freeze, MD 02/14/22 2008

## 2022-02-14 NOTE — Assessment & Plan Note (Signed)
Continue with supplemental, Currently on 4 L/min. Wean to baseline 2 L/min

## 2022-02-14 NOTE — ED Provider Triage Note (Signed)
Emergency Medicine Provider Triage Evaluation Note  Jacob Silva , a 88 y.o. male  was evaluated in triage.  Pt complains of progressively worsening swelling since being discharged recently from the hospital.  Patient reports his dry weight is 210 and he currently weighs 230.  Worsening swelling despite recent increase in Lasix dose.  Reports left-sided chest pain but this has been ongoing since his recent discharge.  Scheduled for nuclear stress test tomorrow.  Review of Systems  Positive: As above Negative: As above  Physical Exam  BP 99/76 (BP Location: Left Arm)   Pulse 61   Temp 97.9 F (36.6 C) (Oral)   Resp 17   SpO2 91%  Gen:   Awake, no distress   Resp:  Normal effort  MSK:   Moves extremities without difficulty  Other:    Medical Decision Making  Medically screening exam initiated at 1:03 PM.  Appropriate orders placed.  NAJEEB UPTAIN was informed that the remainder of the evaluation will be completed by another provider, this initial triage assessment does not replace that evaluation, and the importance of remaining in the ED until their evaluation is complete.    Evlyn Courier, PA-C 02/14/22 1304

## 2022-02-14 NOTE — Subjective & Objective (Signed)
CC: LE edema, SOB HPI: 88 year old African-American male history of biventricular heart failure, CKD stage IIIb baseline creatinine approximately 2.0-2.3, chronic respiratory failure with hypoxia on oxygen at 2 L a minute, type 2 diabetes, coronary disease, hypertension presents the ER today with worsening lower extremity edema, shortness of breath and weight gain.  Patient's wife states that when he was discharged in the hospital he weighed approximately 210 pounds at discharge.  Over the last 2 weeks, he is increasingly gaining weight.  Today he weighed 232 pounds.  Over the last 2 weeks, his cardiologist has increased his Lasix to 60 mg a day (40 mg in the morning, 20 mg in the evening, and continued his torsemide.  Patient sees [place] cardiology.  Her last couple days, the patient's wife is noted increasing lower extremity edema.  She does all the cooking.  He has been compliant with taking his medications.  He is on a low-salt diet.  He does not drink more than 40 ounces of fluid a day.  However, the patient has been not wearing his compression stockings.  Wife was unaware that he needed to wear these during the daytime when he is up and about and even sitting in a chair.  She was only placing them on his lower legs when he was walking and doing any sort of activity.  When he was sitting in a chair, she kept these off of him.  During his last hospitalization, his echocardiogram showed an LVEF of 55 to 60%.  His RV function was severely reduced.  RV size was not severely enlarged.  He had severely elevated pulmonary artery systolic pressures with RV systolic pressure approaching 69 mmHg.  On arrival to the ER, temp 97.9 heart rate 79 blood pressure 175 satting 93% on 2 L.  Labs showed serum creatinine increased to 2.93 with a BUN of 33.  BNP elevated at 472.  Chest x-ray demonstrated pulmonary edema.  [facility] contacted for admission.

## 2022-02-14 NOTE — Assessment & Plan Note (Signed)
Stable. On ASA, imdur

## 2022-02-14 NOTE — ED Triage Notes (Signed)
Pt BIB POV with c/o SOB, CP & leg swelling that kept getting worse since yesterday. A/Ox4, wears O2 on 2l via n/c at baseline. Hx of CHF & COPD. Reports he has been taking his medications for both.

## 2022-02-14 NOTE — Assessment & Plan Note (Signed)
Hold ARB due to diuresis and AKI.

## 2022-02-14 NOTE — H&P (Signed)
History and Physical    Jacob Silva FIE:[id number] DOB: 06-26-1932 DOA: 02/14/2022  DOS: the patient was seen and examined on 02/14/2022  PCP: [name], MD   Patient coming from: Home  I have personally briefly reviewed patient's old medical records in [place]  CC: LE edema, SOB HPI: 88 year old African-American male history of biventricular heart failure, CKD stage IIIb baseline creatinine approximately 2.0-2.3, chronic respiratory failure with hypoxia on oxygen at 2 L a minute, type 2 diabetes, coronary disease, hypertension presents the ER today with worsening lower extremity edema, shortness of breath and weight gain.  Patient's wife states that when he was discharged in the hospital he weighed approximately 210 pounds at discharge.  Over the last 2 weeks, he is increasingly gaining weight.  Today he weighed 232 pounds.  Over the last 2 weeks, his cardiologist has increased his Lasix to 60 mg a day (40 mg in the morning, 20 mg in the evening, and continued his torsemide.  Patient sees [facility].  Her last couple days, the patient's wife is noted increasing lower extremity edema.  She does all the cooking.  He has been compliant with taking his medications.  He is on a low-salt diet.  He does not drink more than 40 ounces of fluid a day.  However, the patient has been not wearing his compression stockings.  Wife was unaware that he needed to wear these during the daytime when he is up and about and even sitting in a chair.  She was only placing them on his lower legs when he was walking and doing any sort of activity.  When he was sitting in a chair, she kept these off of him.  During his last hospitalization, his echocardiogram showed an LVEF of 55 to 60%.  His RV function was severely reduced.  RV size was not severely enlarged.  He had severely elevated pulmonary artery systolic pressures with RV systolic pressure approaching 69 mmHg.  On arrival to the ER,  temp 97.9 heart rate 79 blood pressure 175 satting 93% on 2 L.  Labs showed serum creatinine increased to 2.93 with a BUN of 33.  BNP elevated at 472.  Chest x-ray demonstrated pulmonary edema.  [facility] contacted for admission.   ED Course: CXR shows pulmonary edema Scr 2.93. given lasix 60 mg IV  Review of Systems:  Review of Systems  Constitutional: Negative.   HENT: Negative.    Eyes: Negative.   Respiratory:  Positive for shortness of breath.   Cardiovascular:  Positive for leg swelling. Negative for chest pain.  Gastrointestinal: Negative.   Genitourinary: Negative.   Musculoskeletal: Negative.   Skin: Negative.   Endo/Heme/Allergies: Negative.   Psychiatric/Behavioral: Negative.    All other systems reviewed and are negative.   Past Medical History:  Diagnosis Date   CHF (congestive heart failure) (HCC)    Diabetes (HCC)    HBP (high blood pressure)    NSTEMI (non-ST elevated myocardial infarction) ([place]) 01/13/2022    Past Surgical History:  Procedure Laterality Date   BACK SURGERY     HERNIA REPAIR     KNEE SURGERY Right    TRIPLE BYPASS       reports that he has never smoked. He has never used smokeless tobacco. He reports that he does not drink alcohol and does not use drugs.  Allergies  Allergen Reactions   Penicillin G Other (See Comments)   Penicillins Swelling   Tramadol  Other reaction(s): Dizziness    History reviewed. No pertinent family history.  Prior to Admission medications   Medication Sig Start Date End Date Taking? Authorizing Provider  acetaminophen (TYLENOL) 325 MG tablet Take 650 mg by mouth every 6 (six) hours as needed for mild pain.    [provider]  albuterol (VENTOLIN HFA) 108 (90 Base) MCG/ACT inhaler  10/12/13   [provider]  Alcohol Swabs (ALCOHOL WIPES) 70 % PADS IV Prep Wipes medicated    [provider]  allopurinol (ZYLOPRIM) 100 MG tablet Take 100 mg by mouth daily. 02/13/21    [provider]  aspirin EC 81 MG tablet Take 81 mg by mouth daily.    [provider]  atorvastatin (LIPITOR) 80 MG tablet Take 40 mg by mouth at bedtime.    [provider]  BD PEN NEEDLE MICRO U/F 32G X 6 MM MISC SMARTSIG:1 Pre-Filled Pen Syringe SUB-Q Every Night 05/13/19   [provider]  benazepril (LOTENSIN) 10 MG tablet Take 10 mg by mouth every evening. 03/01/20   [provider]  Blood Glucose Monitoring Suppl (FREESTYLE LITE) DEVI 2 (two) times daily. for testing 02/16/15   [provider]  carvedilol (COREG) 6.25 MG tablet Take 6.25 mg by mouth 2 (two) times daily with a meal.    [provider]  clopidogrel (PLAVIX) 75 MG tablet Take 75 mg by mouth daily.    [provider]  CORLANOR 5 MG TABS tablet Take 5 mg by mouth 2 (two) times daily. 12/20/21   [provider]  dapagliflozin propanediol (FARXIGA) 10 MG TABS tablet Take 10 mg by mouth daily. 03/14/21   [provider]  diclofenac Sodium (VOLTAREN) 1 % GEL Apply 2 g topically 4 (four) times daily as needed (pain). 02/05/21   [provider]  formoterol (PERFOROMIST) 20 MCG/2ML nebulizer solution Take 20 mcg by nebulization 2 (two) times daily.    [provider]  FREESTYLE LITE test strip TEST AS DIRECTED TWICE A DAY 02/16/15   [provider]  furosemide (LASIX) 20 MG tablet Take 20 mg by mouth daily.    [provider]  furosemide (LASIX) 40 MG tablet Take 1 tablet (40 mg total) by mouth daily. 01/20/22   Pahwani, Michell Heinrich, MD  gabapentin (NEURONTIN) 400 MG capsule Take 800 mg by mouth 2 (two) times daily.    [provider]  isosorbide mononitrate (IMDUR) 30 MG 24 hr tablet Take 30 mg by mouth daily.    [provider]  ketoconazole (NIZORAL) 2 % cream Apply 1 application. topically daily. 10/24/21   McDonald, Stephan Minister, DPM  Lancets (FREESTYLE) lancets TEST AS DIRECTED TWICE A DAY 02/16/15    [provider]  LANTUS SOLOSTAR 100 UNIT/ML Solostar Pen Inject 35 Units into the skin at bedtime. 10/07/15   [provider]  levothyroxine (SYNTHROID) 100 MCG tablet Take 100 mcg by mouth every morning. 12/11/20   [provider]  loperamide (IMODIUM) 2 MG capsule Take 2 mg by mouth daily as needed for diarrhea or loose stools. 03/13/20   [provider]  loratadine (CLARITIN) 10 MG tablet Take 10 mg by mouth daily as needed for allergies.    [provider]  omeprazole (PRILOSEC OTC) 20 MG tablet Take 20 mg by mouth daily.    [provider]  OXYGEN Inhale 2 L into the lungs continuous.    [provider]  potassium chloride SA (K-DUR,KLOR-CON) 20 MEQ tablet potassium  chloride ER 20 mEq tablet,extended release(part/cryst) Patient not taking: Reported on 01/14/2022    [provider]  Propylene Glycol (SYSTANE BALANCE) 0.6 % SOLN Place 1 drop into both eyes daily as needed (dry eyes).    [provider]  umeclidinium-vilanterol (ANORO ELLIPTA) 62.5-25 MCG/INH AEPB Inhale 1 puff into the lungs daily.    [provider]    Physical Exam: Vitals:   02/14/22 1604 02/14/22 1915 02/14/22 1922 02/14/22 1923  BP: 100/75 (!) 115/55  (!) 115/55  Pulse: 60   64  Resp: 16 19  18   Temp: 97.7 F (36.5 C)   98.3 F (36.8 C)  TempSrc: Oral   Oral  SpO2: 93%   100%  Weight:   104.8 kg   Height:   5\' 11"  (1.803 m)     Physical Exam Vitals and nursing note reviewed.  Constitutional:      General: He is not in acute distress.    Appearance: Normal appearance. He is not ill-appearing, toxic-appearing or diaphoretic.  HENT:     Head: Normocephalic and atraumatic.  Eyes:     General: No scleral icterus. Cardiovascular:     Rate and Rhythm: Normal rate and regular rhythm. Occasional Extrasystoles are present.    Pulses: Normal pulses.     Heart sounds: Murmur heard.  Pulmonary:     Effort: Pulmonary effort is  normal.     Breath sounds: Examination of the right-middle field reveals rales. Examination of the left-middle field reveals rales. Examination of the right-lower field reveals rales. Examination of the left-lower field reveals rales. Rales present.  Abdominal:     General: Bowel sounds are normal. There is no distension.     Palpations: Abdomen is soft.     Tenderness: There is no abdominal tenderness.     Hernia: No hernia is present.  Musculoskeletal:     Right lower leg: 3+ Edema present.     Left lower leg: 3+ Edema present.     Comments: +3 pitting LE edema bilaterally +1-2 distal thigh edema bilaterally +2 pitting pedal edema bilaterally  Skin:    General: Skin is warm and dry.     Capillary Refill: Capillary refill takes less than 2 seconds.  Neurological:     General: No focal deficit present.     Mental Status: He is alert and oriented to person, place, and time.      Labs on Admission: I have personally reviewed following labs and imaging studies  CBC: Recent Labs  Lab 02/14/22 1309  WBC 10.4  NEUTROABS 7.9*  HGB 11.7*  HCT 36.1*  MCV 79.5*  PLT 185*   Basic Metabolic Panel: Recent Labs  Lab 02/14/22 1309  NA 140  K 4.6  CL 103  CO2 26  GLUCOSE 127*  BUN 33*  CREATININE 2.93*  CALCIUM 8.8*  MG 2.0   GFR: Estimated Creatinine Clearance: 21.5 mL/min (A) (by C-G formula based on SCr of 2.93 mg/dL (H)). Liver Function Tests: Recent Labs  Lab 02/14/22 1309  AST 16  ALT 22  ALKPHOS 117  BILITOT 0.9  PROT 5.8*  ALBUMIN 3.0*   No results for input(s): "LIPASE", "AMYLASE" in the last 168 hours. No results for input(s): "AMMONIA" in the last 168 hours. Coagulation Profile: No results for input(s): "INR", "PROTIME" in the last 168 hours. Cardiac Enzymes: No results for input(s): "CKTOTAL", "CKMB", "CKMBINDEX", "TROPONINI", "TROPONINIHS" in the last 168 hours. BNP (last 3 results) No results for input(s): "PROBNP" in the  last 8760 hours. HbA1C: No  results for input(s): "HGBA1C" in the last 72 hours. CBG: No results for input(s): "GLUCAP" in the last 168 hours. Lipid Profile: No results for input(s): "CHOL", "HDL", "LDLCALC", "TRIG", "CHOLHDL", "LDLDIRECT" in the last 72 hours. Thyroid Function Tests: No results for input(s): "TSH", "T4TOTAL", "FREET4", "T3FREE", "THYROIDAB" in the last 72 hours. Anemia Panel: No results for input(s): "VITAMINB12", "FOLATE", "FERRITIN", "TIBC", "IRON", "RETICCTPCT" in the last 72 hours. Urine analysis: No results found for: "COLORURINE", "APPEARANCEUR", "LABSPEC", "PHURINE", "GLUCOSEU", "HGBUR", "BILIRUBINUR", "KETONESUR", "PROTEINUR", "UROBILINOGEN", "NITRITE", "LEUKOCYTESUR"  Radiological Exams on Admission: I have personally reviewed images DG Chest 2 View  Result Date: 02/14/2022 CLINICAL DATA:  Dyspnea. EXAM: CHEST - 2 VIEW COMPARISON:  January 13, 2022. FINDINGS: Stable cardiomegaly. Status post coronary bypass graft. Bilateral perihilar and basilar interstitial densities are noted most consistent with pulmonary edema. Bony thorax is unremarkable. IMPRESSION: Bilateral interstitial densities are noted most consistent with pulmonary edema. Electronically Signed   By: Marijo Conception M.D.   On: 02/14/2022 13:20    EKG: My personal interpretation of EKG shows: NSR with ectopic beat. I disagree with computer interpretation of afib.      Assessment/Plan Principal Problem:   Biventricular heart failure (HCC) Active Problems:   Acute renal failure superimposed on stage 3b chronic kidney disease (HCC)   Congestive heart failure with right ventricular systolic dysfunction (HCC)   CAD (coronary artery disease)   Chronic kidney disease, stage 3b (HCC)   Diabetes mellitus type II, controlled ([place])   HTN (hypertension)   Hypothyroidism   Acute on chronic respiratory failure with hypoxia ([place])   DNR (do not resuscitate)    Assessment and Plan: * Biventricular heart failure ([place]) Observation  telemetry bed. I think his weight gain and LE edema are more right sided heart failure rather than left sided failure.  However, given some of the pulmonary edema seen on CXR, he may have some left ventricular dysfunction. I think the biggest culprit in his exacerbation is pt not wearing compression stocking to help with his venous return. Discussed this with pt and his wife. I don't think she was not placing his stocking on purpose but did not know that he was suppose to wear they all the time except when he is lying flat in bed sleeping. Will continue with lasix 60 mg IV q12h. Add po diamox. Monitor Scr. Add ACE bandage compression wraps. I think his legs are too big for even XL Ted hose. No need to repeat echo as this was just done last month.  Congestive heart failure with right ventricular systolic dysfunction (HCC) Pt with reduced RV systolic function and enlarged RV diameter, along with RV systolic pressures approaching systemic pressures. Pt has pulmonary artery hypertension.  Acute renal failure superimposed on stage 3b chronic kidney disease ([place]) Due to left ventricular dysfunction. Will need to hold ARB. Continue with diuresis. Monitor Scr.   Acute on chronic respiratory failure with hypoxia (HCC) Continue with supplemental, Currently on 4 L/min. Wean to baseline 2 L/min  Hypothyroidism Stable. Continue synthroid 100 mcg daily.  HTN (hypertension) Hold ARB due to diuresis and AKI.   Diabetes mellitus type II, controlled ([place]) Add SSI. Continue with reduced dose of lantus due to AKI.  Chronic kidney disease, stage 3b ([place]) Acute worsened due to CHF exacerbation.  CAD (coronary artery disease) Stable. On ASA, imdur  DNR (do not resuscitate) Verified with pt and wife(kathy, HCPOA). She verifies that pt is a DNR. She states  she has living will at home and will bring it tomorrow for it to be copied and scanned into system.   DVT prophylaxis: SQ Heparin Code Status: DNR/DNI(Do  NOT Intubate) verified with pt and wife [place] Family Communication: discussed with pt and wife kathy at bedside  Disposition Plan: return home  Consults called: none  Admission status: Observation, Telemetry bed   Kristopher Oppenheim, DO [facility] 02/14/2022, 8:03 PM

## 2022-02-14 NOTE — ED Notes (Addendum)
Meal given. 12 oz drink given with meds.

## 2022-02-14 NOTE — Assessment & Plan Note (Addendum)
Observation telemetry bed. I think his weight gain and LE edema are more right sided heart failure rather than left sided failure.  However, given some of the pulmonary edema seen on CXR, he may have some left ventricular dysfunction. I think the biggest culprit in his exacerbation is pt not wearing compression stocking to help with his venous return. Discussed this with pt and his wife. I don't think she was not placing his stocking on purpose but did not know that he was suppose to wear they all the time except when he is lying flat in bed sleeping. Will continue with lasix 60 mg IV q12h. Add po diamox. Monitor Scr. Add ACE bandage compression wraps. I think his legs are too big for even XL Ted hose. No need to repeat echo as this was just done last month.

## 2022-02-14 NOTE — Assessment & Plan Note (Signed)
Stable. Continue synthroid 100 mcg daily. 

## 2022-02-14 NOTE — Assessment & Plan Note (Signed)
Due to left ventricular dysfunction. Will need to hold ARB. Continue with diuresis. Monitor Scr.

## 2022-02-15 ENCOUNTER — Encounter (HOSPITAL_COMMUNITY): Payer: Self-pay | Admitting: Internal Medicine

## 2022-02-15 DIAGNOSIS — Z66 Do not resuscitate: Secondary | ICD-10-CM | POA: Diagnosis present

## 2022-02-15 DIAGNOSIS — I5031 Acute diastolic (congestive) heart failure: Secondary | ICD-10-CM | POA: Diagnosis not present

## 2022-02-15 DIAGNOSIS — I2510 Atherosclerotic heart disease of native coronary artery without angina pectoris: Secondary | ICD-10-CM | POA: Diagnosis present

## 2022-02-15 DIAGNOSIS — Z880 Allergy status to penicillin: Secondary | ICD-10-CM | POA: Diagnosis not present

## 2022-02-15 DIAGNOSIS — I509 Heart failure, unspecified: Secondary | ICD-10-CM | POA: Diagnosis not present

## 2022-02-15 DIAGNOSIS — Z951 Presence of aortocoronary bypass graft: Secondary | ICD-10-CM | POA: Diagnosis not present

## 2022-02-15 DIAGNOSIS — N179 Acute kidney failure, unspecified: Secondary | ICD-10-CM | POA: Diagnosis present

## 2022-02-15 DIAGNOSIS — I2729 Other secondary pulmonary hypertension: Secondary | ICD-10-CM | POA: Diagnosis present

## 2022-02-15 DIAGNOSIS — I252 Old myocardial infarction: Secondary | ICD-10-CM | POA: Diagnosis not present

## 2022-02-15 DIAGNOSIS — I130 Hypertensive heart and chronic kidney disease with heart failure and stage 1 through stage 4 chronic kidney disease, or unspecified chronic kidney disease: Secondary | ICD-10-CM | POA: Diagnosis present

## 2022-02-15 DIAGNOSIS — E1122 Type 2 diabetes mellitus with diabetic chronic kidney disease: Secondary | ICD-10-CM | POA: Diagnosis present

## 2022-02-15 DIAGNOSIS — D509 Iron deficiency anemia, unspecified: Secondary | ICD-10-CM | POA: Diagnosis present

## 2022-02-15 DIAGNOSIS — N1832 Chronic kidney disease, stage 3b: Secondary | ICD-10-CM | POA: Diagnosis present

## 2022-02-15 DIAGNOSIS — Z885 Allergy status to narcotic agent status: Secondary | ICD-10-CM | POA: Diagnosis not present

## 2022-02-15 DIAGNOSIS — I2721 Secondary pulmonary arterial hypertension: Secondary | ICD-10-CM | POA: Diagnosis present

## 2022-02-15 DIAGNOSIS — J449 Chronic obstructive pulmonary disease, unspecified: Secondary | ICD-10-CM | POA: Diagnosis present

## 2022-02-15 DIAGNOSIS — E039 Hypothyroidism, unspecified: Secondary | ICD-10-CM | POA: Diagnosis present

## 2022-02-15 DIAGNOSIS — E785 Hyperlipidemia, unspecified: Secondary | ICD-10-CM | POA: Diagnosis present

## 2022-02-15 DIAGNOSIS — E1149 Type 2 diabetes mellitus with other diabetic neurological complication: Secondary | ICD-10-CM

## 2022-02-15 DIAGNOSIS — Z79890 Hormone replacement therapy (postmenopausal): Secondary | ICD-10-CM | POA: Diagnosis not present

## 2022-02-15 DIAGNOSIS — J9621 Acute and chronic respiratory failure with hypoxia: Secondary | ICD-10-CM | POA: Diagnosis present

## 2022-02-15 DIAGNOSIS — Z7982 Long term (current) use of aspirin: Secondary | ICD-10-CM | POA: Diagnosis not present

## 2022-02-15 DIAGNOSIS — I440 Atrioventricular block, first degree: Secondary | ICD-10-CM | POA: Diagnosis present

## 2022-02-15 DIAGNOSIS — Z79899 Other long term (current) drug therapy: Secondary | ICD-10-CM | POA: Diagnosis not present

## 2022-02-15 DIAGNOSIS — G4733 Obstructive sleep apnea (adult) (pediatric): Secondary | ICD-10-CM | POA: Diagnosis present

## 2022-02-15 DIAGNOSIS — I5082 Biventricular heart failure: Secondary | ICD-10-CM | POA: Diagnosis present

## 2022-02-15 DIAGNOSIS — I5043 Acute on chronic combined systolic (congestive) and diastolic (congestive) heart failure: Secondary | ICD-10-CM | POA: Diagnosis present

## 2022-02-15 LAB — COMPREHENSIVE METABOLIC PANEL
ALT: 19 U/L (ref 0–44)
AST: 15 U/L (ref 15–41)
Albumin: 2.9 g/dL — ABNORMAL LOW (ref 3.5–5.0)
Alkaline Phosphatase: 112 U/L (ref 38–126)
Anion gap: 6 (ref 5–15)
BUN: 35 mg/dL — ABNORMAL HIGH (ref 8–23)
CO2: 30 mmol/L (ref 22–32)
Calcium: 8.8 mg/dL — ABNORMAL LOW (ref 8.9–10.3)
Chloride: 107 mmol/L (ref 98–111)
Creatinine, Ser: 2.76 mg/dL — ABNORMAL HIGH (ref 0.61–1.24)
GFR, Estimated: 21 mL/min — ABNORMAL LOW (ref >60)
Glucose, Bld: 119 mg/dL — ABNORMAL HIGH (ref 70–99)
Potassium: 4.3 mmol/L (ref 3.5–5.1)
Sodium: 143 mmol/L (ref 135–145)
Total Bilirubin: 0.7 mg/dL (ref 0.3–1.2)
Total Protein: 6.1 g/dL — ABNORMAL LOW (ref 6.5–8.1)

## 2022-02-15 LAB — CBC WITH DIFFERENTIAL/PLATELET
Abs Immature Granulocytes: 0.05 10*3/uL (ref 0.00–0.07)
Basophils Absolute: 0 10*3/uL (ref 0.0–0.1)
Basophils Relative: 0 %
Eosinophils Absolute: 0.8 10*3/uL — ABNORMAL HIGH (ref 0.0–0.5)
Eosinophils Relative: 8 %
HCT: 35.9 % — ABNORMAL LOW (ref 39.0–52.0)
Hemoglobin: 11.4 g/dL — ABNORMAL LOW (ref 13.0–17.0)
Immature Granulocytes: 1 %
Lymphocytes Relative: 13 %
Lymphs Abs: 1.4 10*3/uL (ref 0.7–4.0)
MCH: 25.5 pg — ABNORMAL LOW (ref 26.0–34.0)
MCHC: 31.8 g/dL (ref 30.0–36.0)
MCV: 80.3 fL (ref 80.0–100.0)
Monocytes Absolute: 0.8 10*3/uL (ref 0.1–1.0)
Monocytes Relative: 7 %
Neutro Abs: 7.8 10*3/uL — ABNORMAL HIGH (ref 1.7–7.7)
Neutrophils Relative %: 71 %
Platelets: 155 10*3/uL (ref 150–400)
RBC: 4.47 MIL/uL (ref 4.22–5.81)
RDW: 18.1 % — ABNORMAL HIGH (ref 11.5–15.5)
WBC: 10.9 10*3/uL — ABNORMAL HIGH (ref 4.0–10.5)
nRBC: 0 % (ref 0.0–0.2)

## 2022-02-15 LAB — GLUCOSE, CAPILLARY
Glucose-Capillary: 161 mg/dL — ABNORMAL HIGH (ref 70–99)
Glucose-Capillary: 165 mg/dL — ABNORMAL HIGH (ref 70–99)

## 2022-02-15 LAB — CBG MONITORING, ED
Glucose-Capillary: 97 mg/dL (ref 70–99)
Glucose-Capillary: 105 mg/dL — ABNORMAL HIGH (ref 70–99)

## 2022-02-15 LAB — MAGNESIUM: Magnesium: 2 mg/dL (ref 1.7–2.4)

## 2022-02-15 NOTE — Progress Notes (Signed)
[facility]  PROGRESS NOTE  Jacob Silva UTM:[id number] DOB: 30-Dec-1931 DOA: 02/14/2022 PCP: [name], MD   Brief HPI:   88 year old African-American male with history of biventricular heart failure, CKD stage IIIb, baseline creatinine to 2.3, chronic respiratory failure with hypoxemia on oxygen 2 L/min, diabetes mellitus type 2, CAD, hypertension came to ED with worsening lower extremity edema, shortness of breath and weight gain.  Patient wife says that after he was discharged from the hospital he weighed 210 pounds over the past 2 weeks he is increasingly gaining weight and he weighed 232 pounds at home.  His cardiologist had increased Lasix to 60 mg daily. Over past few days wife noted that he was having increasing lower extremity edema.  Patient's echocardiogram during last hospitalization showed EF of 55 to 60%.  Right ventricle function was severely reduced.  Chest x-ray in the ED showed pulmonary edema.  Patient started on Lasix 60 mg IV twice daily.    Subjective   Patient denies shortness of breath.  Diuresing well with Lasix.   Assessment/Plan:   Acute on chronic biventricular heart failure -Presented with worsening edema, weight gain; secondary to both left and right heart failure -Patient started on Lasix 60 mg IV every 12 hours -Also started on Diamox -Strict intake and output -Daily weight monitoring -We will consult cardiology for further recommendations  Acute kidney injury superimposed on CKD stage IIIb -Secondary to left ventricular dysfunction -Creatinine improving with diuresis -Continue IV Lasix  Acute on chronic hypoxemic respiratory failure -Continue supplemental oxygen  -Patient is on 2 L/minute oxygen at home   Hypothyroidism -Continue Synthroid 100 mcg daily  Hypertension Continue Coreg 6.25 twice daily -ARB on hold due to acute kidney injury  Diabetes mellitus type 2 -CBG well controlled -Continue Lantus -Sliding scale  insulin NovoLog   Medications     acetaZOLAMIDE  250 mg Oral BID   aspirin EC  81 mg Oral Daily   atorvastatin  40 mg Oral QHS   carvedilol  6.25 mg Oral BID WC   furosemide  60 mg Intravenous Q12H   heparin  5,000 Units Subcutaneous Q8H   insulin aspart  0-5 Units Subcutaneous QHS   insulin aspart  0-9 Units Subcutaneous TID WC   insulin glargine-yfgn  10 Units Subcutaneous QHS   isosorbide mononitrate  30 mg Oral Daily   levothyroxine  100 mcg Oral q morning   omeprazole  20 mg Oral Daily     Data Reviewed:   CBG:  Recent Labs  Lab 02/14/22 2319 02/15/22 0832 02/15/22 1204  GLUCAP 98 97 105*    SpO2: 95 % O2 Flow Rate (L/min): 2 L/min    Vitals:   02/15/22 1230 02/15/22 1244 02/15/22 1530 02/15/22 1537  BP: 102/67  (!) 107/58   Pulse: 65     Resp: 19  (!) 21   Temp:  98.2 F (36.8 C)  98.5 F (36.9 C)  TempSrc:  Oral  Oral  SpO2: 95%     Weight:      Height:          Data Reviewed:  Basic Metabolic Panel: Recent Labs  Lab 02/14/22 1309 02/15/22 0547  NA 140 143  K 4.6 4.3  CL 103 107  CO2 26 30  GLUCOSE 127* 119*  BUN 33* 35*  CREATININE 2.93* 2.76*  CALCIUM 8.8* 8.8*  MG 2.0 2.0    CBC: Recent Labs  Lab 02/14/22 1309 02/15/22 0547  WBC 10.4 10.9*  NEUTROABS 7.9* 7.8*  HGB 11.7* 11.4*  HCT 36.1* 35.9*  MCV 79.5* 80.3  PLT 149* 155    LFT Recent Labs  Lab 02/14/22 1309 02/15/22 0547  AST 16 15  ALT 22 19  ALKPHOS 117 112  BILITOT 0.9 0.7  PROT 5.8* 6.1*  ALBUMIN 3.0* 2.9*     Antibiotics: Anti-infectives (From admission, onward)    None        DVT prophylaxis: Heparin  Code Status: Full code  Family Communication: No family at bedside   CONSULTS    Objective    Physical Examination:  General-appears in no acute distress Heart-S1-S2, regular, no murmur auscultated Lungs-clear to auscultation bilaterally, no wheezing or crackles auscultated Abdomen-soft, nontender, no organomegaly Extremities-2  + edema in the lower extremities Neuro-alert, oriented x3, no focal deficit noted  Status is: Inpatient:          Oswald Hillock   [facility] If 7PM-7AM, please contact night-coverage at [email or web address], Office  [phone]   02/15/2022, 3:50 PM  LOS: 0 days

## 2022-02-15 NOTE — Consult Note (Addendum)
Cardiology Consult    Patient ID: [name] MRN: [id number], DOB/AGE: [birth year]   Admit date: 02/14/2022 Date of Consult: 02/15/2022  Primary Physician: [name], MD Primary Cardiologist: Laurelyn Sickle, MD Requesting Provider: Georgiann Mohs, MD  Patient Profile    Jacob Silva is a 88 y.o. male with a history of CAD s/p CABG, chronic HFpEF, RV failure, COPD, PAH, pulm fibrosis, CKD III-IV, HTN, HL, DMII, and OSA, who is being seen today for the evaluation of recurrent CHF at the request of Dr. Darrick Meigs.  Past Medical History   Past Medical History:  Diagnosis Date   CAD (coronary artery disease)    a. 2013 s/p CABG x 3 (LIMA->dLAD, VG->RPDA, VG->OM1); b. 12/2021 NSTEMI (peak HsTrop 641)-->Med rx in setting of CKD IV. EF 55-60% w/ rwma by echo.   Chronic heart failure with preserved ejection fraction (HFpEF) ([place])    a. 12/2021 Echo: EF 55-60%, no rwma, sev RV dysfxn w/ RVSP 69.87mmHg. Sev dil RA. Triv MR. Ao sclerosis.   CKD (chronic kidney disease), stage IV (HCC)    COPD (chronic obstructive pulmonary disease) ([place])    a. Home O2.   Diabetes ([place])    Essential hypertension    Hyperlipidemia LDL goal <70    NSTEMI (non-ST elevated myocardial infarction) ([place]) 01/13/2022   OSA (obstructive sleep apnea)    Pulmonary fibrosis (HCC)     Past Surgical History:  Procedure Laterality Date   BACK SURGERY     HERNIA REPAIR     KNEE SURGERY Right    TRIPLE BYPASS       Allergies  Allergies  Allergen Reactions   Penicillin G Other (See Comments)   Penicillins Swelling   Tramadol     Other reaction(s): Dizziness    History of Present Illness    88 y.o. male with a history of CAD s/p CABG, chronic HFpEF, RV failure, COPD, PAH, pulm fibrosis, CKD III-IV, HTN, HL, DMII, and OSA.  Patient is followed by Dr. Chancy Milroy in [place] as well as the [facility].  He previous underwent CABG x3 at [facility] in 2013 (LIMA to the LAD, vein graft to the PDA, vein graft to the OM1).  He is followed  by pulmonology in [place] in the setting of COPD with question of pulmonary fibrosis on home O2.  In that setting, he has chronic dyspnea on exertion with very limited activity at home.  On June 18, he presented to [facility] in the setting of a 1 week history of worsening dyspnea and orthopnea.  He was admitted and treated for acute on chronic heart failure with preserved EF and right ventricular heart failure.  He did have an elevated troponin to a peak of 641.  Echocardiogram showed an EF of 55 to 60% with severe RV dysfunction and an RVSP of 69.2 mmHg.  He was aggressively diuresed with good response and improving creatinine (2.07 on discharge).  He was discharged home on June 24 at 210 pounds.  Following discharge, he went to [place] to visit family for a week and since returning, he has noted progressive lower extremity edema, some increase in abdominal bloating, and progressive dyspnea on exertion.  Earlier this week, his endocrinologist recommended that he start taking Lasix 40 mg in the morning and 20 mg in the afternoon (was discharged home on 40 mg daily).  Unfortunately, this did not improve his swelling.  He finally received a scale through the [place] yesterday, and his weight was  up to 231 pounds.  With progressive symptoms and weight gain, his wife brought him to the emergency department.  Here, his chest x-ray showed bilateral interstitial densities/pulmonary edema.  EKG showed significant baseline artifact but on telemetry, he has been in sinus rhythm with first-degree AV block with rates in the 70s.  Lab work notable for further rise in BUN and creatinine to 33 and 2.93 respectively with stable microcytic anemia at 11.7/36.1.  He has been placed on Lasix 60 mg IV twice daily and we have been asked to consult.  He is currently asymptomatic at rest.  Inpatient Medications     acetaZOLAMIDE  250 mg Oral BID   aspirin EC  81 mg Oral Daily   atorvastatin  40 mg Oral QHS   carvedilol  6.25 mg  Oral BID WC   dapagliflozin propanediol  10 mg Oral Daily   furosemide  60 mg Intravenous Q12H   heparin  5,000 Units Subcutaneous Q8H   insulin aspart  0-5 Units Subcutaneous QHS   insulin aspart  0-9 Units Subcutaneous TID WC   insulin glargine-yfgn  10 Units Subcutaneous QHS   isosorbide mononitrate  30 mg Oral Daily   ivabradine  5 mg Oral BID   levothyroxine  100 mcg Oral q morning   omeprazole  20 mg Oral Daily    Family History    History reviewed. No pertinent family history. He indicated that his mother is deceased. He indicated that his father is deceased.   Social History    Social History   Socioeconomic History   Marital status: Married    Spouse name: Not on file   Number of children: Not on file   Years of education: Not on file   Highest education level: Not on file  Occupational History   Not on file  Tobacco Use   Smoking status: Never   Smokeless tobacco: Never  Substance and Sexual Activity   Alcohol use: No   Drug use: No   Sexual activity: Not on file  Other Topics Concern   Not on file  Social History Narrative   Lives in [place] with his wife.  Does not routinely exercise.   Social Determinants of Health   Financial Resource Strain: Not on file  Food Insecurity: Not on file  Transportation Needs: Not on file  Physical Activity: Not on file  Stress: Not on file  Social Connections: Not on file  Intimate Partner Violence: Not on file     Review of Systems    General:  No chills, fever, night sweats or weight changes.  Cardiovascular:  +++ occasional fleeting/sharp/positional chest pain, +++ dyspnea on exertion, +++ edema, +++ occasional orthopnea, no palpitations, paroxysmal nocturnal dyspnea. Dermatological: No rash, lesions/masses Respiratory: No cough, +++ chronic dyspnea Urologic: No hematuria, dysuria Abdominal:   No nausea, vomiting, diarrhea, bright red blood per rectum, melena, or hematemesis Neurologic:  No visual  changes, wkns, changes in mental status. All other systems reviewed and are otherwise negative except as noted above.  Physical Exam    Blood pressure 102/67, pulse 65, temperature 98.2 F (36.8 C), temperature source Oral, resp. rate 19, height 5\' 11"  (1.803 m), weight 104.8 kg, SpO2 95 %.  General: Pleasant, NAD Psych: Normal affect. Neuro: Alert and oriented X 3. Moves all extremities spontaneously. HEENT: Normal  Neck: Supple without bruits.  JVD to earlobes. Lungs:  Resp regular and unlabored, diminished breath sounds with crackles halfway up bilaterally. Heart: RRR, 2/6 systolic murmur at  the lower sternal border, no s3, s4. Abdomen: Protuberant and semifirm.  Nontender.  BS + x 4.  Extremities: No clubbing, cyanosis.  2+ bilateral lower extremity edema to the knees. DP/PT1+, Radials 2+ and equal bilaterally.  Labs       BNP    Component Value Date/Time   BNP 472.4 (H) 02/14/2022 1309   Lab Results  Component Value Date   WBC 10.9 (H) 02/15/2022   HGB 11.4 (L) 02/15/2022   HCT 35.9 (L) 02/15/2022   MCV 80.3 02/15/2022   PLT 155 02/15/2022    Recent Labs  Lab 02/15/22 0547  NA 143  K 4.3  CL 107  CO2 30  BUN 35*  CREATININE 2.76*  CALCIUM 8.8*  PROT 6.1*  BILITOT 0.7  ALKPHOS 112  ALT 19  AST 15  GLUCOSE 119*     Radiology Studies    US Renal  Result Date: 02/14/2022 CLINICAL DATA:  Renal failure EXAM: RENAL / URINARY TRACT ULTRASOUND COMPLETE COMPARISON:  None Available. FINDINGS: Right Kidney: Renal measurements: 8.5 x 4.6 x 5.3 cm = volume: 106.6 mL. Normal renal echotexture. No hydronephrosis. There are 2 simple appearing right renal cysts, measuring up to 4.9 and 3.0 cm each. Left Kidney: Renal measurements: 10.6 x 5.8 by 5.7 cm = volume: 181.4 mL. Normal renal echotexture. No hydronephrosis. There are 2 simple appearing left renal cysts, measuring up to 2.3 and 2.0 cm each. Bladder: Appears normal for degree of bladder distention. Other: None.  IMPRESSION: 1. Simple appearing bilateral renal cysts. 2. Otherwise unremarkable renal ultrasound. Electronically Signed   By: Randa Ngo M.D.   On: 02/14/2022 21:11   DG Chest 2 View  Result Date: 02/14/2022 CLINICAL DATA:  Dyspnea. EXAM: CHEST - 2 VIEW COMPARISON:  January 13, 2022. FINDINGS: Stable cardiomegaly. Status post coronary bypass graft. Bilateral perihilar and basilar interstitial densities are noted most consistent with pulmonary edema. Bony thorax is unremarkable. IMPRESSION: Bilateral interstitial densities are noted most consistent with pulmonary edema. Electronically Signed   By: Marijo Conception M.D.   On: 02/14/2022 13:20    ECG & Cardiac Imaging    Significant baseline artifact with question of A-fib versus sinus rhythm though telemetry at the same time shows regular sinus rhythm with first-degree AV block.  Twelve-lead also shows heart rate of 78, RAD, inc RBBB, RVH, inferior infarct - personally reviewed   Assessment & Plan    1.  Acute on chronic heart failure with preserved ejection fraction/right ventricular heart failure: Patient with a history of HFpEF and RV failure with recent admission for volume overload in June with discharge weight of 210 pounds following aggressive diuresis.  Echo at that time showed an EF of 55 to 60% with an RVSP of 69.2 mmHg and severe RV dysfunction.  Over the past few weeks, he has noted progressive lower extremity edema, increasing abdominal bloating, and dyspnea on exertion.  He weighed himself for the first time yesterday, and his weight was up from 210 pounds at discharge to 3 231 pounds on his home scale.  This prompted him to present to the ED.  Here, chest x-ray is consistent with pulmonary edema.  BUN and creatinine are elevated above prior baseline at 35 and 2.76, likely in the setting of cardiorenal syndrome.  He has been treated with Lasix 60 mg IV twice daily, and is currently resting comfortably.  Agree with continuation of Lasix and  acetazolamide as ordered.  Continue to follow renal function closely with  diuresis.  Heart rates and blood pressure have been stable.  Continue beta-blocker, and nitrate therapy.  Of note, he is currently on ivabradine w/ unclear indication.  We will discontinue.  In the setting of worsening renal fxn, will hold SGLT2i.  2.  Coronary disease: Status post prior CABG x3 in 2013 w/ recent NSTEMI.  He occasionally experiences chest discomfort but this is fleeting and positional in nature.  In the setting of stage III-IV chronic kidney disease, he was medically managed during last admission and echo showed an EF of 55 to 60% without regional wall motion abnormalities.  He has not had any recent chest discomfort.  Continue medical therapy with aspirin, statin, beta-blocker, and nitrate therapy.  3.  Essential hypertension: Stable.  4.  Hyperlipidemia: Continue statin therapy.  5.  Acute on chronic stage III-IV kidney disease: Suspect some degree of cardiorenal syndrome.  Follow with diuresis.  We will hold Farxiga in the setting of worsening renal function and drop in creatinine clearance.  6.  COPD/pulmonary fibrosis/pulmonary hypertension: On home O2.  Pulmonary hypertension right heart failure certain playing a role in his recurrent heart failure.  Per medicine team.  7.  Type 2 diabetes mellitus: Per medicine team.  8.  Obstructive sleep apnea: Uses CPAP at night.  Risk Assessment/Risk Scores:        New York Heart Association (NYHA) Functional Class NYHA Class IV    Signed, Murray Hodgkins, NP 02/15/2022, 1:42 PM  For questions or updates, please contact   Please consult [email or web address] for contact info under Cardiology/STEMI.   Personally seen and examined. Agree with above.  88 year old male status post CABG in 2002 patient of Dr. Chancy Milroy in [place] here with acute diastolic heart failure with concomitant RV failure worsening lower extremity edema, abdominal edema.  Wife at  bedside.  Crackles are heard on lung exam, could be pulmonary fibrosis however.  JVD noted.  3+ lower extremity edema noted.  Regular rate and rhythm with rare ectopy, 2/6 systolic murmur abdomen protuberant  Creatinine 2.76, bicarb 30  Telemetry-appears to be sinus rhythm, not atrial fibrillation.  Occasional PACs noted.  Assessment and plan:  Acute right-sided heart failure, acute diastolic heart failure - We will give him IV Lasix 60 mg twice a day.  Hopefully creatinine will gently improve as we improve his cardiorenal syndrome. -231 pounds on home scale, was 207 last discharge.  Clearly edematous.  RV failure discussed with he and his wife, nausea at times decreased appetite, increased lower extremity as well as abdominal edema. -I am fine with the addition of acetazolamide as well if this is desired. -We are discontinuing his ivabradine given his first-degree AV block.  Be careful with carvedilol 6.25 mg twice a day given first-degree AV block as well.   CAD post CABG - Very rare chest discomfort.  Overall continue with goal-directed medical therapy which includes aspirin statin beta-blocker nitrates.  Hyperlipidemia - Continue with statin  Stage IV chronic kidney disease - Likely in part cardiorenal as well.  Holding off on Farxiga in the setting of worsening renal function and drop in GFR.  Secondary pulmonary hypertension - Both from LV as well as respiratory symptoms of pulmonary fibrosis.  On home O2.   Candee Furbish, MD

## 2022-02-16 DIAGNOSIS — I2510 Atherosclerotic heart disease of native coronary artery without angina pectoris: Secondary | ICD-10-CM | POA: Diagnosis not present

## 2022-02-16 DIAGNOSIS — N1832 Chronic kidney disease, stage 3b: Secondary | ICD-10-CM | POA: Diagnosis not present

## 2022-02-16 DIAGNOSIS — E1149 Type 2 diabetes mellitus with other diabetic neurological complication: Secondary | ICD-10-CM | POA: Diagnosis not present

## 2022-02-16 DIAGNOSIS — I10 Essential (primary) hypertension: Secondary | ICD-10-CM

## 2022-02-16 DIAGNOSIS — I5082 Biventricular heart failure: Secondary | ICD-10-CM | POA: Diagnosis not present

## 2022-02-16 LAB — BASIC METABOLIC PANEL
Anion gap: 13 (ref 5–15)
BUN: 33 mg/dL — ABNORMAL HIGH (ref 8–23)
CO2: 26 mmol/L (ref 22–32)
Calcium: 9 mg/dL (ref 8.9–10.3)
Chloride: 105 mmol/L (ref 98–111)
Creatinine, Ser: 2.53 mg/dL — ABNORMAL HIGH (ref 0.61–1.24)
GFR, Estimated: 24 mL/min — ABNORMAL LOW (ref >60)
Glucose, Bld: 97 mg/dL (ref 70–99)
Potassium: 4 mmol/L (ref 3.5–5.1)
Sodium: 144 mmol/L (ref 135–145)

## 2022-02-16 LAB — GLUCOSE, CAPILLARY
Glucose-Capillary: 98 mg/dL (ref 70–99)
Glucose-Capillary: 163 mg/dL — ABNORMAL HIGH (ref 70–99)
Glucose-Capillary: 128 mg/dL — ABNORMAL HIGH (ref 70–99)
Glucose-Capillary: 90 mg/dL (ref 70–99)

## 2022-02-16 NOTE — Progress Notes (Addendum)
[facility]  PROGRESS NOTE  Jacob Silva JYN:[id number] DOB: 1932/05/08 DOA: 02/14/2022 PCP: [name], MD   Brief HPI:   88 year old African-American male with history of biventricular heart failure, CKD stage IIIb, baseline creatinine to 2.3, chronic respiratory failure with hypoxemia on oxygen 2 L/min, diabetes mellitus type 2, CAD, hypertension came to ED with worsening lower extremity edema, shortness of breath and weight gain.  Patient wife says that after he was discharged from the hospital he weighed 210 pounds over the past 2 weeks he is increasingly gaining weight and he weighed 232 pounds at home.  His cardiologist had increased Lasix to 60 mg daily. Over past few days wife noted that he was having increasing lower extremity edema.  Patient's echocardiogram during last hospitalization showed EF of 55 to 60%.  Right ventricle function was severely reduced.  Chest x-ray in the ED showed pulmonary edema.  Patient started on Lasix 60 mg IV twice daily.    Subjective   Patient seen and examined, breathing has improved.  Leg swelling also improved with diuresis.   Assessment/Plan:   Acute on chronic biventricular heart failure -Presented with worsening edema, weight gain; secondary to both left and right heart failure -Patient started on Lasix 60 mg IV every 12 hours -Also started on Diamox; blood pressure is soft, will hold Diamox at this time -Breathing has improved, leg swelling also improved. -Strict intake and output -Daily weight monitoring -Cardiology following  Acute kidney injury superimposed on CKD stage IIIb -Secondary to left ventricular dysfunction -Creatinine improving with diuresis; today creatinine is down to 2.53 -Continue IV Lasix  Acute on chronic hypoxemic respiratory failure -Continue supplemental oxygen  -Patient is on 2 L/minute oxygen at home   Hypothyroidism -Continue Synthroid 100 mcg daily  Hypertension Continue Coreg 6.25  twice daily -ARB on hold due to acute kidney injury  Diabetes mellitus type 2 -CBG well controlled -Continue Lantus -Sliding scale insulin NovoLog   Medications     acetaZOLAMIDE  250 mg Oral BID   aspirin EC  81 mg Oral Daily   atorvastatin  40 mg Oral QHS   carvedilol  6.25 mg Oral BID WC   furosemide  60 mg Intravenous Q12H   heparin  5,000 Units Subcutaneous Q8H   insulin aspart  0-5 Units Subcutaneous QHS   insulin aspart  0-9 Units Subcutaneous TID WC   insulin glargine-yfgn  10 Units Subcutaneous QHS   isosorbide mononitrate  30 mg Oral Daily   levothyroxine  100 mcg Oral q morning   omeprazole  20 mg Oral Daily     Data Reviewed:   CBG:  Recent Labs  Lab 02/15/22 1204 02/15/22 1633 02/15/22 2105 02/16/22 0551 02/16/22 1134  GLUCAP 105* 161* 165* 98 163*    SpO2: 98 % O2 Flow Rate (L/min): 3 L/min    Vitals:   02/16/22 0000 02/16/22 0423 02/16/22 0900 02/16/22 0918  BP: 112/85 (!) 99/55 (!) 97/54 (!) 100/52  Pulse: 67 69 69   Resp:   16   Temp: 98.6 F (37 C) 98.5 F (36.9 C) 98.2 F (36.8 C)   TempSrc: Oral Oral Oral   SpO2: 90% 91% 98%   Weight:  99.9 kg    Height:          Data Reviewed:  Basic Metabolic Panel: Recent Labs  Lab 02/14/22 1309 02/15/22 0547 02/16/22 0253  NA 140 143 144  K 4.6 4.3 4.0  CL 103 107 105  CO2  26 30 26   GLUCOSE 127* 119* 97  BUN 33* 35* 33*  CREATININE 2.93* 2.76* 2.53*  CALCIUM 8.8* 8.8* 9.0  MG 2.0 2.0  --     CBC: Recent Labs  Lab 02/14/22 1309 02/15/22 0547  WBC 10.4 10.9*  NEUTROABS 7.9* 7.8*  HGB 11.7* 11.4*  HCT 36.1* 35.9*  MCV 79.5* 80.3  PLT 149* 155    LFT Recent Labs  Lab 02/14/22 1309 02/15/22 0547  AST 16 15  ALT 22 19  ALKPHOS 117 112  BILITOT 0.9 0.7  PROT 5.8* 6.1*  ALBUMIN 3.0* 2.9*     Antibiotics: Anti-infectives (From admission, onward)    None        DVT prophylaxis: Heparin  Code Status: Full code  Family Communication: No family at  bedside   CONSULTS    Objective    Physical Examination:  General-appears in no acute distress Heart-S1-S2, regular, no murmur auscultated Lungs-clear to auscultation bilaterally, no wheezing or crackles auscultated Abdomen-soft, nontender, no organomegaly Extremities-bilateral 1+  edema in the lower extremities Neuro-alert, oriented x3, no focal deficit noted  Status is: Inpatient:          [place]   [facility] If 7PM-7AM, please contact night-coverage at [email or web address], Office  [phone]   02/16/2022, 11:40 AM  LOS: 1 day

## 2022-02-16 NOTE — Progress Notes (Signed)
Progress Note  Patient Name: Jacob Silva Date of Encounter: 02/16/2022  [facility] Cardiologist: Verdis Prime in [place]   Subjective   88 year old gentleman with a history of coronary artery disease, status post CABG.  He has a history of CKD, COPD, diabetes, hypertension, hyperlipidemia, obstructive sleep apnea, pulmonary fibrosis.  He has chronic dyspnea.  Was admitted with worsening shortness of breath.  We are asked to see him for further evaluation.  Was admitted June 18 with worsening dyspnea.  Echocardiogram reveals normal left ventricular systolic function.  He has severe RV dysfunction with an estimated right ventricular pressure of 69 mmHg.  He was diuresed.  His discharge weight on June 24 was 210 pounds.  He went out of town and visited family for several weeks.  He developed lots of leg swelling.  He finally got a scale through the [place] system and his weight was found to be 230 once pounds.  His wife brought him to the emergency room based on this weight gain.  He has diuresed 3.7 L so far during this hospitalization. His creatinine has been fairly stable around 2.5.  He continues to gradually improve   Inpatient Medications    Scheduled Meds:  aspirin EC  81 mg Oral Daily   atorvastatin  40 mg Oral QHS   carvedilol  6.25 mg Oral BID WC   furosemide  60 mg Intravenous Q12H   heparin  5,000 Units Subcutaneous Q8H   insulin aspart  0-5 Units Subcutaneous QHS   insulin aspart  0-9 Units Subcutaneous TID WC   insulin glargine-yfgn  10 Units Subcutaneous QHS   isosorbide mononitrate  30 mg Oral Daily   levothyroxine  100 mcg Oral q morning   omeprazole  20 mg Oral Daily   Continuous Infusions:  PRN Meds: acetaminophen **OR** acetaminophen, ondansetron **OR** ondansetron (ZOFRAN) IV   Vital Signs    Vitals:   02/16/22 0000 02/16/22 0423 02/16/22 0900 02/16/22 0918  BP: 112/85 (!) 99/55 (!) 97/54 (!) 100/52  Pulse: 67 69 69   Resp:   16   Temp:  98.6 F (37 C) 98.5 F (36.9 C) 98.2 F (36.8 C)   TempSrc: Oral Oral Oral   SpO2: 90% 91% 98%   Weight:  99.9 kg    Height:        Intake/Output Summary (Last 24 hours) at 02/16/2022 1243 Last data filed at 02/16/2022 1100 Gross per 24 hour  Intake 200 ml  Output 2550 ml  Net -2350 ml      02/16/2022    4:23 AM 02/15/2022    4:09 PM 02/14/2022    7:22 PM  Last 3 Weights  Weight (lbs) 220 lb 3.2 oz 224 lb 6.9 oz 231 lb  Weight (kg) 99.882 kg 101.8 kg 104.781 kg      Telemetry    Atrial fib  - Personally Reviewed  ECG     - Personally Reviewed  Physical Exam   GEN: No acute distress.   Neck: No JVD Cardiac: irregl. irreg Respiratory: bilat fine rales.  GI: Soft, nontender, non-distended  MS: No edema; No deformity. Neuro:  Nonfocal  Psych: Normal affect   Labs    High Sensitivity Troponin:  No results for input(s): "TROPONINIHS" in the last 720 hours.   Chemistry Recent Labs  Lab 02/14/22 1309 02/15/22 0547 02/16/22 0253  NA 140 143 144  K 4.6 4.3 4.0  CL 103 107 105  CO2 26 30 26  GLUCOSE 127* 119* 97  BUN 33* 35* 33*  CREATININE 2.93* 2.76* 2.53*  CALCIUM 8.8* 8.8* 9.0  MG 2.0 2.0  --   PROT 5.8* 6.1*  --   ALBUMIN 3.0* 2.9*  --   AST 16 15  --   ALT 22 19  --   ALKPHOS 117 112  --   BILITOT 0.9 0.7  --   GFRNONAA 20* 21* 24*  ANIONGAP 11 6 13     Lipids No results for input(s): "CHOL", "TRIG", "HDL", "LABVLDL", "LDLCALC", "CHOLHDL" in the last 168 hours.  Hematology Recent Labs  Lab 02/14/22 1309 02/15/22 0547  WBC 10.4 10.9*  RBC 4.54 4.47  HGB 11.7* 11.4*  HCT 36.1* 35.9*  MCV 79.5* 80.3  MCH 25.8* 25.5*  MCHC 32.4 31.8  RDW 18.1* 18.1*  PLT 149* 155   Thyroid No results for input(s): "TSH", "FREET4" in the last 168 hours.  BNP Recent Labs  Lab 02/14/22 1309  BNP 472.4*    DDimer No results for input(s): "DDIMER" in the last 168 hours.   Radiology    US Renal  Result Date: 02/14/2022 CLINICAL DATA:  Renal failure  EXAM: RENAL / URINARY TRACT ULTRASOUND COMPLETE COMPARISON:  None Available. FINDINGS: Right Kidney: Renal measurements: 8.5 x 4.6 x 5.3 cm = volume: 106.6 mL. Normal renal echotexture. No hydronephrosis. There are 2 simple appearing right renal cysts, measuring up to 4.9 and 3.0 cm each. Left Kidney: Renal measurements: 10.6 x 5.8 by 5.7 cm = volume: 181.4 mL. Normal renal echotexture. No hydronephrosis. There are 2 simple appearing left renal cysts, measuring up to 2.3 and 2.0 cm each. Bladder: Appears normal for degree of bladder distention. Other: None. IMPRESSION: 1. Simple appearing bilateral renal cysts. 2. Otherwise unremarkable renal ultrasound. Electronically Signed   By: Randa Ngo M.D.   On: 02/14/2022 21:11   DG Chest 2 View  Result Date: 02/14/2022 CLINICAL DATA:  Dyspnea. EXAM: CHEST - 2 VIEW COMPARISON:  January 13, 2022. FINDINGS: Stable cardiomegaly. Status post coronary bypass graft. Bilateral perihilar and basilar interstitial densities are noted most consistent with pulmonary edema. Bony thorax is unremarkable. IMPRESSION: Bilateral interstitial densities are noted most consistent with pulmonary edema. Electronically Signed   By: Marijo Conception M.D.   On: 02/14/2022 13:20    Cardiac Studies      Patient Profile     88 y.o. male    Assessment & Plan       RV failure:   has severe RV dysfunction .   Has diuresed well.   He avoids salt .   Will continue meds.  2.   COPD :     3.  Hyperlidemia :    cont meds.   For questions or updates, please contact [place] Please consult [email or web address] for contact info under        Signed, Mertie Moores, MD  02/16/2022, 12:43 PM

## 2022-02-16 NOTE — Progress Notes (Signed)
RT went to place pt on night time CPAP, pt refused. Pts family member stated she will bring pts home equipment tomorrow, pt is currently on [place] at this time. RT will continue to monitor.

## 2022-02-17 DIAGNOSIS — N1832 Chronic kidney disease, stage 3b: Secondary | ICD-10-CM | POA: Diagnosis not present

## 2022-02-17 DIAGNOSIS — I509 Heart failure, unspecified: Secondary | ICD-10-CM | POA: Diagnosis not present

## 2022-02-17 DIAGNOSIS — N179 Acute kidney failure, unspecified: Secondary | ICD-10-CM | POA: Diagnosis not present

## 2022-02-17 DIAGNOSIS — I5082 Biventricular heart failure: Secondary | ICD-10-CM | POA: Diagnosis not present

## 2022-02-17 LAB — BASIC METABOLIC PANEL
Anion gap: 7 (ref 5–15)
BUN: 29 mg/dL — ABNORMAL HIGH (ref 8–23)
CO2: 27 mmol/L (ref 22–32)
Calcium: 8.6 mg/dL — ABNORMAL LOW (ref 8.9–10.3)
Chloride: 110 mmol/L (ref 98–111)
Creatinine, Ser: 2.45 mg/dL — ABNORMAL HIGH (ref 0.61–1.24)
GFR, Estimated: 25 mL/min — ABNORMAL LOW (ref >60)
Glucose, Bld: 74 mg/dL (ref 70–99)
Potassium: 3.8 mmol/L (ref 3.5–5.1)
Sodium: 144 mmol/L (ref 135–145)

## 2022-02-17 LAB — GLUCOSE, CAPILLARY
Glucose-Capillary: 74 mg/dL (ref 70–99)
Glucose-Capillary: 115 mg/dL — ABNORMAL HIGH (ref 70–99)
Glucose-Capillary: 146 mg/dL — ABNORMAL HIGH (ref 70–99)
Glucose-Capillary: 166 mg/dL — ABNORMAL HIGH (ref 70–99)

## 2022-02-17 MED ORDER — ALLOPURINOL 100 MG PO TABS
100.0000 mg | ORAL_TABLET | Freq: Every day | ORAL | Status: DC
Start: 2022-02-17 — End: 2022-02-20
  Administered 2022-02-17 – 2022-02-19 (×3): 100 mg via ORAL
  Filled 2022-02-17 (×3): qty 1

## 2022-02-17 MED ORDER — COLCHICINE 0.6 MG PO TABS
0.6000 mg | ORAL_TABLET | Freq: Once | ORAL | Status: AC
  Administered 2022-02-17: 0.6 mg via ORAL
  Filled 2022-02-17: qty 1

## 2022-02-17 NOTE — Plan of Care (Signed)
  Problem: Education: Goal: Ability to demonstrate management of disease process will improve Outcome: Progressing   Problem: Activity: Goal: Capacity to carry out activities will improve Outcome: Progressing   Problem: Cardiac: Goal: Ability to achieve and maintain adequate cardiopulmonary perfusion will improve Outcome: Progressing   Problem: Coping: Goal: Ability to adjust to condition or change in health will improve Outcome: Progressing   Problem: Fluid Volume: Goal: Ability to maintain a balanced intake and output will improve Outcome: Progressing   Problem: Nutritional: Goal: Maintenance of adequate nutrition will improve Outcome: Progressing

## 2022-02-17 NOTE — Progress Notes (Signed)
Mobility Specialist Progress Note    02/17/22 1514  Mobility  Activity Contraindicated/medical hold   Pt c/o a lot of foot pain. Will f/u as appropriate.   [name] Mobility Specialist

## 2022-02-17 NOTE — Progress Notes (Signed)
Pt/Pt family placed pt on home CPAP unit. RT assisted with O2 extension tubing to bleed in 2L. Vitals stable.

## 2022-02-17 NOTE — Progress Notes (Signed)
[facility]  PROGRESS NOTE  Jacob Silva UXN:[id number] DOB: Feb 28, 1932 DOA: 02/14/2022 PCP: [name], MD   Brief HPI:    88 year old African-American male with history of biventricular heart failure, CKD stage IIIb, baseline creatinine to 2.3, chronic respiratory failure with hypoxemia on oxygen 2 L/min, diabetes mellitus type 2, CAD, hypertension came to ED with worsening lower extremity edema, shortness of breath and weight gain.  Patient wife says that after he was discharged from the hospital he weighed 210 pounds over the past 2 weeks he is increasingly gaining weight and he weighed 232 pounds at home.  His cardiologist had increased Lasix to 60 mg daily. Over past few days wife noted that he was having increasing lower extremity edema.  Patient's echocardiogram during last hospitalization showed EF of 55 to 60%.  Right ventricle function was severely reduced.  Chest x-ray in the ED showed pulmonary edema.  Patient started on Lasix 60 mg IV twice daily.    Subjective   Patient seen and examined, diuresing well with IV Lasix.  Leg swelling has gone down.   Assessment/Plan:   Acute on chronic biventricular heart failure -Presented with worsening edema, weight gain; secondary to both left and right heart failure -Patient started on Lasix 60 mg IV every 12 hours -Also started on Diamox; blood pressure is soft, will hold Diamox at this time -Breathing has improved, leg swelling also improved. -Strict intake and output -Daily weight monitoring -Cardiology following  Acute kidney injury superimposed on CKD stage IIIb -Secondary to left ventricular dysfunction -Creatinine improving with diuresis; today creatinine is down to 2.45 -Continue IV Lasix  Acute on chronic hypoxemic respiratory failure -Continue supplemental oxygen  -Patient is on 2 L/minute oxygen at home   Hypothyroidism -Continue Synthroid 100 mcg daily  Hypertension Continue Coreg 6.25 twice  daily -ARB on hold due to acute kidney injury  Diabetes mellitus type 2 -CBG well controlled -Continue Lantus -Sliding scale insulin NovoLog   Medications     aspirin EC  81 mg Oral Daily   atorvastatin  40 mg Oral QHS   carvedilol  6.25 mg Oral BID WC   furosemide  60 mg Intravenous Q12H   heparin  5,000 Units Subcutaneous Q8H   insulin aspart  0-5 Units Subcutaneous QHS   insulin aspart  0-9 Units Subcutaneous TID WC   insulin glargine-yfgn  10 Units Subcutaneous QHS   isosorbide mononitrate  30 mg Oral Daily   levothyroxine  100 mcg Oral q morning   omeprazole  20 mg Oral Daily     Data Reviewed:   CBG:  Recent Labs  Lab 02/16/22 1134 02/16/22 1620 02/16/22 2150 02/17/22 0630 02/17/22 1143  GLUCAP 163* 128* 90 74 115*    SpO2: 94 % O2 Flow Rate (L/min): 3 L/min    Vitals:   02/16/22 0918 02/16/22 2006 02/17/22 0423 02/17/22 1145  BP: (!) 100/52 116/65 (!) 102/58 (!) 108/56  Pulse:  (!) 54 69 63  Resp:  20  19  Temp:  98.5 F (36.9 C) 98.6 F (37 C) 98.1 F (36.7 C)  TempSrc:  Oral Oral Oral  SpO2:  94% 92% 94%  Weight:   100.1 kg   Height:          Data Reviewed:  Basic Metabolic Panel: Recent Labs  Lab 02/14/22 1309 02/15/22 0547 02/16/22 0253 02/17/22 0446  NA 140 143 144 144  K 4.6 4.3 4.0 3.8  CL 103 107 105 110  CO2  26 30 26 27   GLUCOSE 127* 119* 97 74  BUN 33* 35* 33* 29*  CREATININE 2.93* 2.76* 2.53* 2.45*  CALCIUM 8.8* 8.8* 9.0 8.6*  MG 2.0 2.0  --   --     CBC: Recent Labs  Lab 02/14/22 1309 02/15/22 0547  WBC 10.4 10.9*  NEUTROABS 7.9* 7.8*  HGB 11.7* 11.4*  HCT 36.1* 35.9*  MCV 79.5* 80.3  PLT 149* 155    LFT Recent Labs  Lab 02/14/22 1309 02/15/22 0547  AST 16 15  ALT 22 19  ALKPHOS 117 112  BILITOT 0.9 0.7  PROT 5.8* 6.1*  ALBUMIN 3.0* 2.9*     Antibiotics: Anti-infectives (From admission, onward)    None        DVT prophylaxis: Heparin  Code Status: DNR  Family Communication:  Discussed with wife at bedside   CONSULTS    Objective    Physical Examination:  General-appears in no acute distress Heart-S1-S2, regular, no murmur auscultated Lungs-bibasilar crackles auscultated Abdomen-soft, nontender, no organomegaly Extremities-bilateral trace  edema in the lower extremities Neuro-alert, oriented x3, no focal deficit noted Status is: Inpatient:          [place]   [facility] If 7PM-7AM, please contact night-coverage at [email or web address], Office  [phone]   02/17/2022, 2:11 PM  LOS: 2 days

## 2022-02-17 NOTE — Progress Notes (Signed)
Progress Note  Patient Name: Jacob Silva Date of Encounter: 02/17/2022  [facility] Cardiologist: Verdis Prime in [place]   Subjective   88 year old gentleman with a history of coronary artery disease, status post CABG.  He has a history of CKD, COPD, diabetes, hypertension, hyperlipidemia, obstructive sleep apnea, pulmonary fibrosis.  He has chronic dyspnea.  Was admitted with worsening shortness of breath.  We are asked to see him for further evaluation.  Was admitted June 18 with worsening dyspnea.  Echocardiogram reveals normal left ventricular systolic function.  He has severe RV dysfunction with an estimated right ventricular pressure of 69 mmHg.  He was diuresed.  His discharge weight on June 24 was 210 pounds.  He went out of town and visited family for several weeks.  He developed lots of leg swelling.  He finally got a scale through the [place] system and his weight was found to be 230 once pounds.  His wife brought him to the emergency room based on this weight gain.  He has diuresed 6.3 liters so far this admission   He continues to gradually improve  Still sleepy    Inpatient Medications    Scheduled Meds:  aspirin EC  81 mg Oral Daily   atorvastatin  40 mg Oral QHS   carvedilol  6.25 mg Oral BID WC   furosemide  60 mg Intravenous Q12H   heparin  5,000 Units Subcutaneous Q8H   insulin aspart  0-5 Units Subcutaneous QHS   insulin aspart  0-9 Units Subcutaneous TID WC   insulin glargine-yfgn  10 Units Subcutaneous QHS   isosorbide mononitrate  30 mg Oral Daily   levothyroxine  100 mcg Oral q morning   omeprazole  20 mg Oral Daily   Continuous Infusions:  PRN Meds: acetaminophen **OR** acetaminophen, ondansetron **OR** ondansetron (ZOFRAN) IV   Vital Signs    Vitals:   02/16/22 0918 02/16/22 2006 02/17/22 0423 02/17/22 1145  BP: (!) 100/52 116/65 (!) 102/58 (!) 108/56  Pulse:  (!) 54 69 63  Resp:  20  19  Temp:  98.5 F (36.9 C) 98.6 F (37  C) 98.1 F (36.7 C)  TempSrc:  Oral Oral Oral  SpO2:  94% 92% 94%  Weight:   100.1 kg   Height:        Intake/Output Summary (Last 24 hours) at 02/17/2022 1403 Last data filed at 02/17/2022 1100 Gross per 24 hour  Intake 120 ml  Output 2700 ml  Net -2580 ml       02/17/2022    4:23 AM 02/16/2022    4:23 AM 02/15/2022    4:09 PM  Last 3 Weights  Weight (lbs) 220 lb 10.9 oz 220 lb 3.2 oz 224 lb 6.9 oz  Weight (kg) 100.1 kg 99.882 kg 101.8 kg      Telemetry    Atrial fib  - Personally Reviewed  ECG     - Personally Reviewed  Physical Exam   GEN: No acute distress.   Neck: No JVD Cardiac: irregl. irreg Respiratory: bilat fine rales.  GI: Soft, nontender, non-distended  MS: No edema; No deformity. Neuro:  Nonfocal  Psych: Normal affect   Labs    High Sensitivity Troponin:  No results for input(s): "TROPONINIHS" in the last 720 hours.   Chemistry Recent Labs  Lab 02/14/22 1309 02/15/22 0547 02/16/22 0253 02/17/22 0446  NA 140 143 144 144  K 4.6 4.3 4.0 3.8  CL 103 107 105 110  CO2 26 30 26 27   GLUCOSE 127* 119* 97 74  BUN 33* 35* 33* 29*  CREATININE 2.93* 2.76* 2.53* 2.45*  CALCIUM 8.8* 8.8* 9.0 8.6*  MG 2.0 2.0  --   --   PROT 5.8* 6.1*  --   --   ALBUMIN 3.0* 2.9*  --   --   AST 16 15  --   --   ALT 22 19  --   --   ALKPHOS 117 112  --   --   BILITOT 0.9 0.7  --   --   GFRNONAA 20* 21* 24* 25*  ANIONGAP 11 6 13 7      Lipids No results for input(s): "CHOL", "TRIG", "HDL", "LABVLDL", "LDLCALC", "CHOLHDL" in the last 168 hours.  Hematology Recent Labs  Lab 02/14/22 1309 02/15/22 0547  WBC 10.4 10.9*  RBC 4.54 4.47  HGB 11.7* 11.4*  HCT 36.1* 35.9*  MCV 79.5* 80.3  MCH 25.8* 25.5*  MCHC 32.4 31.8  RDW 18.1* 18.1*  PLT 149* 155    Thyroid No results for input(s): "TSH", "FREET4" in the last 168 hours.  BNP Recent Labs  Lab 02/14/22 1309  BNP 472.4*     DDimer No results for input(s): "DDIMER" in the last 168 hours.   Radiology     No results found.  Cardiac Studies      Patient Profile     88 y.o. male    Assessment & Plan       RV failure:   has severe RV dysfunction .   Continues to diurese well. He may do better on PO Torsemide instead of lasix   He will follow up with Dr Humphrey Rolls upon DC   2.   COPD :     3.  Hyperlidemia :    cont meds.   For questions or updates, please contact [place] Please consult [email or web address] for contact info under        Signed, Mertie Moores, MD  02/17/2022, 2:03 PM

## 2022-02-18 DIAGNOSIS — I509 Heart failure, unspecified: Secondary | ICD-10-CM | POA: Diagnosis not present

## 2022-02-18 DIAGNOSIS — I5082 Biventricular heart failure: Secondary | ICD-10-CM | POA: Diagnosis not present

## 2022-02-18 LAB — BASIC METABOLIC PANEL
Anion gap: 10 (ref 5–15)
BUN: 26 mg/dL — ABNORMAL HIGH (ref 8–23)
CO2: 25 mmol/L (ref 22–32)
Calcium: 8.9 mg/dL (ref 8.9–10.3)
Chloride: 107 mmol/L (ref 98–111)
Creatinine, Ser: 2.26 mg/dL — ABNORMAL HIGH (ref 0.61–1.24)
GFR, Estimated: 27 mL/min — ABNORMAL LOW (ref >60)
Glucose, Bld: 110 mg/dL — ABNORMAL HIGH (ref 70–99)
Potassium: 3.8 mmol/L (ref 3.5–5.1)
Sodium: 142 mmol/L (ref 135–145)

## 2022-02-18 LAB — GLUCOSE, CAPILLARY
Glucose-Capillary: 103 mg/dL — ABNORMAL HIGH (ref 70–99)
Glucose-Capillary: 145 mg/dL — ABNORMAL HIGH (ref 70–99)
Glucose-Capillary: 115 mg/dL — ABNORMAL HIGH (ref 70–99)
Glucose-Capillary: 137 mg/dL — ABNORMAL HIGH (ref 70–99)

## 2022-02-18 MED ORDER — GUAIFENESIN ER 600 MG PO TB12
600.0000 mg | ORAL_TABLET | Freq: Two times a day (BID) | ORAL | Status: DC
  Administered 2022-02-18 – 2022-02-19 (×3): 600 mg via ORAL
  Filled 2022-02-18 (×3): qty 1

## 2022-02-18 MED ORDER — ALBUTEROL SULFATE (2.5 MG/3ML) 0.083% IN NEBU: 2.5000 mg | INHALATION_SOLUTION | RESPIRATORY_TRACT | Status: DC | PRN

## 2022-02-18 MED ORDER — ARFORMOTEROL TARTRATE 15 MCG/2ML IN NEBU
15.0000 ug | INHALATION_SOLUTION | Freq: Two times a day (BID) | RESPIRATORY_TRACT | Status: DC
  Administered 2022-02-18 – 2022-02-19 (×2): 15 ug via RESPIRATORY_TRACT
  Filled 2022-02-18 (×2): qty 2

## 2022-02-18 NOTE — Evaluation (Signed)
Physical Therapy Evaluation Patient Details Name: Jacob Silva MRN: [id number] DOB: 24-Jul-1932 Today's Date: 02/18/2022  History of Present Illness  Pt is an 88 y.o. male who presented 02/14/22 witn increasing SOB, LE edema and weight gain. Chest xray showed pulmonary edema.  PMH: CAD s/p CABG, CKD stage 3b, DM, HTN, HLD, CHF, COPD/pulmonary fibrosis on 2L O2, CHF, sleep apnea on CPAP, anemia, NSTEMI 6/23.  Clinical Impression  Pt admitted with above diagnosis. Pt received in bed, wife present, she reports edema in LE's is decreasing. Compression socks and slippers donned for ambulation. Pt ambulated with RW on 2L O2 with min A. SPO2 dropped to 85% and pt SOB. O2 increased to 3L for last 40' of ambulation. SPO2 89%. After rest, SPO2 96% on 2L O2. Will follow acutely but do not anticipate pt having PT needs after d/c.   Pt currently with functional limitations due to the deficits listed below (see PT Problem List). Pt will benefit from skilled PT to increase their independence and safety with mobility to allow discharge to the venue listed below.          Recommendations for follow up therapy are one component of a multi-disciplinary discharge planning process, led by the attending physician.  Recommendations may be updated based on patient status, additional functional criteria and insurance authorization.  Follow Up Recommendations No PT follow up      Assistance Recommended at Discharge Intermittent Supervision/Assistance  Patient can return home with the following  A little help with walking and/or transfers;A little help with bathing/dressing/bathroom;Assistance with cooking/housework;Assist for transportation;Help with stairs or ramp for entrance    Equipment Recommendations None recommended by PT  Recommendations for Other Services       Functional Status Assessment Patient has had a recent decline in their functional status and demonstrates the ability to make significant  improvements in function in a reasonable and predictable amount of time.     Precautions / Restrictions Precautions Precautions: Fall Precaution Comments: shuffles feet Restrictions Weight Bearing Restrictions: No      Mobility  Bed Mobility Overal bed mobility: Needs Assistance Bed Mobility: Supine to Sit, Sit to Supine     Supine to sit: Min assist, HOB elevated Sit to supine: Min assist, HOB elevated   General bed mobility comments: slow mvmts, min A for safety with HOB elevated and use of rail    Transfers Overall transfer level: Needs assistance Equipment used: Rolling walker (2 wheels) Transfers: Sit to/from Stand Sit to Stand: Min assist           General transfer comment: min A to steady, does not usually use RW in home but this was pt's first time up so RW used and pt reliant upon    Ambulation/Gait Ambulation/Gait assistance: Min Web designer (Feet): 80 Feet Assistive device: Rolling walker (2 wheels) Gait Pattern/deviations: Step-through pattern, Shuffle Gait velocity: decreased Gait velocity interpretation: <1.8 ft/sec, indicate of risk for recurrent falls   General Gait Details: very low step height, pt's wife reports she reminds him to pick feet up at home. Pt SOB at 40' with SPO2 85% on 2L. O2 turned up to 3L O2.  Stairs            Wheelchair Mobility    Modified Rankin (Stroke Patients Only)       Balance Overall balance assessment: Needs assistance Sitting-balance support: Single extremity supported, Feet supported Sitting balance-Leahy Scale: Fair     Standing balance support: Bilateral upper extremity supported,  During functional activity Standing balance-Leahy Scale: Poor Standing balance comment: needed UE support today                             Pertinent Vitals/Pain Pain Assessment Pain Assessment: No/denies pain    Home Living Family/patient expects to be discharged to:: Private residence Living  Arrangements: Spouse/significant other Available Help at Discharge: Family;Available 24 hours/day Type of Home: House Home Access: Ramped entrance       Home Layout: One level Home Equipment: Shower seat - built in;Grab bars - tub/shower;Grab bars - toilet;Electric scooter;Wheelchair - manual;Rollator (4 wheels);BSC/3in1;Cane - single point;Other (comment) (adjustable bed) Additional Comments: 2L O2 baseline    Prior Function Prior Level of Function : Needs assist             Mobility Comments: Pt mod I for mobility using bed controls and rollator/electric scooter outside the home but no UE support inside the home. x1 fall in last 6 months. ADLs Comments: Intermittently requires assistance for shoes/socks     Hand Dominance        Extremity/Trunk Assessment   Upper Extremity Assessment Upper Extremity Assessment: Generalized weakness    Lower Extremity Assessment Lower Extremity Assessment: Generalized weakness    Cervical / Trunk Assessment Cervical / Trunk Assessment: Kyphotic  Communication   Communication: HOH  Cognition Arousal/Alertness: Awake/alert Behavior During Therapy: WFL for tasks assessed/performed Overall Cognitive Status: Within Functional Limits for tasks assessed                                 General Comments: pt very HOH but conversed appropriately, lets wife speak for him often        General Comments General comments (skin integrity, edema, etc.): SPO2 95% 89% on 3L O2 after ambulation, O2 turned back down to 2L at rest and SPO2 95%. HR and BP stable.    Exercises     Assessment/Plan    PT Assessment Patient needs continued PT services  PT Problem List Decreased strength;Decreased activity tolerance;Decreased mobility;Cardiopulmonary status limiting activity       PT Treatment Interventions DME instruction;Gait training;Functional mobility training;Therapeutic activities;Therapeutic exercise;Balance  training;Patient/family education    PT Goals (Current goals can be found in the Care Plan section)  Acute Rehab PT Goals Patient Stated Goal: return home PT Goal Formulation: With patient Time For Goal Achievement: 03/04/22 Potential to Achieve Goals: Good    Frequency Min 3X/week     Co-evaluation               AM-PAC PT "6 Clicks" Mobility  Outcome Measure Help needed turning from your back to your side while in a flat bed without using bedrails?: A Little Help needed moving from lying on your back to sitting on the side of a flat bed without using bedrails?: A Little Help needed moving to and from a bed to a chair (including a wheelchair)?: A Little Help needed standing up from a chair using your arms (e.g., wheelchair or bedside chair)?: A Little Help needed to walk in hospital room?: A Little Help needed climbing 3-5 steps with a railing? : A Lot 6 Click Score: 17    End of Session Equipment Utilized During Treatment: Gait belt;Oxygen Activity Tolerance: Patient tolerated treatment well Patient left: in bed;with call bell/phone within reach;with family/visitor present Nurse Communication: Mobility status PT Visit Diagnosis: History of falling (Z91.81);Difficulty  in walking, not elsewhere classified (R26.2);Muscle weakness (generalized) (M62.81)    Time: 3524-8185 PT Time Calculation (min) (ACUTE ONLY): 27 min   Charges:   PT Evaluation $PT Eval Moderate Complexity: 1 Mod PT Treatments $Gait Training: 8-22 mins        Leighton Roach, PT  Acute Rehab Services Secure chat preferred Office [place] 02/18/2022, 1:11 PM

## 2022-02-18 NOTE — Progress Notes (Signed)
OT Cancellation Note  Patient Details Name: Jacob Silva MRN: [id number] DOB: 1931-10-23   Cancelled Treatment:    Reason Eval/Treat Not Completed: Other (comment) (Pt working with PT. OT to come back later today for OT eval as schedule allows.)  Jefferey Pica, OTR/L Acute Rehabilitation Services Office: [phone]   Jefferey Pica 02/18/2022, 12:14 PM

## 2022-02-18 NOTE — Evaluation (Signed)
Occupational Therapy Evaluation Patient Details Name: Jacob Silva MRN: [id number] DOB: [birth year] Today's Date: 02/18/2022   History of Present Illness Pt is an 88 y.o. male who presented 02/14/22 witn increasing SOB, LE edema and weight gain. Chest xray showed pulmonary edema.  PMH: CAD s/p CABG, CKD stage 3b, DM, HTN, HLD, CHF, COPD/pulmonary fibrosis on 2L O2, CHF, sleep apnea on CPAP, anemia, NSTEMI 6/23.   Clinical Impression   Pt PTA: Pt living with spouse and reports independence with mobility in home. Pt currently, limited by SOB, but completing x10 mins of OOB ADL tasks in standing at sink with no LOB episodes. Pt set-upA to minguardA overall for ADL tasks. Pt's O2 on 2L  >87% O2 with exertion and quickly resumes >90% O2.  HR 60-80BPM. Pt would benefit from continued OT skilled services. OT following acutely. (Spouse noticed that pt is becomnig more tired easily at home and would like HH to improve pt's activity tolerance/endurance).     Recommendations for follow up therapy are one component of a multi-disciplinary discharge planning process, led by the attending physician.  Recommendations may be updated based on patient status, additional functional criteria and insurance authorization.   Follow Up Recommendations  Home health OT    Assistance Recommended at Discharge Set up Supervision/Assistance  Patient can return home with the following A little help with walking and/or transfers;A little help with bathing/dressing/bathroom;Assist for transportation;Help with stairs or ramp for entrance    Functional Status Assessment  Patient has had a recent decline in their functional status and demonstrates the ability to make significant improvements in function in a reasonable and predictable amount of time.  Equipment Recommendations  None recommended by OT    Recommendations for Other Services       Precautions / Restrictions Precautions Precautions: Fall Precaution  Comments: shuffles feet Restrictions Weight Bearing Restrictions: No      Mobility Bed Mobility Overal bed mobility: Needs Assistance Bed Mobility: Supine to Sit, Sit to Supine     Supine to sit: Min assist, HOB elevated Sit to supine: Min assist, HOB elevated   General bed mobility comments: slow mvmts, min A for safety with HOB elevated and use of rail    Transfers Overall transfer level: Needs assistance Equipment used: Rolling walker (2 wheels) Transfers: Sit to/from Stand Sit to Stand: Min assist           General transfer comment: min A to steady, does not usually use RW in home but this was pt's first time up so RW used and pt reliant upon      Balance Overall balance assessment: Needs assistance Sitting-balance support: Single extremity supported, Feet supported Sitting balance-Leahy Scale: Fair     Standing balance support: Bilateral upper extremity supported, During functional activity Standing balance-Leahy Scale: Poor Standing balance comment: needed UE support today                           ADL either performed or assessed with clinical judgement   ADL Overall ADL's : Needs assistance/impaired Eating/Feeding: Set up;Sitting   Grooming: Set up;Standing;Wash/dry face Grooming Details (indicate cue type and reason): standing for washing fasce and shaving Upper Body Bathing: Set up;Sitting   Lower Body Bathing: Min guard;Cueing for safety;Sitting/lateral leans;Sit to/from stand   Upper Body Dressing : Set up;Sitting;Standing   Lower Body Dressing: Min guard;Sitting/lateral leans;Sit to/from stand   Toilet Transfer: Min guard;Ambulation;Rolling walker (2 wheels)   Toileting- Clothing  Manipulation and Hygiene: Min guard;Sitting/lateral lean;Sit to/from stand;Cueing for safety       Functional mobility during ADLs: Min guard;Cueing for safety;Rolling walker (2 wheels) General ADL Comments: Pt limited by SOB, but completing x10 mins of  OOB ADL tasks in standing at sink with no LOB episodes. Pt's O2 on 2L  >87% O2 with exertion and quickly resumes >90% O2.     Vision Baseline Vision/History: 1 Wears glasses Ability to See in Adequate Light: 0 Adequate Patient Visual Report: No change from baseline Vision Assessment?: No apparent visual deficits     Perception     Praxis      Pertinent Vitals/Pain Pain Assessment Pain Assessment: No/denies pain     Hand Dominance Right   Extremity/Trunk Assessment Upper Extremity Assessment Upper Extremity Assessment: Generalized weakness   Lower Extremity Assessment Lower Extremity Assessment: Generalized weakness   Cervical / Trunk Assessment Cervical / Trunk Assessment: Kyphotic   Communication Communication Communication: HOH   Cognition Arousal/Alertness: Awake/alert Behavior During Therapy: WFL for tasks assessed/performed Overall Cognitive Status: Within Functional Limits for tasks assessed                                 General Comments: Answers appropriately; talk slow and he will understand     General Comments  O2 >987% on 2L at sink with exertion; HR 60-80BPM.    Exercises     Shoulder Instructions      Home Living Family/patient expects to be discharged to:: Private residence Living Arrangements: Spouse/significant other Available Help at Discharge: Family;Available 24 hours/day Type of Home: House Home Access: Ramped entrance     Home Layout: One level     Bathroom Shower/Tub: Occupational psychologist: Handicapped height Bathroom Accessibility: Yes   Home Equipment: Shower seat - built in;Grab bars - tub/shower;Grab bars - toilet;Electric scooter;Wheelchair - manual;Rollator (4 wheels);BSC/3in1;Cane - single point;Other (comment) (adjustable bed)   Additional Comments: 2L O2 baseline      Prior Functioning/Environment Prior Level of Function : Needs assist             Mobility Comments: Pt mod I for  mobility using bed controls and rollator/electric scooter outside the home but no UE support inside the home. x1 fall in last 6 months. ADLs Comments: Intermittently requires assistance for shoes/socks        OT Problem List: Decreased activity tolerance;Cardiopulmonary status limiting activity      OT Treatment/Interventions: Self-care/ADL training;Therapeutic exercise;Energy conservation;DME and/or AE instruction;Therapeutic activities;Patient/family education;Balance training    OT Goals(Current goals can be found in the care plan section) Acute Rehab OT Goals Patient Stated Goal: to go home OT Goal Formulation: With patient Time For Goal Achievement: 03/04/22 Potential to Achieve Goals: Good ADL Goals Additional ADL Goal #1: Pt will perform >12 mins of OOB ADL in standing with O2 >90% in order to increase activity tolerance. Additional ADL Goal #2: Pt will state 3 energy conservation techniques for OOB ADL tasks.  OT Frequency: Min 2X/week    Co-evaluation              AM-PAC OT "6 Clicks" Daily Activity     Outcome Measure Help from another person eating meals?: None Help from another person taking care of personal grooming?: A Little Help from another person toileting, which includes using toliet, bedpan, or urinal?: A Little Help from another person bathing (including washing, rinsing, drying)?: A Little Help  from another person to put on and taking off regular upper body clothing?: None Help from another person to put on and taking off regular lower body clothing?: A Little 6 Click Score: 20   End of Session Equipment Utilized During Treatment: Rolling walker (2 wheels);Oxygen Nurse Communication: Mobility status  Activity Tolerance: Patient tolerated treatment well Patient left: in bed;with call bell/phone within reach;with family/visitor present  OT Visit Diagnosis: Unsteadiness on feet (R26.81);Muscle weakness (generalized) (M62.81)                Time:  1358-1430 OT Time Calculation (min): 32 min Charges:  OT General Charges $OT Visit: 1 Visit OT Evaluation $OT Eval Moderate Complexity: 1 Mod OT Treatments $Self Care/Home Management : 8-22 mins  Jefferey Pica, OTR/L Acute Rehabilitation Services Office: [phone]   [id number]  02/18/2022, 2:36 PM

## 2022-02-18 NOTE — Progress Notes (Signed)
Mobility Specialist Progress Note:   02/18/22 1742  Mobility  Activity Ambulated with assistance in hallway  Level of Assistance Minimal assist, patient does 75% or more  Assistive Device Front wheel walker  Distance Ambulated (ft) 230 ft  Activity Response Tolerated well  $Mobility charge 1 Mobility   Pt received in bed willing to participate in mobility. No complaints of pain. MinA to stand then contact guard throughout. Left in bed with call bell in reach and all needs met.   [facility] - Dreya [facility] [place] Mobility Specialist

## 2022-02-18 NOTE — Progress Notes (Signed)
Cardiology Progress Note  Patient ID: Jacob Silva MRN: [id number] DOB: 04-Sep-1931 Date of Encounter: 02/18/2022  Primary Cardiologist: None  Subjective   Chief Complaint: SOB  HPI: Good diuresis.  Still a bit volume up.  ROS:  All other ROS reviewed and negative. Pertinent positives noted in the HPI.     Inpatient Medications  Scheduled Meds:  allopurinol  100 mg Oral Daily   aspirin EC  81 mg Oral Daily   atorvastatin  40 mg Oral QHS   carvedilol  6.25 mg Oral BID WC   furosemide  60 mg Intravenous Q12H   guaiFENesin  600 mg Oral BID   heparin  5,000 Units Subcutaneous Q8H   insulin aspart  0-5 Units Subcutaneous QHS   insulin aspart  0-9 Units Subcutaneous TID WC   insulin glargine-yfgn  10 Units Subcutaneous QHS   isosorbide mononitrate  30 mg Oral Daily   levothyroxine  100 mcg Oral q morning   omeprazole  20 mg Oral Daily   Continuous Infusions:  PRN Meds: acetaminophen **OR** acetaminophen, ondansetron **OR** ondansetron (ZOFRAN) IV   Vital Signs   Vitals:   02/17/22 2313 02/18/22 0422 02/18/22 0500 02/18/22 0809  BP: 118/67 (!) 110/57  120/79  Pulse: 69 76  91  Resp: 18 18  18   Temp: 97.8 F (36.6 C) 98.2 F (36.8 C)  98.1 F (36.7 C)  TempSrc: Oral Oral  Oral  SpO2: 94% 94%  94%  Weight:   101 kg   Height:        Intake/Output Summary (Last 24 hours) at 02/18/2022 1042 Last data filed at 02/18/2022 0900 Gross per 24 hour  Intake 520 ml  Output 2650 ml  Net -2130 ml      02/18/2022    5:00 AM 02/17/2022    4:23 AM 02/16/2022    4:23 AM  Last 3 Weights  Weight (lbs) 222 lb 10.6 oz 220 lb 10.9 oz 220 lb 3.2 oz  Weight (kg) 101 kg 100.1 kg 99.882 kg      Telemetry  Overnight telemetry shows sinus rhythm 90s, which I personally reviewed.    Physical Exam   Vitals:   02/17/22 2313 02/18/22 0422 02/18/22 0500 02/18/22 0809  BP: 118/67 (!) 110/57  120/79  Pulse: 69 76  91  Resp: 18 18  18   Temp: 97.8 F (36.6 C) 98.2 F (36.8 C)  98.1  F (36.7 C)  TempSrc: Oral Oral  Oral  SpO2: 94% 94%  94%  Weight:   101 kg   Height:        Intake/Output Summary (Last 24 hours) at 02/18/2022 1042 Last data filed at 02/18/2022 0900 Gross per 24 hour  Intake 520 ml  Output 2650 ml  Net -2130 ml       02/18/2022    5:00 AM 02/17/2022    4:23 AM 02/16/2022    4:23 AM  Last 3 Weights  Weight (lbs) 222 lb 10.6 oz 220 lb 10.9 oz 220 lb 3.2 oz  Weight (kg) 101 kg 100.1 kg 99.882 kg    Body mass index is 31.06 kg/m.  General: Well nourished, well developed, in no acute distress Head: Atraumatic, normal size  Eyes: PEERLA, EOMI  Neck: Supple, JVD 12 to 15 cm of water Endocrine: No thryomegaly Cardiac: Normal S1, S2; RRR; no murmurs, rubs, or gallops Lungs: Clear to auscultation bilaterally, no wheezing, rhonchi or rales  Abd: Soft, nontender, no hepatomegaly  Ext: Trace edema  Musculoskeletal:  No deformities, BUE and BLE strength normal and equal Skin: Warm and dry, no rashes   Neuro: Alert and oriented to person, place, time, and situation, CNII-XII grossly intact, no focal deficits  Psych: Normal mood and affect   Labs  High Sensitivity Troponin:  No results for input(s): "TROPONINIHS" in the last 720 hours.   Cardiac EnzymesNo results for input(s): "TROPONINI" in the last 168 hours. No results for input(s): "TROPIPOC" in the last 168 hours.  Chemistry Recent Labs  Lab 02/14/22 1309 02/15/22 0547 02/16/22 0253 02/17/22 0446 02/18/22 0641  NA 140 143 144 144 142  K 4.6 4.3 4.0 3.8 3.8  CL 103 107 105 110 107  CO2 26 30 26 27 25   GLUCOSE 127* 119* 97 74 110*  BUN 33* 35* 33* 29* 26*  CREATININE 2.93* 2.76* 2.53* 2.45* 2.26*  CALCIUM 8.8* 8.8* 9.0 8.6* 8.9  PROT 5.8* 6.1*  --   --   --   ALBUMIN 3.0* 2.9*  --   --   --   AST 16 15  --   --   --   ALT 22 19  --   --   --   ALKPHOS 117 112  --   --   --   BILITOT 0.9 0.7  --   --   --   GFRNONAA 20* 21* 24* 25* 27*  ANIONGAP 11 6 13 7 10     Hematology Recent Labs   Lab 02/14/22 1309 02/15/22 0547  WBC 10.4 10.9*  RBC 4.54 4.47  HGB 11.7* 11.4*  HCT 36.1* 35.9*  MCV 79.5* 80.3  MCH 25.8* 25.5*  MCHC 32.4 31.8  RDW 18.1* 18.1*  PLT 149* 155   BNP Recent Labs  Lab 02/14/22 1309  BNP 472.4*    DDimer No results for input(s): "DDIMER" in the last 168 hours.   Radiology  No results found.  Cardiac Studies  TTE 01/14/2022  1. Left ventricular ejection fraction, by estimation, is 55 to 60%. The  left ventricle has normal function. The left ventricle has no regional  wall motion abnormalities. Left ventricular diastolic parameters are  indeterminate. There is the  interventricular septum is flattened in systole and diastole, consistent  with right ventricular pressure and volume overload.   2. Right ventricular systolic function is severely reduced. The right  ventricular size is severely enlarged. There is severely elevated  pulmonary artery systolic pressure. The estimated right ventricular  systolic pressure is 24.2 mmHg.   3. Right atrial size was severely dilated.   4. The mitral valve is normal in structure. Trivial mitral valve  regurgitation. No evidence of mitral stenosis.   5. Tricuspid valve regurgitation is severe.   6. The aortic valve is tricuspid. There is mild calcification of the  aortic valve. Aortic valve regurgitation is not visualized. Aortic valve  sclerosis/calcification is present, without any evidence of aortic  stenosis. Aortic valve area, by VTI measures   2.61 cm. Aortic valve mean gradient measures 3.0 mmHg. Aortic valve Vmax  measures 1.16 m/s.   7. The inferior vena cava is normal in size with greater than 50%  respiratory variability, suggesting right atrial pressure of 3 mmHg.   Patient Profile  Jacob Silva is a 88 y.o. male with CKD stage III-4, CAD status post CABG, cor pulmonale, pulmonary fibrosis, chronic respiratory failure on home O2, COPD, diabetes, OSA who was admitted on 02/14/2022 for  acute on chronic diastolic heart failure.  Assessment & Plan   #  Acute on chronic HFpEF #Cor pulmonale secondary to lung disease -Approaching euvolemia.  We will continue 1 more day of Lasix 60 mg IV twice daily.  Anticipate euvolemic tomorrow.  Likely will transition to torsemide. -Very limited options for his heart and lung disease.  Treatment of his right heart is largely dependent on treatment of his lung disease.  I agree with DNR.  Would likely be a good candidate for hospice care.  Would have this discussion with him. -Continue home beta-blocker.  #CAD status post CABG -No angina.  Continue beta-blocker, Imdur, aspirin, Lipitor.  We will discontinue aspirin as an outpatient.  #CKD IV -Avoid nephrotoxic agents    For questions or updates, please contact [place] Please consult [email or web address] for contact info under   Signed, [place] T. Audie Box, MD, [place]  02/18/2022 10:42 AM

## 2022-02-18 NOTE — TOC Progression Note (Signed)
Transition of Care [facility]) - Progression Note    Patient Details  Name: Jacob Silva MRN: [id number] Date of Birth: 09-19-1931  [facility]) CM/SW Contact  Zenon Mayo, RN Phone Number: 02/18/2022, 4:36 PM  Clinical Narrative:    from home with wife,  CHF , IV lasix, 2 liters at home,.TOC following.        Expected Discharge Plan and Services                                                 Social Determinants of Health (SDOH) Interventions    Readmission Risk Interventions     No data to display

## 2022-02-18 NOTE — Progress Notes (Signed)
[facility]  PROGRESS NOTE  Jacob Silva QHU:[id number] DOB: 06-26-1932 DOA: 02/14/2022 PCP: [name], MD   Brief HPI:    88 year old African-American male with history of biventricular heart failure, CKD stage IIIb, baseline creatinine to 2.3, chronic respiratory failure with hypoxemia on oxygen 2 L/min, diabetes mellitus type 2, CAD, hypertension came to ED with worsening lower extremity edema, shortness of breath and weight gain.  Patient wife says that after he was discharged from the hospital he weighed 210 pounds over the past 2 weeks he is increasingly gaining weight and he weighed 232 pounds at home.  His cardiologist had increased Lasix to 60 mg daily. Over past few days wife noted that he was having increasing lower extremity edema.  Patient's echocardiogram during last hospitalization showed EF of 55 to 60%.  Right ventricle function was severely reduced.  Chest x-ray in the ED showed pulmonary edema.  Patient started on Lasix 60 mg IV twice daily.    Subjective   Patient seen and examined, breathing improved.  Diuresed well with IV Lasix.   Assessment/Plan:   Acute on chronic biventricular heart failure -Presented with worsening edema, weight gain; secondary to both left and right heart failure -Patient started on Lasix 60 mg IV every 12 hours -Also started on Diamox; blood pressure is soft, Diamox was held. -Breathing has improved, leg swelling also improved. -Strict intake and output -Daily weight monitoring -Cardiology following  Acute kidney injury superimposed on CKD stage IIIb -Secondary to left ventricular dysfunction -Creatinine improving with diuresis; today creatinine is down to 2.26 -Continue IV Lasix  Acute on chronic hypoxemic respiratory failure -Continue supplemental oxygen  -Patient is on 2 L/minute oxygen at home   Hypothyroidism -Continue Synthroid 100 mcg daily  Hypertension Continue Coreg 6.25 twice daily -ARB on hold due  to acute kidney injury  Diabetes mellitus type 2 -CBG well controlled -Continue Lantus -Sliding scale insulin NovoLog   Medications     allopurinol  100 mg Oral Daily   aspirin EC  81 mg Oral Daily   atorvastatin  40 mg Oral QHS   carvedilol  6.25 mg Oral BID WC   furosemide  60 mg Intravenous Q12H   guaiFENesin  600 mg Oral BID   heparin  5,000 Units Subcutaneous Q8H   insulin aspart  0-5 Units Subcutaneous QHS   insulin aspart  0-9 Units Subcutaneous TID WC   insulin glargine-yfgn  10 Units Subcutaneous QHS   isosorbide mononitrate  30 mg Oral Daily   levothyroxine  100 mcg Oral q morning   omeprazole  20 mg Oral Daily     Data Reviewed:   CBG:  Recent Labs  Lab 02/17/22 1143 02/17/22 1633 02/17/22 2055 02/18/22 0633 02/18/22 1112  GLUCAP 115* 146* 166* 103* 145*    SpO2: 94 % O2 Flow Rate (L/min): 2 L/min    Vitals:   02/17/22 2313 02/18/22 0422 02/18/22 0500 02/18/22 0809  BP: 118/67 (!) 110/57  120/79  Pulse: 69 76  91  Resp: 18 18  18   Temp: 97.8 F (36.6 C) 98.2 F (36.8 C)  98.1 F (36.7 C)  TempSrc: Oral Oral  Oral  SpO2: 94% 94%  94%  Weight:   101 kg   Height:          Data Reviewed:  Basic Metabolic Panel: Recent Labs  Lab 02/14/22 1309 02/15/22 0547 02/16/22 0253 02/17/22 0446 02/18/22 0641  NA 140 143 144 144 142  K 4.6 4.3  4.0 3.8 3.8  CL 103 107 105 110 107  CO2 26 30 26 27 25   GLUCOSE 127* 119* 97 74 110*  BUN 33* 35* 33* 29* 26*  CREATININE 2.93* 2.76* 2.53* 2.45* 2.26*  CALCIUM 8.8* 8.8* 9.0 8.6* 8.9  MG 2.0 2.0  --   --   --     CBC: Recent Labs  Lab 02/14/22 1309 02/15/22 0547  WBC 10.4 10.9*  NEUTROABS 7.9* 7.8*  HGB 11.7* 11.4*  HCT 36.1* 35.9*  MCV 79.5* 80.3  PLT 149* 155    LFT Recent Labs  Lab 02/14/22 1309 02/15/22 0547  AST 16 15  ALT 22 19  ALKPHOS 117 112  BILITOT 0.9 0.7  PROT 5.8* 6.1*  ALBUMIN 3.0* 2.9*     Antibiotics: Anti-infectives (From admission, onward)    None         DVT prophylaxis: Heparin  Code Status: DNR  Family Communication: Discussed with wife at bedside   CONSULTS    Objective    Physical Examination:  General-appears in no acute distress Heart-S1-S2, regular, no murmur auscultated Lungs-faint crackles bilaterally Abdomen-soft, nontender, no organomegaly Extremities-trace edema in the lower extremities Neuro-alert, oriented x3, no focal deficit noted  Status is: Inpatient:          [place]   [facility] If 7PM-7AM, please contact night-coverage at [email or web address], Office  [phone]   02/18/2022, 11:54 AM  LOS: 3 days            No

## 2022-02-19 ENCOUNTER — Encounter (HOSPITAL_COMMUNITY): Payer: Self-pay | Admitting: Family Medicine

## 2022-02-19 DIAGNOSIS — I509 Heart failure, unspecified: Secondary | ICD-10-CM | POA: Diagnosis not present

## 2022-02-19 DIAGNOSIS — I2510 Atherosclerotic heart disease of native coronary artery without angina pectoris: Secondary | ICD-10-CM | POA: Diagnosis not present

## 2022-02-19 DIAGNOSIS — J9621 Acute and chronic respiratory failure with hypoxia: Secondary | ICD-10-CM | POA: Diagnosis not present

## 2022-02-19 DIAGNOSIS — I5082 Biventricular heart failure: Secondary | ICD-10-CM | POA: Diagnosis not present

## 2022-02-19 LAB — BASIC METABOLIC PANEL
Anion gap: 9 (ref 5–15)
BUN: 25 mg/dL — ABNORMAL HIGH (ref 8–23)
CO2: 27 mmol/L (ref 22–32)
Calcium: 9.2 mg/dL (ref 8.9–10.3)
Chloride: 105 mmol/L (ref 98–111)
Creatinine, Ser: 2.28 mg/dL — ABNORMAL HIGH (ref 0.61–1.24)
GFR, Estimated: 27 mL/min — ABNORMAL LOW (ref >60)
Glucose, Bld: 103 mg/dL — ABNORMAL HIGH (ref 70–99)
Potassium: 3.6 mmol/L (ref 3.5–5.1)
Sodium: 141 mmol/L (ref 135–145)

## 2022-02-19 LAB — GLUCOSE, CAPILLARY
Glucose-Capillary: 99 mg/dL (ref 70–99)
Glucose-Capillary: 122 mg/dL — ABNORMAL HIGH (ref 70–99)
Glucose-Capillary: 129 mg/dL — ABNORMAL HIGH (ref 70–99)

## 2022-02-19 MED ORDER — UMECLIDINIUM-VILANTEROL 62.5-25 MCG/ACT IN AEPB
1.0000 | INHALATION_SPRAY | Freq: Every day | RESPIRATORY_TRACT | Status: DC
  Filled 2022-02-19: qty 14

## 2022-02-19 MED ORDER — FAMOTIDINE 20 MG PO TABS: 20.0000 mg | ORAL_TABLET | Freq: Every day | ORAL | 2 refills | Status: DC | PRN

## 2022-02-19 MED ORDER — TORSEMIDE 20 MG PO TABS
40.0000 mg | ORAL_TABLET | Freq: Every day | ORAL | Status: DC
  Administered 2022-02-19: 40 mg via ORAL
  Filled 2022-02-19: qty 2

## 2022-02-19 MED ORDER — OXYCODONE HCL 5 MG PO TABS
5.0000 mg | ORAL_TABLET | Freq: Four times a day (QID) | ORAL | Status: DC | PRN
  Administered 2022-02-19: 5 mg via ORAL
  Filled 2022-02-19: qty 1

## 2022-02-19 MED ORDER — GUAIFENESIN ER 600 MG PO TB12: 600.0000 mg | ORAL_TABLET | Freq: Two times a day (BID) | ORAL | 0 refills | Status: AC

## 2022-02-19 MED ORDER — TORSEMIDE 40 MG PO TABS: 40.0000 mg | ORAL_TABLET | Freq: Every day | ORAL | 2 refills | Status: DC

## 2022-02-19 MED ORDER — FAMOTIDINE 20 MG PO TABS: 20.0000 mg | ORAL_TABLET | Freq: Every day | ORAL | Status: DC

## 2022-02-19 NOTE — Progress Notes (Signed)
Occupational Therapy Treatment Patient Details Name: Jacob Silva MRN: [id number] DOB: [birth year] Today's Date: 02/19/2022   History of present illness Pt is an 88 y.o. male who presented 02/14/22 witn increasing SOB, LE edema and weight gain. Chest xray showed pulmonary edema.  PMH: CAD s/p CABG, CKD stage 3b, DM, HTN, HLD, CHF, COPD/pulmonary fibrosis on 2L O2, CHF, sleep apnea on CPAP, anemia, NSTEMI 6/23.   OT comments  Patient received in recliner and agreeable to OT session. Patient provided handout for energy conservation strategies and discussed with patient and wife. Patient ambulated to bathroom to address toilet transfer with min guard to HHA. Patient provided yellow therapy band and instruction on UE exercises. Patient asked to recall energy conservation strategies and was able to recall 1/3 with verbal cues. Patient is expected to return home with wife.   Recommendations for follow up therapy are one component of a multi-disciplinary discharge planning process, led by the attending physician.  Recommendations may be updated based on patient status, additional functional criteria and insurance authorization.    Follow Up Recommendations  Home health OT    Assistance Recommended at Discharge Set up Supervision/Assistance  Patient can return home with the following  A little help with walking and/or transfers;A little help with bathing/dressing/bathroom;Assist for transportation;Help with stairs or ramp for entrance   Equipment Recommendations  None recommended by OT    Recommendations for Other Services      Precautions / Restrictions Precautions Precautions: Fall Precaution Comments: shuffles feet Restrictions Weight Bearing Restrictions: No       Mobility Bed Mobility Overal bed mobility: Needs Assistance             General bed mobility comments: seated in recliner    Transfers Overall transfer level: Needs assistance Equipment used: None Transfers: Sit  to/from Stand Sit to Stand: Min guard           General transfer comment: ambulated to bathroom for toilet transfer     Balance Overall balance assessment: Needs assistance Sitting-balance support: No upper extremity supported, Feet supported Sitting balance-Leahy Scale: Good Sitting balance - Comments: seated in recliner   Standing balance support: No upper extremity supported Standing balance-Leahy Scale: Poor Standing balance comment: ambualted to bathroom without an assistive device                           ADL either performed or assessed with clinical judgement   ADL Overall ADL's : Needs assistance/impaired                     Lower Body Dressing: Maximal assistance Lower Body Dressing Details (indicate cue type and reason): to donn slippers Toilet Transfer: Min guard;Ambulation;Regular Glass blower/designer Details (indicate cue type and reason): ambulated to bathroom without an assistive device           General ADL Comments: transfer training with toilet transfer    Extremity/Trunk Assessment              Vision       Perception     Praxis      Cognition Arousal/Alertness: Awake/alert Behavior During Therapy: WFL for tasks assessed/performed Overall Cognitive Status: Within Functional Limits for tasks assessed                                 General Comments: speak slowly so he can  hear with his hearing loss        Exercises Exercises: General Upper Extremity General Exercises - Upper Extremity Shoulder Flexion: Strengthening, Both, 10 reps, Seated, Theraband Theraband Level (Shoulder Flexion): Level 1 (Yellow) Shoulder ABduction: Strengthening, Both, 10 reps, Seated, Theraband Theraband Level (Shoulder Abduction): Level 1 (Yellow) Elbow Flexion: Strengthening, Both, 10 reps, Seated, Theraband Theraband Level (Elbow Flexion): Level 1 (Yellow) Elbow Extension: Strengthening, Both, 10 reps, Seated,  Theraband Theraband Level (Elbow Extension): Level 1 (Yellow)    Shoulder Instructions       General Comments energy conservation strategy handout provided and reviewed with patient    Pertinent Vitals/ Pain       Pain Assessment Pain Assessment: No/denies pain  Home Living                                          Prior Functioning/Environment              Frequency  Min 2X/week        Progress Toward Goals  OT Goals(current goals can now be found in the care plan section)  Progress towards OT goals: Progressing toward goals  Acute Rehab OT Goals Patient Stated Goal: go home OT Goal Formulation: With patient/family Time For Goal Achievement: 03/04/22 Potential to Achieve Goals: Good ADL Goals Additional ADL Goal #1: Pt will perform >12 mins of OOB ADL in standing with O2 >90% in order to increase activity tolerance. Additional ADL Goal #2: Pt will state 3 energy conservation techniques for OOB ADL tasks.  Plan Discharge plan remains appropriate    Co-evaluation                 AM-PAC OT "6 Clicks" Daily Activity     Outcome Measure   Help from another person eating meals?: None Help from another person taking care of personal grooming?: A Little Help from another person toileting, which includes using toliet, bedpan, or urinal?: A Little Help from another person bathing (including washing, rinsing, drying)?: A Little Help from another person to put on and taking off regular upper body clothing?: None Help from another person to put on and taking off regular lower body clothing?: A Little 6 Click Score: 20    End of Session Equipment Utilized During Treatment: Oxygen  OT Visit Diagnosis: Unsteadiness on feet (R26.81);Muscle weakness (generalized) (M62.81)   Activity Tolerance Patient tolerated treatment well   Patient Left in chair;with chair alarm set;with family/visitor present   Nurse Communication Mobility status         Time: 1240-1312 OT Time Calculation (min): 32 min  Charges: OT General Charges $OT Visit: 1 Visit OT Treatments $Self Care/Home Management : 8-22 mins $Therapeutic Activity: 8-22 mins  Lodema Hong, OTA Acute Rehabilitation Services  Office [phone]   Trixie Dredge 02/19/2022, 1:50 PM

## 2022-02-19 NOTE — Progress Notes (Signed)
Dr Audie Box originally requested to arrange [facility] f/u 2-4 weeks but patient follows with Dr. Humphrey Rolls in [place]. I confirmed with the pt he plans to continue to follow-up with Dr. Humphrey Rolls. Recommend he contact Dr. Laurelyn Sickle office upon discharge for close follow-up. Will also include this FYI on AVS.

## 2022-02-19 NOTE — Plan of Care (Signed)
Pt c/o bilateral foot pain, per wife possibly gout related. PRN tylenol given per order. Pt reported some relief.    Problem: Education: Goal: Ability to demonstrate management of disease process will improve Outcome: Progressing Goal: Ability to verbalize understanding of medication therapies will improve Outcome: Progressing Goal: Individualized Educational Video(s) Outcome: Progressing   Problem: Activity: Goal: Capacity to carry out activities will improve Outcome: Progressing   Problem: Cardiac: Goal: Ability to achieve and maintain adequate cardiopulmonary perfusion will improve Outcome: Progressing   Problem: Education: Goal: Ability to describe self-care measures that may prevent or decrease complications (Diabetes Survival Skills Education) will improve Outcome: Progressing Goal: Individualized Educational Video(s) Outcome: Progressing   Problem: Coping: Goal: Ability to adjust to condition or change in health will improve Outcome: Progressing   Problem: Fluid Volume: Goal: Ability to maintain a balanced intake and output will improve Outcome: Progressing   Problem: Health Behavior/Discharge Planning: Goal: Ability to identify and utilize available resources and services will improve Outcome: Progressing Goal: Ability to manage health-related needs will improve Outcome: Progressing   Problem: Metabolic: Goal: Ability to maintain appropriate glucose levels will improve Outcome: Progressing   Problem: Nutritional: Goal: Maintenance of adequate nutrition will improve Outcome: Progressing Goal: Progress toward achieving an optimal weight will improve Outcome: Progressing   Problem: Skin Integrity: Goal: Risk for impaired skin integrity will decrease Outcome: Progressing   Problem: Tissue Perfusion: Goal: Adequacy of tissue perfusion will improve Outcome: Progressing   Problem: Education: Goal: Knowledge of General Education information will  improve Description: Including pain rating scale, medication(s)/side effects and non-pharmacologic comfort measures Outcome: Progressing   Problem: Health Behavior/Discharge Planning: Goal: Ability to manage health-related needs will improve Outcome: Progressing   Problem: Clinical Measurements: Goal: Ability to maintain clinical measurements within normal limits will improve Outcome: Progressing Goal: Will remain free from infection Outcome: Progressing Goal: Diagnostic test results will improve Outcome: Progressing Goal: Respiratory complications will improve Outcome: Progressing Goal: Cardiovascular complication will be avoided Outcome: Progressing   Problem: Activity: Goal: Risk for activity intolerance will decrease Outcome: Progressing   Problem: Nutrition: Goal: Adequate nutrition will be maintained Outcome: Progressing   Problem: Coping: Goal: Level of anxiety will decrease Outcome: Progressing   Problem: Elimination: Goal: Will not experience complications related to bowel motility Outcome: Progressing Goal: Will not experience complications related to urinary retention Outcome: Progressing   Problem: Pain Managment: Goal: General experience of comfort will improve Outcome: Progressing   Problem: Safety: Goal: Ability to remain free from injury will improve Outcome: Progressing   Problem: Skin Integrity: Goal: Risk for impaired skin integrity will decrease Outcome: Progressing

## 2022-02-19 NOTE — Progress Notes (Addendum)
Nurse notified in secure chat for SBP 87, rechecked and confirmed, asymptomatic. Dr. Audie Box reviewed and feels carvedilol can be discontinued. He was still Ok with Jacob Silva going home today. Dr Darrick Meigs aware of change and he will update in DC med rec. As mentioned in prior note, Jacob Silva to f/u with primary cardiologist Dr. Humphrey Rolls.

## 2022-02-19 NOTE — Discharge Summary (Addendum)
Physician Discharge Summary   Patient: Jacob Silva MRN: [id number] DOB: August 30, 1931  Admit date:     02/14/2022  Discharge date: 02/19/22  Discharge Physician: Oswald Hillock   PCP: [name], MD   Recommendations at discharge:   Follow-up cardiology as outpatient  Discharge Diagnoses: Principal Problem:   Biventricular heart failure ([place]) Active Problems:   Acute renal failure superimposed on stage 3b chronic kidney disease (HCC)   Congestive heart failure with right ventricular systolic dysfunction (HCC)   CAD (coronary artery disease)   Chronic kidney disease, stage 3b (HCC)   Diabetes mellitus type II, controlled ([place])   HTN (hypertension)   Hypothyroidism   Acute on chronic respiratory failure with hypoxia (HCC)   CHF exacerbation ([place])   DNR (do not resuscitate)  Resolved Problems:   * No resolved hospital problems. *  Hospital Course: 88 year old African-American male with history of biventricular heart failure, CKD stage IIIb, baseline creatinine to 2.3, chronic respiratory failure with hypoxemia on oxygen 2 L/min, diabetes mellitus type 2, CAD, hypertension came to ED with worsening lower extremity edema, shortness of breath and weight gain.  Patient wife says that after he was discharged from the hospital he weighed 210 pounds over the past 2 weeks he is increasingly gaining weight and he weighed 232 pounds at home.  His cardiologist had increased Lasix to 60 mg daily. Over past few days wife noted that he was having increasing lower extremity edema.  Patient's echocardiogram during last hospitalization showed EF of 55 to 60%.  Right ventricle function was severely reduced.  Chest x-ray in the ED showed pulmonary edema.  Patient started on Lasix 60 mg IV twice daily.    Assessment and Plan:  Acute on chronic biventricular heart failure -Presented with worsening edema, weight gain; secondary to both left and right heart failure -Patient started on Lasix 60 mg  IV every 12 hours -Also started on Diamox; blood pressure is soft, Diamox was held. -Breathing has improved, leg swelling also improved. -Strict intake and output -Daily weight monitoring -IV Lasix has been discontinued, patient started on torsemide 40 mg p.o. daily   Acute kidney injury superimposed on CKD stage IIIb -Secondary to left ventricular dysfunction -Creatinine improving with diuresis; today creatinine is down to 2.28 -IV Lasix discontinued, patient started on torsemide 40 mg p.o. daily   Acute on chronic hypoxemic respiratory failure -Continue supplemental oxygen  -Patient is on 2 L/minute oxygen at home    Hypothyroidism -Continue Synthroid 100 mcg daily   Hypertension Continue Coreg 6.25 twice daily -ARB on hold due to acute kidney injury   Diabetes mellitus type 2 -CBG well controlled -Continue Lantus             Consultants: Cardiology Procedures performed:  Disposition: Home Diet recommendation:  Discharge Diet Orders (From admission, onward)     Start     Ordered   02/19/22 0000  Diet - low sodium heart healthy        02/19/22 1209           Carb modified diet DISCHARGE MEDICATION: Allergies as of 02/19/2022       Reactions   Penicillin G Other (See Comments)   Penicillins Swelling   Tramadol    Other reaction(s): Dizziness        Medication List     STOP taking these medications    benazepril 10 MG tablet Commonly known as: LOTENSIN   carvedilol 6.25 MG tablet Commonly known as: COREG  furosemide 20 MG tablet Commonly known as: LASIX   furosemide 40 MG tablet Commonly known as: LASIX   omeprazole 20 MG tablet Commonly known as: PRILOSEC OTC       TAKE these medications    acetaminophen 325 MG tablet Commonly known as: TYLENOL Take 650 mg by mouth every 6 (six) hours as needed for mild pain.   albuterol 108 (90 Base) MCG/ACT inhaler Commonly known as: VENTOLIN HFA   allopurinol 100 MG tablet Commonly  known as: ZYLOPRIM Take 100 mg by mouth daily.   Anoro Ellipta 62.5-25 MCG/ACT Aepb Generic drug: umeclidinium-vilanterol Inhale 1 puff into the lungs daily.   aspirin EC 81 MG tablet Take 81 mg by mouth daily.   atorvastatin 80 MG tablet Commonly known as: LIPITOR Take 40 mg by mouth at bedtime.   clopidogrel 75 MG tablet Commonly known as: PLAVIX Take 75 mg by mouth daily.   diclofenac Sodium 1 % Gel Commonly known as: VOLTAREN Apply 2 g topically 4 (four) times daily as needed (pain).   famotidine 20 MG tablet Commonly known as: PEPCID Take 1 tablet (20 mg total) by mouth daily as needed for heartburn or indigestion.   Farxiga 10 MG Tabs tablet Generic drug: dapagliflozin propanediol Take 10 mg by mouth daily.   gabapentin 400 MG capsule Commonly known as: NEURONTIN Take 800 mg by mouth 2 (two) times daily.   guaiFENesin 600 MG 12 hr tablet Commonly known as: MUCINEX Take 1 tablet (600 mg total) by mouth 2 (two) times daily for 5 days.   isosorbide mononitrate 30 MG 24 hr tablet Commonly known as: IMDUR Take 30 mg by mouth daily.   ketoconazole 2 % cream Commonly known as: NIZORAL Apply 1 application. topically daily.   Lantus SoloStar 100 UNIT/ML Solostar Pen Generic drug: insulin glargine Inject 20 Units into the skin at bedtime.   levothyroxine 100 MCG tablet Commonly known as: SYNTHROID Take 100 mcg by mouth every morning.   loperamide 2 MG capsule Commonly known as: IMODIUM Take 2 mg by mouth daily as needed for diarrhea or loose stools.   loratadine 10 MG tablet Commonly known as: CLARITIN Take 10 mg by mouth daily as needed for allergies.   Perforomist 20 MCG/2ML nebulizer solution Generic drug: formoterol Take 20 mcg by nebulization 2 (two) times daily.   Systane Balance 0.6 % Soln Generic drug: Propylene Glycol Place 1 drop into both eyes daily as needed (dry eyes).   Torsemide 40 MG Tabs Take 40 mg by mouth daily. Start taking on:  February 20, 2022        Follow-up Information     [name], MD. Go on 03/11/2022.   Specialty: Internal Medicine Why: @2 :45pm Contact information: [place] [address] [phone]         Dionisio David, MD Follow up.   Specialty: Cardiology Why: Please call Dr. Laurelyn Sickle office for a follow-up appointment to be seen in 2-4 weeks per Dr. Kathalene Frames recommendation. Contact information: [place] [address] [phone]         [place]. Go in 14 day(s).   Specialty: Cardiology Why: Hospital follow up PLEASE bring a current medication list to appointment FREE valet parking, Entrance [address], off of [address]. Contact information: [address] [id number] [place] [phone]        [facility] [place] Follow up.   Why: HHPT,HHOT- Agency will contact  you to set up apt times Contact information: [place] [address] [phone]                Discharge Exam: Danley Danker Weights   02/17/22 0423 02/18/22 0500 02/19/22 0426  Weight: 100.1 kg 101 kg 101.1 kg   General-appears in no acute distress Heart-S1-S2, regular, no murmur auscultated Lungs-clear to auscultation bilaterally, no wheezing or crackles auscultated Abdomen-soft, nontender, no organomegaly Extremities-trace edema in the lower extremities Neuro-alert, oriented x3, no focal deficit noted  Condition at discharge: good  The results of significant diagnostics from this hospitalization (including imaging, microbiology, ancillary and laboratory) are listed below for reference.   Imaging Studies: US Renal  Result Date: 02/14/2022 CLINICAL DATA:  Renal failure EXAM: RENAL / URINARY TRACT ULTRASOUND COMPLETE COMPARISON:  None Available. FINDINGS: Right Kidney: Renal measurements: 8.5 x 4.6 x 5.3 cm = volume: 106.6 mL. Normal renal echotexture. No  hydronephrosis. There are 2 simple appearing right renal cysts, measuring up to 4.9 and 3.0 cm each. Left Kidney: Renal measurements: 10.6 x 5.8 by 5.7 cm = volume: 181.4 mL. Normal renal echotexture. No hydronephrosis. There are 2 simple appearing left renal cysts, measuring up to 2.3 and 2.0 cm each. Bladder: Appears normal for degree of bladder distention. Other: None. IMPRESSION: 1. Simple appearing bilateral renal cysts. 2. Otherwise unremarkable renal ultrasound. Electronically Signed   By: Randa Ngo M.D.   On: 02/14/2022 21:11   DG Chest 2 View  Result Date: 02/14/2022 CLINICAL DATA:  Dyspnea. EXAM: CHEST - 2 VIEW COMPARISON:  January 13, 2022. FINDINGS: Stable cardiomegaly. Status post coronary bypass graft. Bilateral perihilar and basilar interstitial densities are noted most consistent with pulmonary edema. Bony thorax is unremarkable. IMPRESSION: Bilateral interstitial densities are noted most consistent with pulmonary edema. Electronically Signed   By: Marijo Conception M.D.   On: 02/14/2022 13:20    Microbiology: Results for orders placed or performed during the hospital encounter of 01/13/22  SARS Coronavirus 2 by RT PCR (hospital order, performed in [facility] hospital lab) *cepheid single result test* Anterior Nasal Swab     Status: None   Collection Time: 01/13/22  3:41 PM   Specimen: Anterior Nasal Swab  Result Value Ref Range Status   SARS Coronavirus 2 by RT PCR NEGATIVE NEGATIVE Final    Comment: (NOTE) SARS-CoV-2 target nucleic acids are NOT DETECTED.  The SARS-CoV-2 RNA is generally detectable in upper and lower respiratory specimens during the acute phase of infection. The lowest concentration of SARS-CoV-2 viral copies this assay can detect is 250 copies / mL. A negative result does not preclude SARS-CoV-2 infection and should not be used as the sole basis for treatment or other patient management decisions.  A negative result may occur with improper specimen  collection / handling, submission of specimen other than nasopharyngeal swab, presence of viral mutation(s) within the areas targeted by this assay, and inadequate number of viral copies (<250 copies / mL). A negative result must be combined with clinical observations, patient history, and epidemiological information.  Fact Sheet for Patients:   https://www.patel.info/  Fact Sheet for Healthcare Providers: https://hall.com/  This test is not yet approved or  cleared by the [place] FDA and has been authorized for detection and/or diagnosis of SARS-CoV-2 by FDA under an Emergency Use Authorization (EUA).  This EUA will remain in effect (meaning this test can be used) for the duration of the COVID-19 declaration under Section 564(b)(1) of the Act, 21 U.S.C.  section 360bbb-3(b)(1), unless the authorization is terminated or revoked sooner.  Performed at [facility], [place] [address], [place], [place] [address]     Labs: CBC: Recent Labs  Lab 02/14/22 1309 02/15/22 0547  WBC 10.4 10.9*  NEUTROABS 7.9* 7.8*  HGB 11.7* 11.4*  HCT 36.1* 35.9*  MCV 79.5* 80.3  PLT 149* 528   Basic Metabolic Panel: Recent Labs  Lab 02/14/22 1309 02/15/22 0547 02/16/22 0253 02/17/22 0446 02/18/22 0641 02/19/22 0128  NA 140 143 144 144 142 141  K 4.6 4.3 4.0 3.8 3.8 3.6  CL 103 107 105 110 107 105  CO2 26 30 26 27 25 27   GLUCOSE 127* 119* 97 74 110* 103*  BUN 33* 35* 33* 29* 26* 25*  CREATININE 2.93* 2.76* 2.53* 2.45* 2.26* 2.28*  CALCIUM 8.8* 8.8* 9.0 8.6* 8.9 9.2  MG 2.0 2.0  --   --   --   --    Liver Function Tests: Recent Labs  Lab 02/14/22 1309 02/15/22 0547  AST 16 15  ALT 22 19  ALKPHOS 117 112  BILITOT 0.9 0.7  PROT 5.8* 6.1*  ALBUMIN 3.0* 2.9*   CBG: Recent Labs  Lab 02/18/22 1112 02/18/22 1638 02/18/22 2108 02/19/22 0627 02/19/22 1120  GLUCAP 145* 115* 137* 99 122*    Discharge time spent: greater than  30 minutes.  Signed: Oswald Hillock, MD [facility] 02/19/2022

## 2022-02-19 NOTE — Progress Notes (Signed)
Physical Therapy Treatment Patient Details Name: Jacob Silva MRN: [id number] DOB: 1931/08/05 Today's Date: 02/19/2022   History of Present Illness Pt is an 88 y.o. male who presented 02/14/22 witn increasing SOB, LE edema and weight gain. Chest xray showed pulmonary edema.  PMH: CAD s/p CABG, CKD stage 3b, DM, HTN, HLD, CHF, COPD/pulmonary fibrosis on 2L O2, CHF, sleep apnea on CPAP, anemia, NSTEMI 6/23.    PT Comments    Pt progressing with mobility, more stable with ambulation, however, pt continues to desat. SPO2 86% at 125' on 3L O2. Required 5 mins rest at 3L for SPO2 to return to 90's. Pt likely to d.c today, changed d/c rec to HHPT for continued progression and a balance program. PT will continue to follow.    Recommendations for follow up therapy are one component of a multi-disciplinary discharge planning process, led by the attending physician.  Recommendations may be updated based on patient status, additional functional criteria and insurance authorization.  Follow Up Recommendations  Home health PT     Assistance Recommended at Discharge Intermittent Supervision/Assistance  Patient can return home with the following A little help with walking and/or transfers;A little help with bathing/dressing/bathroom;Assistance with cooking/housework;Assist for transportation;Help with stairs or ramp for entrance   Equipment Recommendations  None recommended by PT    Recommendations for Other Services       Precautions / Restrictions Precautions Precautions: Fall Precaution Comments: shuffles feet Restrictions Weight Bearing Restrictions: No     Mobility  Bed Mobility Overal bed mobility: Needs Assistance Bed Mobility: Supine to Sit     Supine to sit: HOB elevated, Supervision     General bed mobility comments: pt able to bring LE's off bed and come to EOB slowly without physical assist    Transfers Overall transfer level: Needs assistance Equipment used: Rolling  walker (2 wheels) Transfers: Sit to/from Stand Sit to Stand: Min guard           General transfer comment: min-guard A for standing    Ambulation/Gait Ambulation/Gait assistance: Min guard Gait Distance (Feet): 250 Feet Assistive device: Rolling walker (2 wheels) Gait Pattern/deviations: Step-through pattern, Shuffle Gait velocity: decreased Gait velocity interpretation: <1.8 ft/sec, indicate of risk for recurrent falls   General Gait Details: pt more steady today than yesterday. Took standing rest break at 125' with SPO2 86% on 3L. Pt fatigued by end of 250', took 5 mins for SPO2 to return to 93% on 3L. SpO2 left at 3L for further recovery and nsg notified. Pt more stable with changing directions with RW today. Has rollator at home   Stairs             Wheelchair Mobility    Modified Rankin (Stroke Patients Only)       Balance Overall balance assessment: Needs assistance Sitting-balance support: Single extremity supported, Feet supported Sitting balance-Leahy Scale: Good     Standing balance support: Bilateral upper extremity supported, During functional activity Standing balance-Leahy Scale: Poor Standing balance comment: reliant on UE support                            Cognition Arousal/Alertness: Awake/alert Behavior During Therapy: WFL for tasks assessed/performed Overall Cognitive Status: Within Functional Limits for tasks assessed                                 General Comments: speak slowly  so he can hear with his hearing loss        Exercises      General Comments General comments (skin integrity, edema, etc.): pt improving but would benefit from HHPT for continued progression as well as balance program      Pertinent Vitals/Pain Pain Assessment Pain Assessment: No/denies pain    Home Living                          Prior Function            PT Goals (current goals can now be found in the care  plan section) Acute Rehab PT Goals Patient Stated Goal: return home PT Goal Formulation: With patient Time For Goal Achievement: 03/04/22 Potential to Achieve Goals: Good Progress towards PT goals: Progressing toward goals    Frequency    Min 3X/week      PT Plan Discharge plan needs to be updated    Co-evaluation              AM-PAC PT "6 Clicks" Mobility   Outcome Measure  Help needed turning from your back to your side while in a flat bed without using bedrails?: A Little Help needed moving from lying on your back to sitting on the side of a flat bed without using bedrails?: A Little Help needed moving to and from a bed to a chair (including a wheelchair)?: A Little Help needed standing up from a chair using your arms (e.g., wheelchair or bedside chair)?: A Little Help needed to walk in hospital room?: A Little Help needed climbing 3-5 steps with a railing? : A Lot 6 Click Score: 17    End of Session Equipment Utilized During Treatment: Gait belt;Oxygen Activity Tolerance: Patient tolerated treatment well;Patient limited by fatigue Patient left: with call bell/phone within reach;in chair;with chair alarm set Nurse Communication: Mobility status PT Visit Diagnosis: History of falling (Z91.81);Difficulty in walking, not elsewhere classified (R26.2);Muscle weakness (generalized) (M62.81)     Time: 4496-7591 PT Time Calculation (min) (ACUTE ONLY): 26 min  Charges:  $Gait Training: 23-37 mins                     Leighton Roach, PT  Acute Rehab Services Secure chat preferred Office [place] 02/19/2022, 10:28 AM

## 2022-02-19 NOTE — Progress Notes (Signed)
Heart Failure Nurse Navigator Progress Note  PCP: [name], MD PCP-Cardiologist: [place] Admission Diagnosis: None Admitted from: Home via POV  Presentation:   Jacob Silva presented with shortness of breath, chest pain, bilateral lower leg edema. Recently discharged , patient saw primary last week and lasix was increased to 40 mg BID. Wears 2 L [place] at home, and continued to be short of breath. Patient has also gained 20 lbs since his discharge last month. BP 115/55, HF 64, 2+ BLE. IV lasix 60mg , given.   Patient and wife were both educated on the sign and symptoms of heart failure. Daily weights, which patient states he does every day, we spoke on when to call his doctor or go to the ER, Diet/ fluid restrictions, states his wife does the cooking and does real good with low salt, and he normally doesn't drink a lot of fluids. Taking his medications as prescribed and attending all his appointments. Patient is scheduled for a hospital follow up with HF TOC on 03/05/2022 @ 2 pm.   ECHO/ LVEF: 55-60% HFrEF  RV Dysfxn  Clinical Course:  Past Medical History:  Diagnosis Date   CAD (coronary artery disease)    a. 2013 s/p CABG x 3 (LIMA->dLAD, VG->RPDA, VG->OM1); b. 12/2021 NSTEMI (peak HsTrop 641)-->Med rx in setting of CKD IV. EF 55-60% w/ rwma by echo.   Chronic heart failure with preserved ejection fraction (HFpEF) ([place])    a. 12/2021 Echo: EF 55-60%, no rwma, sev RV dysfxn w/ RVSP 69.55mmHg. Sev dil RA. Triv MR. Ao sclerosis.   CKD (chronic kidney disease), stage IV (HCC)    COPD (chronic obstructive pulmonary disease) ([place])    a. Home O2.   Diabetes ([place])    Essential hypertension    Hyperlipidemia LDL goal <70    NSTEMI (non-ST elevated myocardial infarction) ([place]) 01/13/2022   OSA (obstructive sleep apnea)    Pulmonary fibrosis (HCC)      Social History   Socioeconomic History   Marital status: Married    Spouse name: International aid/development worker   Number of children: 3   Years of education:  Not on file   Highest education level: Doctorate  Occupational History   Occupation: Retired    Comment: Nature conservation officer  Tobacco Use   Smoking status: Former    Types: Cigarettes    Quit date: 1975    Years since quitting: 48.5   Smokeless tobacco: Never  Vaping Use   Vaping Use: Never used  Substance and Sexual Activity   Alcohol use: No   Drug use: No   Sexual activity: Not on file  Other Topics Concern   Not on file  Social History Narrative   Lives in [place] with his wife.  Does not routinely exercise.   Social Determinants of Health   Financial Resource Strain: Low Risk  (02/19/2022)   Overall Financial Resource Strain (CARDIA)    Difficulty of Paying Living Expenses: Not very hard  Food Insecurity: No Food Insecurity (02/19/2022)   Hunger Vital Sign    Worried About Running Out of Food in the Last Year: Never true    Ran Out of Food in the Last Year: Never true  Transportation Needs: No Transportation Needs (02/19/2022)   PRAPARE - Hydrologist (Medical): No    Lack of Transportation (Non-Medical): No  Physical Activity: Not on file  Stress: Not on file  Social Connections: Not on file   Education Assessment and Provision:  Detailed education  and instructions provided on heart failure disease management including the following:  Signs and symptoms of Heart Failure When to call the physician Importance of daily weights Low sodium diet Fluid restriction Medication management Anticipated future follow-up appointments  Patient education given on each of the above topics.  Patient acknowledges understanding via teach back method and acceptance of all instructions.  Education Materials:  "Living Better With Heart Failure" Booklet, HF zone tool, & Daily Weight Tracker Tool.  Patient has scale at home: yes Patient has pill box at home: NA    High Risk Criteria for Readmission and/or Poor Patient Outcomes: Heart failure hospital  admissions (last 6 months): 2  No Show rate: 7 Difficult social situation: No Demonstrates medication adherence: Yes Primary Language: English Literacy level: Reading, writing, and comprehension.   Barriers of Care:   Diet/ fluid restrictions Daily weights  Considerations/Referrals:   Referral made to Heart Failure Pharmacist Stewardship: No Referral made to Heart Failure CSW/NCM TOC: No Referral made to Heart & Vascular TOC clinic: Yes, 03/05/2022 @ 2pm  Items for Follow-up on DC/TOC: Diet/ fluid restrictions Daily weights   Earnestine Leys, BSN, RN Heart Failure Transport planner Only

## 2022-02-19 NOTE — Progress Notes (Signed)
Cardiology Progress Note  Patient ID: Jacob Silva MRN: [id number] DOB: 1931/09/14 Date of Encounter: 02/19/2022  Primary Cardiologist: None  Subjective   Chief Complaint: Weakness  HPI: Jacob Silva on exam.  Transition to torsemide.  Reports he feels weak.  ROS:  All other ROS reviewed and negative. Pertinent positives noted in the HPI.     Inpatient Medications  Scheduled Meds:  allopurinol  100 mg Oral Daily   aspirin EC  81 mg Oral Daily   atorvastatin  40 mg Oral QHS   carvedilol  6.25 mg Oral BID WC   guaiFENesin  600 mg Oral BID   heparin  5,000 Units Subcutaneous Q8H   insulin aspart  0-5 Units Subcutaneous QHS   insulin aspart  0-9 Units Subcutaneous TID WC   insulin glargine-yfgn  10 Units Subcutaneous QHS   isosorbide mononitrate  30 mg Oral Daily   levothyroxine  100 mcg Oral q morning   omeprazole  20 mg Oral Daily   torsemide  40 mg Oral Daily   umeclidinium-vilanterol  1 puff Inhalation Daily   Continuous Infusions:  PRN Meds: acetaminophen **OR** acetaminophen, albuterol, ondansetron **OR** ondansetron (ZOFRAN) IV, oxyCODONE   Vital Signs   Vitals:   02/18/22 2014 02/18/22 2157 02/19/22 0426 02/19/22 0829  BP: 116/62  122/63   Pulse: 60  69   Resp: 19  20   Temp: 98.4 F (36.9 C)  (!) 97.3 F (36.3 C)   TempSrc: Oral  Oral   SpO2: 96% 97% 97% 94%  Weight:   101.1 kg   Height:        Intake/Output Summary (Last 24 hours) at 02/19/2022 0910 Last data filed at 02/18/2022 2100 Gross per 24 hour  Intake 240 ml  Output 1700 ml  Net -1460 ml      02/19/2022    4:26 AM 02/18/2022    5:00 AM 02/17/2022    4:23 AM  Last 3 Weights  Weight (lbs) 222 lb 14.2 oz 222 lb 10.6 oz 220 lb 10.9 oz  Weight (kg) 101.1 kg 101 kg 100.1 kg      Telemetry  Overnight telemetry shows sinus rhythm in the 70s, which I personally reviewed.   Physical Exam   Vitals:   02/18/22 2014 02/18/22 2157 02/19/22 0426 02/19/22 0829  BP: 116/62  122/63   Pulse: 60  69    Resp: 19  20   Temp: 98.4 F (36.9 C)  (!) 97.3 F (36.3 C)   TempSrc: Oral  Oral   SpO2: 96% 97% 97% 94%  Weight:   101.1 kg   Height:        Intake/Output Summary (Last 24 hours) at 02/19/2022 0910 Last data filed at 02/18/2022 2100 Gross per 24 hour  Intake 240 ml  Output 1700 ml  Net -1460 ml       02/19/2022    4:26 AM 02/18/2022    5:00 AM 02/17/2022    4:23 AM  Last 3 Weights  Weight (lbs) 222 lb 14.2 oz 222 lb 10.6 oz 220 lb 10.9 oz  Weight (kg) 101.1 kg 101 kg 100.1 kg    Body mass index is 31.09 kg/m.  General: Well nourished, well developed, in no acute distress Head: Atraumatic, normal size  Eyes: PEERLA, EOMI  Neck: Supple, no JVD Endocrine: No thryomegaly Cardiac: Normal S1, S2; RRR; no murmurs, rubs, or gallops Lungs: Clear to auscultation bilaterally, no wheezing, rhonchi or rales  Abd: Soft, nontender, no hepatomegaly  Ext:  No edema, pulses 2+ Musculoskeletal: No deformities, BUE and BLE strength normal and equal Skin: Warm and dry, no rashes   Neuro: Alert and oriented to person, place, time, and situation, CNII-XII grossly intact, no focal deficits  Psych: Normal mood and affect   Labs  High Sensitivity Troponin:  No results for input(s): "TROPONINIHS" in the last 720 hours.   Cardiac EnzymesNo results for input(s): "TROPONINI" in the last 168 hours. No results for input(s): "TROPIPOC" in the last 168 hours.  Chemistry Recent Labs  Lab 02/14/22 1309 02/15/22 0547 02/16/22 0253 02/17/22 0446 02/18/22 0641 02/19/22 0128  NA 140 143   < > 144 142 141  K 4.6 4.3   < > 3.8 3.8 3.6  CL 103 107   < > 110 107 105  CO2 26 30   < > 27 25 27   GLUCOSE 127* 119*   < > 74 110* 103*  BUN 33* 35*   < > 29* 26* 25*  CREATININE 2.93* 2.76*   < > 2.45* 2.26* 2.28*  CALCIUM 8.8* 8.8*   < > 8.6* 8.9 9.2  PROT 5.8* 6.1*  --   --   --   --   ALBUMIN 3.0* 2.9*  --   --   --   --   AST 16 15  --   --   --   --   ALT 22 19  --   --   --   --   ALKPHOS 117 112   --   --   --   --   BILITOT 0.9 0.7  --   --   --   --   GFRNONAA 20* 21*   < > 25* 27* 27*  ANIONGAP 11 6   < > 7 10 9    < > = values in this interval not displayed.    Hematology Recent Labs  Lab 02/14/22 1309 02/15/22 0547  WBC 10.4 10.9*  RBC 4.54 4.47  HGB 11.7* 11.4*  HCT 36.1* 35.9*  MCV 79.5* 80.3  MCH 25.8* 25.5*  MCHC 32.4 31.8  RDW 18.1* 18.1*  PLT 149* 155   BNP Recent Labs  Lab 02/14/22 1309  BNP 472.4*    DDimer No results for input(s): "DDIMER" in the last 168 hours.   Radiology  No results found.  Cardiac Studies  TTE 01/14/2022  1. Left ventricular ejection fraction, by estimation, is 55 to 60%. The  left ventricle has normal function. The left ventricle has no regional  wall motion abnormalities. Left ventricular diastolic parameters are  indeterminate. There is the  interventricular septum is flattened in systole and diastole, consistent  with right ventricular pressure and volume overload.   2. Right ventricular systolic function is severely reduced. The right  ventricular size is severely enlarged. There is severely elevated  pulmonary artery systolic pressure. The estimated right ventricular  systolic pressure is 70.3 mmHg.   3. Right atrial size was severely dilated.   4. The mitral valve is normal in structure. Trivial mitral valve  regurgitation. No evidence of mitral stenosis.   5. Tricuspid valve regurgitation is severe.   6. The aortic valve is tricuspid. There is mild calcification of the  aortic valve. Aortic valve regurgitation is not visualized. Aortic valve  sclerosis/calcification is present, without any evidence of aortic  stenosis. Aortic valve area, by VTI measures   2.61 cm. Aortic valve mean gradient measures 3.0 mmHg. Aortic valve Vmax  measures 1.16 m/s.  7. The inferior vena cava is normal in size with greater than 50%  respiratory variability, suggesting right atrial pressure of 3 mmHg.   Patient Profile  Jacob Silva is a 88 y.o. male with CKD stage III-4, CAD status post CABG, cor pulmonale, pulmonary fibrosis, chronic respiratory failure on home O2, COPD, diabetes, OSA who was admitted on 02/14/2022 for acute on chronic diastolic heart failure.  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  No further IV diuresis.  Transition to torsemide 40 mg daily. -Biggest issue is right heart failure in the setting of lung disease.  Really limited options.  Not a candidate for advanced therapies in the setting of advanced age, frailty and significant CKD. -Agree with DNR. -Likely good candidate for hospice care.  #CAD status post CABG -No angina. -Continue home medications.  [facility] will sign off.   Medication Recommendations: As above Other recommendations (labs, testing, etc): None Follow up as an outpatient: We will arrange outpatient hospital follow-up in 2 to 4 weeks  For questions or updates, please contact [place] Please consult [email or web address] for contact info under     Signed, [place] T. Audie Box, MD, [place]  02/19/2022 9:10 AM

## 2022-02-19 NOTE — Progress Notes (Signed)
Pt Nsr on monitor and A/OX 4. OOB w/ assistance. Admitted for biventricular heart failure. See noted. Transition to PO torsemide. Pt had an episode of hypotension @ lunch time. Pt asymptomatic. Hospitalist and Cards notified. Meds adjusted. Rechecked bp 1623 90/53(65).   Hospitalist notified. See note from cards. Pt asymptomatic and states he feel good going home. Denies pain and  2L [place] intact (baseline). 02 sats >90%. Education provided on meds and follow up care. Pt adequate for discharge.

## 2022-02-19 NOTE — TOC Transition Note (Addendum)
Transition of Care [facility]) - CM/SW Discharge Note   Patient Details  Name: Jacob Silva MRN: [id number] Date of Birth: [birth year]  [facility] CM/SW Contact:  Zenon Mayo, RN Phone Number: 02/19/2022, 12:42 PM   Clinical Narrative:    Patient is for possible dc today, he did get a little nauseous today .  Wife is at bedside she will transport him home at dc.  NCM offered choice for [facility] agency with Medicare.gov list, she states they were working with [facility] before and would like to continue with them.  NCM made referral to [facility] with [facility] , she is able to take referral , soc will begin 24 to 48 hrs post dc. He has a rollator at home , he also has a Marine scientist with the [place] that comes by to check on him.  Staff RN states patient's bp was low today so  has a hold on his dc, he may not dc until tomorrow.   Final next level of care: [place] Barriers to Discharge: No Barriers Identified   Patient Goals and CMS Choice Patient states their goals for this hospitalization and ongoing recovery are:: return home CMS Medicare.gov Compare Post Acute Care list provided to:: Patient Represenative (must comment) Choice offered to / list presented to : Spouse  Discharge Placement                       Discharge Plan and Services                  DME Agency: NA       HH Arranged: PT, OT HH Agency: [place] Date [place]: 02/19/22 Time [place]: [id number] Representative spoke with at [place]: [place] Determinants of Health (SDOH) Interventions Food Insecurity Interventions: Intervention Not Indicated Financial Strain Interventions: Intervention Not Indicated Housing Interventions: Intervention Not Indicated Transportation Interventions: Intervention Not Indicated   Readmission Risk Interventions     No data to display

## 2022-03-04 ENCOUNTER — Other Ambulatory Visit (HOSPITAL_COMMUNITY): Payer: Self-pay

## 2022-03-05 ENCOUNTER — Telehealth (HOSPITAL_COMMUNITY): Payer: Self-pay | Admitting: *Deleted

## 2022-03-05 ENCOUNTER — Ambulatory Visit (HOSPITAL_COMMUNITY)
Admit: 2022-03-05 | Discharge: 2022-03-05 | Disposition: A | Discharge status: Home / Self Care | Payer: Medicare Other | Attending: Cardiology | Admitting: Cardiology

## 2022-03-05 ENCOUNTER — Encounter (HOSPITAL_COMMUNITY): Payer: Self-pay

## 2022-03-05 VITALS — BP 102/76 | HR 100 | Wt 211.8 lb

## 2022-03-05 DIAGNOSIS — G4733 Obstructive sleep apnea (adult) (pediatric): Secondary | ICD-10-CM | POA: Insufficient documentation

## 2022-03-05 DIAGNOSIS — N184 Chronic kidney disease, stage 4 (severe): Secondary | ICD-10-CM | POA: Insufficient documentation

## 2022-03-05 DIAGNOSIS — I5082 Biventricular heart failure: Secondary | ICD-10-CM | POA: Diagnosis present

## 2022-03-05 DIAGNOSIS — I5033 Acute on chronic diastolic (congestive) heart failure: Secondary | ICD-10-CM | POA: Insufficient documentation

## 2022-03-05 DIAGNOSIS — Z9981 Dependence on supplemental oxygen: Secondary | ICD-10-CM | POA: Insufficient documentation

## 2022-03-05 DIAGNOSIS — Z66 Do not resuscitate: Secondary | ICD-10-CM | POA: Diagnosis not present

## 2022-03-05 DIAGNOSIS — I272 Pulmonary hypertension, unspecified: Secondary | ICD-10-CM | POA: Diagnosis not present

## 2022-03-05 DIAGNOSIS — J449 Chronic obstructive pulmonary disease, unspecified: Secondary | ICD-10-CM | POA: Diagnosis not present

## 2022-03-05 DIAGNOSIS — I130 Hypertensive heart and chronic kidney disease with heart failure and stage 1 through stage 4 chronic kidney disease, or unspecified chronic kidney disease: Secondary | ICD-10-CM | POA: Diagnosis not present

## 2022-03-05 DIAGNOSIS — E1122 Type 2 diabetes mellitus with diabetic chronic kidney disease: Secondary | ICD-10-CM | POA: Insufficient documentation

## 2022-03-05 DIAGNOSIS — Z951 Presence of aortocoronary bypass graft: Secondary | ICD-10-CM | POA: Diagnosis not present

## 2022-03-05 DIAGNOSIS — J8410 Pulmonary fibrosis, unspecified: Secondary | ICD-10-CM | POA: Insufficient documentation

## 2022-03-05 DIAGNOSIS — I2510 Atherosclerotic heart disease of native coronary artery without angina pectoris: Secondary | ICD-10-CM | POA: Diagnosis not present

## 2022-03-05 DIAGNOSIS — I5020 Unspecified systolic (congestive) heart failure: Secondary | ICD-10-CM | POA: Diagnosis not present

## 2022-03-05 DIAGNOSIS — I2729 Other secondary pulmonary hypertension: Secondary | ICD-10-CM | POA: Insufficient documentation

## 2022-03-05 DIAGNOSIS — Z79899 Other long term (current) drug therapy: Secondary | ICD-10-CM | POA: Diagnosis not present

## 2022-03-05 DIAGNOSIS — R54 Age-related physical debility: Secondary | ICD-10-CM | POA: Insufficient documentation

## 2022-03-05 LAB — BASIC METABOLIC PANEL
Anion gap: 7 (ref 5–15)
BUN: 32 mg/dL — ABNORMAL HIGH (ref 8–23)
CO2: 29 mmol/L (ref 22–32)
Calcium: 9 mg/dL (ref 8.9–10.3)
Chloride: 107 mmol/L (ref 98–111)
Creatinine, Ser: 2.28 mg/dL — ABNORMAL HIGH (ref 0.61–1.24)
GFR, Estimated: 27 mL/min — ABNORMAL LOW (ref >60)
Glucose, Bld: 126 mg/dL — ABNORMAL HIGH (ref 70–99)
Potassium: 4.3 mmol/L (ref 3.5–5.1)
Sodium: 143 mmol/L (ref 135–145)

## 2022-03-05 NOTE — Addendum Note (Signed)
Encounter addended by: Consuelo Pandy, PA-C on: 03/05/2022 2:59 PM  Actions taken: Clinical Note Signed

## 2022-03-05 NOTE — Patient Instructions (Signed)
Medication Changes:  None, continue current medications  Lab Work:  Labs done today, your results will be available in MyChart, we will contact you for abnormal readings.  Testing/Procedures:  none  Referrals:  none  Special Instructions // Education:  Do the following things EVERYDAY: Weigh yourself in the morning before breakfast. Write it down and keep it in a log. Take your medicines as prescribed Eat low salt foods--Limit salt (sodium) to 2000 mg per day.  Stay as active as you can everyday Limit all fluids for the day to less than 2 liters   Follow-Up in: Thank you for allowing [place] to provider your heart failure care after your recent hospitalization. Please follow-up with Dr Humphrey Rolls as scheduled.

## 2022-03-05 NOTE — Telephone Encounter (Signed)
Call attempted to confirm HV TOC appt 03/05/2022 @ 2 pm. HIPPA appropriate VM left with callback number.    Earnestine Leys, BSN, Clinical cytogeneticist Only

## 2022-03-05 NOTE — Progress Notes (Addendum)
HEART & VASCULAR TRANSITION OF CARE CONSULT NOTE     Referring Physician: Dr. Darrick Meigs  Primary Care: [name], MD Primary Cardiologist: Dr. Angelena Form   HPI: Referred to clinic by Dr. Darrick Meigs for heart failure consultation.   Jacob Silva is a 88 y.o. male with CKD stage III-4, CAD status post CABG, cor pulmonale, pulmonary HTN, pulmonary fibrosis, chronic respiratory failure on home O2, COPD, Type 2 diabetes and OSA who was recently admitted on 02/14/2022 for acute on chronic diastolic heart failure.  2D echo showed normal LVEF 55-60%, RV severely enlarged w/ severely reduced systolic function and severely elevated RVSP, 69 mmHg, severe TR, RA severely dilated. Only trivial MR. He has primarily WHO Group 3 PH from underlying lung disease (COPD, Pulmonary Fibrosis and OSA).   He was diuresed w/ IV Lasix and transition to torsemide 40 mg daily. BP was too soft for GDMT. Coreg discontinued. Given advanced age, frailty and significant CKD Stage IV, he was felt not a candidate for advanced therapies. He was made DNR. D/c home on home O2 (2L/min). D/c Wt 222 lb.  Referred to [facility] clinic.   Presents today for f/u. Here w/ wife. She helps care for him. He has done relatively well since d/c. Remains on home O2 at 2L/min. Increases to 3-4L w/ activity as needed. Breathing has been stable. Wt also stable and down 11 lb since discharge. Denies LEE. Compliant w/ meds and diuretics. BP ok today at 102/76. Notes occasional positional dizziness if he stands too quickly. Denies falls. He has f/u w/ his primary cardiologist in [place] end of this week.   Given his cardiac condition, continuous supplemental O2 requirements and fragility,  he requires 24 hr care from a in-home caregiver.   Cardiac Testing   2D Echo  01/14/2022  1. Left ventricular ejection fraction, by estimation, is 55 to 60%. The  left ventricle has normal function. The left ventricle has no regional  wall motion abnormalities.  Left ventricular diastolic parameters are  indeterminate. There is the  interventricular septum is flattened in systole and diastole, consistent  with right ventricular pressure and volume overload.   2. Right ventricular systolic function is severely reduced. The right  ventricular size is severely enlarged. There is severely elevated  pulmonary artery systolic pressure. The estimated right ventricular  systolic pressure is 46.9 mmHg.   3. Right atrial size was severely dilated.   4. The mitral valve is normal in structure. Trivial mitral valve  regurgitation. No evidence of mitral stenosis.   5. Tricuspid valve regurgitation is severe.   6. The aortic valve is tricuspid. There is mild calcification of the  aortic valve. Aortic valve regurgitation is not visualized. Aortic valve  sclerosis/calcification is present, without any evidence of aortic  stenosis. Aortic valve area, by VTI measures   2.61 cm. Aortic valve mean gradient measures 3.0 mmHg. Aortic valve Vmax  measures 1.16 m/s.   7. The inferior vena cava is normal in size with greater than 50%  respiratory variability, suggesting right atrial pressure of 3 mmHg.   Review of Systems: [y] = yes, [ ]  = no   General: Weight gain [ ] ; Weight loss [ ] ; Anorexia [ ] ; Fatigue [ ] ; Fever [ ] ; Chills [ ] ; Weakness [ ]   Cardiac: Chest pain/pressure [ ] ; Resting SOB [ ] ; Exertional SOB [ Y]; Orthopnea [ ] ; Pedal Edema [ ] ; Palpitations [ ] ; Syncope [ ] ; Presyncope [ ] ; Paroxysmal nocturnal dyspnea[ ]   Pulmonary: Cough [ ] ; Wheezing[ ] ; Hemoptysis[ ] ; Sputum [ ] ; Snoring [ ]   GI: Vomiting[ ] ; Dysphagia[ ] ; Melena[ ] ; Hematochezia [ ] ; Heartburn[ ] ; Abdominal pain [ ] ; Constipation [ ] ; Diarrhea [ ] ; BRBPR [ ]   GU: Hematuria[ ] ; Dysuria [ ] ; Nocturia[ ]   Vascular: Pain in legs with walking [ ] ; Pain in feet with lying flat [ ] ; Non-healing sores [ ] ; Stroke [ ] ; TIA [ ] ; Slurred speech [ ] ;  Neuro: Headaches[ ] ; Vertigo[ ] ; Seizures[ ] ;  Paresthesias[ ] ;Blurred vision [ ] ; Diplopia [ ] ; Vision changes [ ]   Ortho/Skin: Arthritis [ ] ; Joint pain [ ] ; Muscle pain [ ] ; Joint swelling [ ] ; Back Pain [ ] ; Rash [ ]   Psych: Depression[ ] ; Anxiety[ ]   Heme: Bleeding problems [ ] ; Clotting disorders [ ] ; Anemia [ ]   Endocrine: Diabetes [ Y]; Thyroid dysfunction[ ]    Past Medical History:  Diagnosis Date   CAD (coronary artery disease)    a. 2013 s/p CABG x 3 (LIMA->dLAD, VG->RPDA, VG->OM1); b. 12/2021 NSTEMI (peak HsTrop 641)-->Med rx in setting of CKD IV. EF 55-60% w/ rwma by echo.   Chronic heart failure with preserved ejection fraction (HFpEF) ([place])    a. 12/2021 Echo: EF 55-60%, no rwma, sev RV dysfxn w/ RVSP 69.38mmHg. Sev dil RA. Triv MR. Ao sclerosis.   CKD (chronic kidney disease), stage IV (HCC)    COPD (chronic obstructive pulmonary disease) ([place])    a. Home O2.   Diabetes ([place])    Essential hypertension    Hyperlipidemia LDL goal <70    NSTEMI (non-ST elevated myocardial infarction) ([place]) 01/13/2022   OSA (obstructive sleep apnea)    Pulmonary fibrosis (HCC)     Current Outpatient Medications  Medication Sig Dispense Refill   acetaminophen (TYLENOL) 325 MG tablet Take 650 mg by mouth every 6 (six) hours as needed for mild pain.     albuterol (VENTOLIN HFA) 108 (90 Base) MCG/ACT inhaler Inhale 1 puff into the lungs every 6 (six) hours as needed for wheezing or shortness of breath.     allopurinol (ZYLOPRIM) 100 MG tablet Take 100 mg by mouth daily.     aspirin EC 81 MG tablet Take 81 mg by mouth daily.     atorvastatin (LIPITOR) 80 MG tablet Take 40 mg by mouth at bedtime.     clopidogrel (PLAVIX) 75 MG tablet Take 75 mg by mouth daily.     dapagliflozin propanediol (FARXIGA) 10 MG TABS tablet Take 10 mg by mouth daily.     diclofenac Sodium (VOLTAREN) 1 % GEL Apply 2 g topically 4 (four) times daily as needed (pain).     famotidine (PEPCID) 20 MG tablet Take 1 tablet (20 mg total) by mouth daily as needed for  heartburn or indigestion. 30 tablet 2   formoterol (PERFOROMIST) 20 MCG/2ML nebulizer solution Take 20 mcg by nebulization 2 (two) times daily.     gabapentin (NEURONTIN) 400 MG capsule Take 800 mg by mouth 2 (two) times daily.     isosorbide mononitrate (IMDUR) 30 MG 24 hr tablet Take 30 mg by mouth daily.     LANTUS SOLOSTAR 100 UNIT/ML Solostar Pen Inject 30 Units into the skin at bedtime.     levothyroxine (SYNTHROID) 100 MCG tablet Take 100 mcg by mouth every morning.     loperamide (IMODIUM) 2 MG capsule Take 2 mg by mouth daily as needed for diarrhea or loose stools.     loratadine (CLARITIN) 10 MG tablet  Take 10 mg by mouth daily as needed for allergies.     Propylene Glycol (SYSTANE BALANCE) 0.6 % SOLN Place 1 drop into both eyes daily as needed (dry eyes).     torsemide 40 MG TABS Take 40 mg by mouth daily. 30 tablet 2   umeclidinium-vilanterol (ANORO ELLIPTA) 62.5-25 MCG/INH AEPB Inhale 1 puff into the lungs daily.     No current facility-administered medications for this encounter.    Allergies  Allergen Reactions   Penicillin G Other (See Comments)   Penicillins Swelling   Tramadol     Other reaction(s): Dizziness      Social History   Socioeconomic History   Marital status: Married    Spouse name: Tye [place]   Number of children: 3   Years of education: Not on file   Highest education level: Doctorate  Occupational History   Occupation: Retired    Comment: Nature conservation officer  Tobacco Use   Smoking status: Former    Types: Cigarettes    Quit date: 1975    Years since quitting: 48.6   Smokeless tobacco: Never  Vaping Use   Vaping Use: Never used  Substance and Sexual Activity   Alcohol use: No   Drug use: No   Sexual activity: Not on file  Other Topics Concern   Not on file  Social History Narrative   Lives in [place] with his wife.  Does not routinely exercise.   Social Determinants of Health   Financial Resource Strain: Low Risk  (02/19/2022)   Overall  Financial Resource Strain (CARDIA)    Difficulty of Paying Living Expenses: Not very hard  Food Insecurity: No Food Insecurity (02/19/2022)   Hunger Vital Sign    Worried About Running Out of Food in the Last Year: Never true    Ran Out of Food in the Last Year: Never true  Transportation Needs: No Transportation Needs (02/19/2022)   PRAPARE - Hydrologist (Medical): No    Lack of Transportation (Non-Medical): No  Physical Activity: Not on file  Stress: Not on file  Social Connections: Not on file  Intimate Partner Violence: Not on file     No family history on file.  Vitals:   03/05/22 1405  BP: 102/76  Pulse: 100  SpO2: 95%  Weight: 96.1 kg (211 lb 12.8 oz)    PHYSICAL EXAM: General:  Well appearing, elderly male. No respiratory difficulty HEENT: normal Neck: supple. no JVD. Carotids 2+ bilat; no bruits. No lymphadenopathy or thryomegaly appreciated. Cor: PMI nondisplaced. Regular rate & rhythm. No rubs, gallops or murmurs. Lungs: bibasilar crackles c/l ILD  Abdomen: soft, nontender, nondistended. No hepatosplenomegaly. No bruits or masses. Good bowel sounds. Extremities: no cyanosis, clubbing, rash, edema, wearing TED hoses  Neuro: alert & oriented x 3, cranial nerves grossly intact. moves all 4 extremities w/o difficulty. Affect pleasant.  ECG: sinus tach, 104 bpm, RAD, +PVC    ASSESSMENT & PLAN:  Chronic Right Sided Heart Failure/ Cor Pulmonale and Pulmonary HTN - Echo 6/23: 55-60%, RV severely enlarged w/ severely reduced systolic function and severely elevated RVSP, 69 mmHg, severe TR, RA severely dilated. Only trivial MR. - suspect primarily WHO Group 3 PH from underlying lung disease w/ known COPD, pulmonary fibrosis and OSA  - probable WHO Group 2 component w/ known diastolic dysfunction  - treatment will be w/ diuretics, supp O2, CPAP and bronchodilators - NYHA Class III confounded by advanced age and deconditioning. Volume status ok  on exam.  Wt down 11 lb post d/c - continue torsemide 20 mg daily  - continue Farxiga 10 mg daily  - no digoxin given age and CKD  - advised 2L/day fluid restriction . Can take extra torsemide PRN if > 3 lb wt gain in 24 hrs or > 5 lb in 1 wk - continue w/ TED hoses for LE compression  - agree he is not candidate for advanced therapies given advanced age, fragility and CKD  2. Severe TR - likely functional in setting of dilated RV  - not candidate for percutaneous valve repair for reasons outlined above  3. CKD Stage IV  - followed by nephrology  - on SGLT2i (Farxiga) - BMP today   4. COPD and Pulmonary Fibrosis - on home O2, 2L/min b/l w/ stable sats - followed by pulmonology   5. OSA - compliant w/ CPAP   NYHA III GDMT  Diuretic- Torsemide 20 mg daily  BB- No (did not tolerate due to hypotension) Ace/ARB/ARNI No (hypotension and CKD) MRA No CKD  SGLT2i Yes, Farxiga 10 mg daily     Referred to HFSW (PCP, Medications, Transportation, ETOH Abuse, Drug Abuse, Insurance, Museum/gallery curator ): No  Refer to Pharmacy:  No Refer to Home Health: Has home health aid  Refer to Advanced Heart Failure Clinic: No   Refer to General Cardiology: Yes   Follow up : keep f/u w/ cardiologist in [place] this week

## 2022-05-16 ENCOUNTER — Ambulatory Visit (INDEPENDENT_AMBULATORY_CARE_PROVIDER_SITE_OTHER): Payer: No Typology Code available for payment source | Admitting: Podiatry

## 2022-05-16 ENCOUNTER — Encounter: Payer: Self-pay | Admitting: Podiatry

## 2022-05-16 DIAGNOSIS — M79675 Pain in left toe(s): Secondary | ICD-10-CM

## 2022-05-16 DIAGNOSIS — B351 Tinea unguium: Secondary | ICD-10-CM | POA: Diagnosis not present

## 2022-05-16 DIAGNOSIS — M79674 Pain in right toe(s): Secondary | ICD-10-CM

## 2022-05-16 DIAGNOSIS — E1142 Type 2 diabetes mellitus with diabetic polyneuropathy: Secondary | ICD-10-CM

## 2022-05-16 DIAGNOSIS — D689 Coagulation defect, unspecified: Secondary | ICD-10-CM

## 2022-05-16 NOTE — Progress Notes (Signed)
This patient presents the office for an at risk foot care.  Patient requires his today by a professional for this patient will be at risk.  Patient is at risk due to coagulation disorder ESRD and DM.  Patient is presently taking Plavix.    He says he is unable to trim his nails since he cannot reach his nails.  He presents the office today for at risk  foot care.     General Appearance  Alert, conversant and in no acute stress.  Vascular  Dorsalis pedis and posterior tibial  pulses are palpable  bilaterally.  Capillary return is within normal limits  bilaterally. Temperature is within normal limits  bilaterally.  Neurologic  Senn-Weinstein monofilament wire test within normal limits right foot.  Absent  LOPS left foot. Muscle power within normal limits bilaterally.  Nails Thick disfigured discolored nails with subungual debris  from second to fifth toes bilaterally. No evidence of bacterial infection or drainage bilaterally.  Orthopedic  No limitations of motion  feet .  No crepitus or effusions noted.  No bony pathology or digital deformities noted.  Skin  normotropic skin with no porokeratosis noted bilaterally.  No signs of infections or ulcers noted.    Onychomycosis  B/L.  Pain in toes  B/L.     Debridement and grinding of long thick painful nails.   Debride nails with nail nipper followed by dremel tool.Told this patient the importance of periodic foot evaluation this will help reduce the potential complications of his feet.  RTC 3 months.   Gardiner Barefoot DPM

## 2022-06-11 ENCOUNTER — Emergency Department
Admission: EM | Admit: 2022-06-11 | Discharge: 2022-06-11 | Disposition: A | Discharge status: Home / Self Care | Payer: No Typology Code available for payment source | Attending: Student in an Organized Health Care Education/Training Program | Admitting: Student in an Organized Health Care Education/Training Program

## 2022-06-11 ENCOUNTER — Emergency Department: Payer: No Typology Code available for payment source

## 2022-06-11 ENCOUNTER — Other Ambulatory Visit: Payer: Self-pay

## 2022-06-11 DIAGNOSIS — I509 Heart failure, unspecified: Secondary | ICD-10-CM | POA: Insufficient documentation

## 2022-06-11 DIAGNOSIS — R202 Paresthesia of skin: Secondary | ICD-10-CM | POA: Insufficient documentation

## 2022-06-11 LAB — BASIC METABOLIC PANEL
Anion gap: 7 (ref 5–15)
BUN: 36 mg/dL — ABNORMAL HIGH (ref 8–23)
CO2: 30 mmol/L (ref 22–32)
Calcium: 9.1 mg/dL (ref 8.9–10.3)
Chloride: 107 mmol/L (ref 98–111)
Creatinine, Ser: 2.78 mg/dL — ABNORMAL HIGH (ref 0.61–1.24)
GFR, Estimated: 21 mL/min — ABNORMAL LOW (ref >60)
Glucose, Bld: 82 mg/dL (ref 70–99)
Potassium: 4.1 mmol/L (ref 3.5–5.1)
Sodium: 144 mmol/L (ref 135–145)

## 2022-06-11 LAB — CBC
HCT: 38.1 % — ABNORMAL LOW (ref 39.0–52.0)
Hemoglobin: 12.3 g/dL — ABNORMAL LOW (ref 13.0–17.0)
MCH: 25.4 pg — ABNORMAL LOW (ref 26.0–34.0)
MCHC: 32.3 g/dL (ref 30.0–36.0)
MCV: 78.7 fL — ABNORMAL LOW (ref 80.0–100.0)
Platelets: 193 10*3/uL (ref 150–400)
RBC: 4.84 MIL/uL (ref 4.22–5.81)
RDW: 18.4 % — ABNORMAL HIGH (ref 11.5–15.5)
WBC: 11 10*3/uL — ABNORMAL HIGH (ref 4.0–10.5)
nRBC: 0 % (ref 0.0–0.2)

## 2022-06-11 LAB — BRAIN NATRIURETIC PEPTIDE: B Natriuretic Peptide: 490.1 pg/mL — ABNORMAL HIGH (ref 0.0–100.0)

## 2022-06-11 LAB — TROPONIN I (HIGH SENSITIVITY)
Troponin I (High Sensitivity): 17 ng/L (ref <18)
Troponin I (High Sensitivity): 16 ng/L (ref <18)

## 2022-06-11 NOTE — ED Triage Notes (Addendum)
Pt here via [facility] with numbness and tingling in his left hand since 0815. Pt also states the left side of his face felt numb. Pt states his face is not as numb at the moment. Pt denies pain or weakness. Wife states pt was walking fine so no leg numbness. Pt has hx of COPD and HF. Pt had prior strokes and MI in the past.

## 2022-06-11 NOTE — ED Triage Notes (Signed)
Ems report: pt ems from home for tingling left hand. Pt a/o, vss, cbg 132. Pt on 2LNC all the time.

## 2022-06-11 NOTE — ED Provider Notes (Signed)
[facility] Provider Note    Event Date/Time   First MD Initiated Contact with Patient 06/11/22 1106     (approximate)   History   Numbness   HPI  Jacob Silva is a 88 y.o. male history of CHF as well as CVA presents to the ER for evaluation of tingling sensation in his left hand as well as left face when he first woke up was brief in nature.  Still having some mild tingling in the tips of his fingers.  Does not remember the deficits from his previous stroke he denies any chest pain or shortness of breath.  No fevers no chills.  Seen normal was yesterday evening.     Physical Exam   Triage Vital Signs: ED Triage Vitals  Enc Vitals Group     BP 06/11/22 1051 105/63     Pulse Rate 06/11/22 1051 72     Resp 06/11/22 1051 18     Temp 06/11/22 1051 98.5 F (36.9 C)     Temp Source 06/11/22 1051 Oral     SpO2 06/11/22 1051 99 %     Weight 06/11/22 1052 211 lb 13.8 oz (96.1 kg)     Height 06/11/22 1052 5\' 11"  (1.803 m)     Head Circumference --      Peak Flow --      Pain Score 06/11/22 1051 0     Pain Loc --      Pain Edu? --      Excl. in [place]? --     Most recent vital signs: Vitals:   06/11/22 1430 06/11/22 1518  BP: 117/76   Pulse: 71   Resp: 18   Temp:  98.5 F (36.9 C)  SpO2: 97%      Constitutional: Alert  Eyes: Conjunctivae are normal.  Head: Atraumatic. Nose: No congestion/rhinnorhea. Mouth/Throat: Mucous membranes are moist.   Neck: Painless ROM.  Cardiovascular:   Good peripheral circulation. Respiratory: Normal respiratory effort.  No retractions.  Gastrointestinal: Soft and nontender.  Musculoskeletal:  no deformity Neurologic:  CN- intact.  No facial droop, Normal FNF.  Normal heel to shin.  Sensation intact bilaterally. Normal speech and language. No gross focal neurologic deficits are appreciated. No gait instability.  Skin:  Skin is warm, dry and intact. No rash noted. Psychiatric: Mood and affect are normal.  Speech and behavior are normal.    ED Results / Procedures / Treatments   Labs (all labs ordered are listed, but only abnormal results are displayed) Labs Reviewed  BASIC METABOLIC PANEL - Abnormal; Notable for the following components:      Result Value   BUN 36 (*)    Creatinine, Ser 2.78 (*)    GFR, Estimated 21 (*)    All other components within normal limits  CBC - Abnormal; Notable for the following components:   WBC 11.0 (*)    Hemoglobin 12.3 (*)    HCT 38.1 (*)    MCV 78.7 (*)    MCH 25.4 (*)    RDW 18.4 (*)    All other components within normal limits  BRAIN NATRIURETIC PEPTIDE - Abnormal; Notable for the following components:   B Natriuretic Peptide 490.1 (*)    All other components within normal limits  URINALYSIS, ROUTINE W REFLEX MICROSCOPIC  CBG MONITORING, ED  TROPONIN I (HIGH SENSITIVITY)  TROPONIN I (HIGH SENSITIVITY)     EKG  ED ECG REPORT I, Merlyn Lot, the attending physician, personally viewed  and interpreted this ECG.   Date: 06/11/2022  EKG Time: 15:52  Rate: 75  Rhythm: sinus  Axis: right  Intervals:normal  ST&T Change: nonspecific t wave abn    RADIOLOGY Please see ED Course for my review and interpretation.  I personally reviewed all radiographic images ordered to evaluate for the above acute complaints and reviewed radiology reports and findings.  These findings were personally discussed with the patient.  Please see medical record for radiology report.    PROCEDURES:  Critical Care performed: No  Procedures   MEDICATIONS ORDERED IN ED: Medications - No data to display   IMPRESSION / MDM / [place] / ED COURSE  I reviewed the triage vital signs and the nursing notes.                              Differential diagnosis includes, but is not limited to, cva, tia, hypoglycemia, dehydration, electrolyte abnormality, dissection, sepsis  Patient presenting to the ER for evaluation of symptoms as described  above.  Based on symptoms, risk factors and considered above differential, this presenting complaint could reflect a potentially life-threatening illness therefore the patient will be placed on continuous pulse oximetry and telemetry for monitoring.  Laboratory evaluation will be sent to evaluate for the above complaints.  Patient last seen normal was last night.  He does not have any objective findings on exam.  Do not feel this is a code stroke.  Given his history imaging will be ordered for the above differential possible TIA versus possible radiculopathy.  Have lower suspicion for cardiac etiology but will order serial enzymes.    Clinical Course as of 06/11/22 1558  Tue Jun 11, 2022  1146 CT head on my review and interpretation does not show any evidence of SDH or IPH. [PR]  1308 First troponin is negative.  Awaiting MRI given his symptoms. [PR]  1531 Patient reassessed.  His MRI does not show any evidence of CVA.  Does have significant radicular disease on his MRI cervical spine.  Patient reassessed feels well.  At this point given his reassuring work-up I think he is appropriate for outpatient follow-up.  Patient agreeable to plan. [PR]    Clinical Course User Index [PR] Merlyn Lot, MD    FINAL CLINICAL IMPRESSION(S) / ED DIAGNOSES   Final diagnoses:  Paresthesia     Rx / DC Orders   ED Discharge Orders     None        Note:  This document was prepared using Dragon voice recognition software and may include unintentional dictation errors.    Merlyn Lot, MD 06/11/22 [phone]

## 2022-06-11 NOTE — ED Notes (Signed)
Patient transported to MRI 

## 2022-06-11 NOTE — ED Notes (Signed)
CBG 86 per pt's blood sugar monitor.

## 2022-07-12 ENCOUNTER — Ambulatory Visit: Payer: Medicare Other | Admitting: Physician Assistant

## 2022-08-11 ENCOUNTER — Inpatient Hospital Stay
Admission: EM | Admit: 2022-08-11 | Discharge: 2022-08-16 | DRG: 291 | Disposition: A | Discharge status: Home Health | Payer: No Typology Code available for payment source | Attending: Internal Medicine | Admitting: Internal Medicine

## 2022-08-11 ENCOUNTER — Other Ambulatory Visit: Payer: Self-pay

## 2022-08-11 ENCOUNTER — Emergency Department: Payer: No Typology Code available for payment source

## 2022-08-11 ENCOUNTER — Encounter: Payer: Self-pay | Admitting: Internal Medicine

## 2022-08-11 DIAGNOSIS — I5082 Biventricular heart failure: Secondary | ICD-10-CM | POA: Diagnosis present

## 2022-08-11 DIAGNOSIS — Z888 Allergy status to other drugs, medicaments and biological substances status: Secondary | ICD-10-CM

## 2022-08-11 DIAGNOSIS — Z951 Presence of aortocoronary bypass graft: Secondary | ICD-10-CM | POA: Diagnosis not present

## 2022-08-11 DIAGNOSIS — J8410 Pulmonary fibrosis, unspecified: Secondary | ICD-10-CM | POA: Diagnosis present

## 2022-08-11 DIAGNOSIS — Z515 Encounter for palliative care: Secondary | ICD-10-CM

## 2022-08-11 DIAGNOSIS — J440 Chronic obstructive pulmonary disease with acute lower respiratory infection: Secondary | ICD-10-CM | POA: Diagnosis present

## 2022-08-11 DIAGNOSIS — Z87891 Personal history of nicotine dependence: Secondary | ICD-10-CM

## 2022-08-11 DIAGNOSIS — Z66 Do not resuscitate: Secondary | ICD-10-CM | POA: Diagnosis present

## 2022-08-11 DIAGNOSIS — Z1152 Encounter for screening for COVID-19: Secondary | ICD-10-CM | POA: Diagnosis not present

## 2022-08-11 DIAGNOSIS — Z79899 Other long term (current) drug therapy: Secondary | ICD-10-CM

## 2022-08-11 DIAGNOSIS — J449 Chronic obstructive pulmonary disease, unspecified: Secondary | ICD-10-CM | POA: Diagnosis present

## 2022-08-11 DIAGNOSIS — Z9981 Dependence on supplemental oxygen: Secondary | ICD-10-CM | POA: Diagnosis not present

## 2022-08-11 DIAGNOSIS — Z833 Family history of diabetes mellitus: Secondary | ICD-10-CM

## 2022-08-11 DIAGNOSIS — E039 Hypothyroidism, unspecified: Secondary | ICD-10-CM | POA: Diagnosis present

## 2022-08-11 DIAGNOSIS — N184 Chronic kidney disease, stage 4 (severe): Secondary | ICD-10-CM | POA: Diagnosis present

## 2022-08-11 DIAGNOSIS — E785 Hyperlipidemia, unspecified: Secondary | ICD-10-CM | POA: Diagnosis present

## 2022-08-11 DIAGNOSIS — R54 Age-related physical debility: Secondary | ICD-10-CM | POA: Diagnosis present

## 2022-08-11 DIAGNOSIS — E876 Hypokalemia: Secondary | ICD-10-CM | POA: Diagnosis not present

## 2022-08-11 DIAGNOSIS — E1122 Type 2 diabetes mellitus with diabetic chronic kidney disease: Secondary | ICD-10-CM | POA: Diagnosis present

## 2022-08-11 DIAGNOSIS — J9611 Chronic respiratory failure with hypoxia: Secondary | ICD-10-CM | POA: Diagnosis present

## 2022-08-11 DIAGNOSIS — I2510 Atherosclerotic heart disease of native coronary artery without angina pectoris: Secondary | ICD-10-CM | POA: Diagnosis present

## 2022-08-11 DIAGNOSIS — I2781 Cor pulmonale (chronic): Secondary | ICD-10-CM | POA: Diagnosis present

## 2022-08-11 DIAGNOSIS — I5A Non-ischemic myocardial injury (non-traumatic): Secondary | ICD-10-CM | POA: Diagnosis present

## 2022-08-11 DIAGNOSIS — T501X5A Adverse effect of loop [high-ceiling] diuretics, initial encounter: Secondary | ICD-10-CM | POA: Diagnosis not present

## 2022-08-11 DIAGNOSIS — I2489 Other forms of acute ischemic heart disease: Secondary | ICD-10-CM | POA: Diagnosis present

## 2022-08-11 DIAGNOSIS — Z794 Long term (current) use of insulin: Secondary | ICD-10-CM | POA: Diagnosis not present

## 2022-08-11 DIAGNOSIS — E1129 Type 2 diabetes mellitus with other diabetic kidney complication: Secondary | ICD-10-CM | POA: Diagnosis present

## 2022-08-11 DIAGNOSIS — G4733 Obstructive sleep apnea (adult) (pediatric): Secondary | ICD-10-CM | POA: Diagnosis present

## 2022-08-11 DIAGNOSIS — Z808 Family history of malignant neoplasm of other organs or systems: Secondary | ICD-10-CM

## 2022-08-11 DIAGNOSIS — M109 Gout, unspecified: Secondary | ICD-10-CM | POA: Diagnosis present

## 2022-08-11 DIAGNOSIS — I130 Hypertensive heart and chronic kidney disease with heart failure and stage 1 through stage 4 chronic kidney disease, or unspecified chronic kidney disease: Principal | ICD-10-CM | POA: Diagnosis present

## 2022-08-11 DIAGNOSIS — J189 Pneumonia, unspecified organism: Secondary | ICD-10-CM | POA: Diagnosis present

## 2022-08-11 DIAGNOSIS — Z79890 Hormone replacement therapy (postmenopausal): Secondary | ICD-10-CM

## 2022-08-11 DIAGNOSIS — I2729 Other secondary pulmonary hypertension: Secondary | ICD-10-CM | POA: Diagnosis present

## 2022-08-11 DIAGNOSIS — I252 Old myocardial infarction: Secondary | ICD-10-CM

## 2022-08-11 DIAGNOSIS — R609 Edema, unspecified: Principal | ICD-10-CM

## 2022-08-11 DIAGNOSIS — I5033 Acute on chronic diastolic (congestive) heart failure: Secondary | ICD-10-CM | POA: Diagnosis present

## 2022-08-11 DIAGNOSIS — Z7984 Long term (current) use of oral hypoglycemic drugs: Secondary | ICD-10-CM

## 2022-08-11 DIAGNOSIS — Z8249 Family history of ischemic heart disease and other diseases of the circulatory system: Secondary | ICD-10-CM

## 2022-08-11 DIAGNOSIS — E8770 Fluid overload, unspecified: Secondary | ICD-10-CM

## 2022-08-11 DIAGNOSIS — Z841 Family history of disorders of kidney and ureter: Secondary | ICD-10-CM

## 2022-08-11 DIAGNOSIS — R112 Nausea with vomiting, unspecified: Secondary | ICD-10-CM | POA: Diagnosis present

## 2022-08-11 DIAGNOSIS — Z880 Allergy status to penicillin: Secondary | ICD-10-CM

## 2022-08-11 DIAGNOSIS — J441 Chronic obstructive pulmonary disease with (acute) exacerbation: Secondary | ICD-10-CM | POA: Insufficient documentation

## 2022-08-11 DIAGNOSIS — Z7951 Long term (current) use of inhaled steroids: Secondary | ICD-10-CM

## 2022-08-11 DIAGNOSIS — Z7902 Long term (current) use of antithrombotics/antiplatelets: Secondary | ICD-10-CM

## 2022-08-11 DIAGNOSIS — Z7982 Long term (current) use of aspirin: Secondary | ICD-10-CM

## 2022-08-11 LAB — TROPONIN I (HIGH SENSITIVITY)
Troponin I (High Sensitivity): 23 ng/L — ABNORMAL HIGH (ref <18)
Troponin I (High Sensitivity): 23 ng/L — ABNORMAL HIGH (ref <18)

## 2022-08-11 LAB — CBC
HCT: 37.6 % — ABNORMAL LOW (ref 39.0–52.0)
Hemoglobin: 12 g/dL — ABNORMAL LOW (ref 13.0–17.0)
MCH: 24.3 pg — ABNORMAL LOW (ref 26.0–34.0)
MCHC: 31.9 g/dL (ref 30.0–36.0)
MCV: 76.3 fL — ABNORMAL LOW (ref 80.0–100.0)
Platelets: 194 10*3/uL (ref 150–400)
RBC: 4.93 MIL/uL (ref 4.22–5.81)
RDW: 18.3 % — ABNORMAL HIGH (ref 11.5–15.5)
WBC: 11.4 10*3/uL — ABNORMAL HIGH (ref 4.0–10.5)
nRBC: 0 % (ref 0.0–0.2)

## 2022-08-11 LAB — CBG MONITORING, ED
Glucose-Capillary: 109 mg/dL — ABNORMAL HIGH (ref 70–99)
Glucose-Capillary: 97 mg/dL (ref 70–99)

## 2022-08-11 LAB — RESP PANEL BY RT-PCR (RSV, FLU A&B, COVID)  RVPGX2
Influenza A by PCR: NEGATIVE
Influenza B by PCR: NEGATIVE
Resp Syncytial Virus by PCR: NEGATIVE
SARS Coronavirus 2 by RT PCR: NEGATIVE

## 2022-08-11 LAB — BASIC METABOLIC PANEL
Anion gap: 9 (ref 5–15)
BUN: 34 mg/dL — ABNORMAL HIGH (ref 8–23)
CO2: 31 mmol/L (ref 22–32)
Calcium: 8.7 mg/dL — ABNORMAL LOW (ref 8.9–10.3)
Chloride: 98 mmol/L (ref 98–111)
Creatinine, Ser: 2.68 mg/dL — ABNORMAL HIGH (ref 0.61–1.24)
GFR, Estimated: 22 mL/min — ABNORMAL LOW (ref >60)
Glucose, Bld: 117 mg/dL — ABNORMAL HIGH (ref 70–99)
Potassium: 3.8 mmol/L (ref 3.5–5.1)
Sodium: 138 mmol/L (ref 135–145)

## 2022-08-11 LAB — PROCALCITONIN: Procalcitonin: 2.87 ng/mL

## 2022-08-11 LAB — BRAIN NATRIURETIC PEPTIDE: B Natriuretic Peptide: 884.1 pg/mL — ABNORMAL HIGH (ref 0.0–100.0)

## 2022-08-11 LAB — MAGNESIUM: Magnesium: 2.4 mg/dL (ref 1.7–2.4)

## 2022-08-11 MED ORDER — INSULIN ASPART 100 UNIT/ML IJ SOLN
0.0000 [IU] | Freq: Every day | INTRAMUSCULAR | Status: DC
  Administered 2022-08-11: 0 [IU] via SUBCUTANEOUS

## 2022-08-11 MED ORDER — AZITHROMYCIN 500 MG PO TABS
500.0000 mg | ORAL_TABLET | Freq: Every day | ORAL | Status: AC
  Administered 2022-08-11: 500 mg via ORAL
  Filled 2022-08-11: qty 1

## 2022-08-11 MED ORDER — LACTATED RINGERS IV SOLN: INTRAVENOUS | Status: DC

## 2022-08-11 MED ORDER — AZITHROMYCIN 500 MG PO TABS
250.0000 mg | ORAL_TABLET | Freq: Every day | ORAL | Status: DC
  Administered 2022-08-12: 250 mg via ORAL
  Filled 2022-08-11: qty 1

## 2022-08-11 MED ORDER — ATORVASTATIN CALCIUM 20 MG PO TABS
40.0000 mg | ORAL_TABLET | Freq: Every day | ORAL | Status: DC
  Administered 2022-08-11 – 2022-08-15 (×5): 40 mg via ORAL
  Filled 2022-08-11 (×5): qty 2

## 2022-08-11 MED ORDER — INSULIN ASPART 100 UNIT/ML IJ SOLN
0.0000 [IU] | Freq: Three times a day (TID) | INTRAMUSCULAR | Status: DC
  Administered 2022-08-14: 1 [IU] via SUBCUTANEOUS
  Administered 2022-08-16: 2 [IU] via SUBCUTANEOUS
  Filled 2022-08-11 (×2): qty 1

## 2022-08-11 MED ORDER — LORATADINE 10 MG PO TABS: 10.0000 mg | ORAL_TABLET | Freq: Every day | ORAL | Status: DC | PRN

## 2022-08-11 MED ORDER — ENOXAPARIN SODIUM 30 MG/0.3ML IJ SOSY
30.0000 mg | PREFILLED_SYRINGE | INTRAMUSCULAR | Status: DC
  Administered 2022-08-11 – 2022-08-15 (×5): 30 mg via SUBCUTANEOUS
  Filled 2022-08-11 (×5): qty 0.3

## 2022-08-11 MED ORDER — INSULIN GLARGINE-YFGN 100 UNIT/ML ~~LOC~~ SOLN
20.0000 [IU] | Freq: Every day | SUBCUTANEOUS | Status: DC
  Administered 2022-08-12 – 2022-08-15 (×4): 20 [IU] via SUBCUTANEOUS
  Filled 2022-08-11 (×6): qty 0.2

## 2022-08-11 MED ORDER — LEVOFLOXACIN IN D5W 750 MG/150ML IV SOLN: 750.0000 mg | Freq: Once | INTRAVENOUS | Status: DC

## 2022-08-11 MED ORDER — ALLOPURINOL 100 MG PO TABS
50.0000 mg | ORAL_TABLET | ORAL | Status: DC
  Administered 2022-08-13 – 2022-08-15 (×2): 50 mg via ORAL
  Filled 2022-08-11: qty 1
  Filled 2022-08-11 (×2): qty 0.5
  Filled 2022-08-11: qty 1

## 2022-08-11 MED ORDER — DM-GUAIFENESIN ER 30-600 MG PO TB12: 1.0000 | ORAL_TABLET | Freq: Two times a day (BID) | ORAL | Status: DC | PRN

## 2022-08-11 MED ORDER — ALBUTEROL SULFATE (2.5 MG/3ML) 0.083% IN NEBU
2.5000 mg | INHALATION_SOLUTION | RESPIRATORY_TRACT | Status: DC | PRN
  Filled 2022-08-11: qty 3

## 2022-08-11 MED ORDER — GABAPENTIN 300 MG PO CAPS
300.0000 mg | ORAL_CAPSULE | Freq: Two times a day (BID) | ORAL | Status: DC
  Administered 2022-08-11 – 2022-08-16 (×10): 300 mg via ORAL
  Filled 2022-08-11 (×10): qty 1

## 2022-08-11 MED ORDER — UMECLIDINIUM-VILANTEROL 62.5-25 MCG/ACT IN AEPB
1.0000 | INHALATION_SPRAY | Freq: Every day | RESPIRATORY_TRACT | Status: DC
  Administered 2022-08-12 – 2022-08-16 (×5): 1 via RESPIRATORY_TRACT
  Filled 2022-08-11: qty 14

## 2022-08-11 MED ORDER — IPRATROPIUM-ALBUTEROL 0.5-2.5 (3) MG/3ML IN SOLN
3.0000 mL | RESPIRATORY_TRACT | Status: DC
  Filled 2022-08-11: qty 3

## 2022-08-11 MED ORDER — LOPERAMIDE HCL 2 MG PO CAPS: 2.0000 mg | ORAL_CAPSULE | Freq: Every day | ORAL | Status: DC | PRN

## 2022-08-11 MED ORDER — FAMOTIDINE 20 MG PO TABS: 10.0000 mg | ORAL_TABLET | Freq: Every day | ORAL | Status: DC | PRN

## 2022-08-11 MED ORDER — ARFORMOTEROL TARTRATE 15 MCG/2ML IN NEBU
15.0000 ug | INHALATION_SOLUTION | Freq: Two times a day (BID) | RESPIRATORY_TRACT | Status: DC
  Administered 2022-08-11 – 2022-08-16 (×10): 15 ug via RESPIRATORY_TRACT
  Filled 2022-08-11 (×11): qty 2

## 2022-08-11 MED ORDER — ISOSORBIDE MONONITRATE ER 30 MG PO TB24
30.0000 mg | ORAL_TABLET | Freq: Every day | ORAL | Status: DC
  Administered 2022-08-12 – 2022-08-16 (×5): 30 mg via ORAL
  Filled 2022-08-11 (×5): qty 1

## 2022-08-11 MED ORDER — POLYVINYL ALCOHOL 1.4 % OP SOLN
1.0000 [drp] | Freq: Every day | OPHTHALMIC | Status: DC | PRN
  Administered 2022-08-14: 1 [drp] via OPHTHALMIC
  Filled 2022-08-11: qty 15

## 2022-08-11 MED ORDER — ASPIRIN 81 MG PO TBEC
81.0000 mg | DELAYED_RELEASE_TABLET | Freq: Every day | ORAL | Status: DC
  Administered 2022-08-12 – 2022-08-16 (×5): 81 mg via ORAL
  Filled 2022-08-11 (×5): qty 1

## 2022-08-11 MED ORDER — FUROSEMIDE 10 MG/ML IJ SOLN
60.0000 mg | Freq: Once | INTRAMUSCULAR | Status: AC
  Administered 2022-08-11: 60 mg via INTRAVENOUS
  Filled 2022-08-11: qty 8

## 2022-08-11 MED ORDER — LEVOTHYROXINE SODIUM 100 MCG PO TABS
100.0000 ug | ORAL_TABLET | Freq: Every morning | ORAL | Status: DC
  Administered 2022-08-12 – 2022-08-16 (×5): 100 ug via ORAL
  Filled 2022-08-11 (×5): qty 1

## 2022-08-11 MED ORDER — FUROSEMIDE 10 MG/ML IJ SOLN
60.0000 mg | Freq: Two times a day (BID) | INTRAMUSCULAR | Status: DC
  Administered 2022-08-12 – 2022-08-16 (×8): 60 mg via INTRAVENOUS
  Filled 2022-08-11: qty 6
  Filled 2022-08-11: qty 8
  Filled 2022-08-11 (×2): qty 6
  Filled 2022-08-11 (×2): qty 8
  Filled 2022-08-11 (×3): qty 6

## 2022-08-11 MED ORDER — CLOPIDOGREL BISULFATE 75 MG PO TABS
75.0000 mg | ORAL_TABLET | Freq: Every day | ORAL | Status: DC
  Administered 2022-08-12 – 2022-08-16 (×5): 75 mg via ORAL
  Filled 2022-08-11 (×5): qty 1

## 2022-08-11 NOTE — ED Provider Notes (Signed)
[facility] Provider Note    Event Date/Time   First MD Initiated Contact with Patient 08/11/22 1354     (approximate)   History   Weight gain   HPI  Jacob Silva is a 89 y.o. male  who presents to the emergency department today with primary concern for weight gain and fluid overload.  He says he has history of heart failure and that he weighs himself everyday. Over the past couple of days he has gained roughly 9 pounds. This has been accompanied by some shortness of breath and chest discomfort. Has also noticed swelling in his legs. The patient states that this reminds him of previous episodes of fluid overload. He denies any fevers.       Physical Exam   Triage Vital Signs: ED Triage Vitals  Enc Vitals Group     BP 08/11/22 1127 (!) 143/76     Pulse Rate 08/11/22 1127 82     Resp 08/11/22 1127 20     Temp 08/11/22 1127 97.7 F (36.5 C)     Temp Source 08/11/22 1127 Oral     SpO2 08/11/22 1127 95 %     Weight 08/11/22 1129 213 lb (96.6 kg)     Height 08/11/22 1129 5\' 11"  (1.803 m)     Head Circumference --      Peak Flow --      Pain Score 08/11/22 1128 6     Pain Loc --      Pain Edu? --      Excl. in [place]? --     Most recent vital signs: Vitals:   08/11/22 1127  BP: (!) 143/76  Pulse: 82  Resp: 20  Temp: 97.7 F (36.5 C)  SpO2: 95%   General: Awake, alert, oriented. CV:  Good peripheral perfusion. Regular rate and rhythm. Resp:  Normal effort. Lungs clear. Abd:  No distention.  Ext:  Bilateral lower extremity edema   ED Results / Procedures / Treatments   Labs (all labs ordered are listed, but only abnormal results are displayed) Labs Reviewed  BASIC METABOLIC PANEL - Abnormal; Notable for the following components:      Result Value   Glucose, Bld 117 (*)    BUN 34 (*)    Creatinine, Ser 2.68 (*)    Calcium 8.7 (*)    GFR, Estimated 22 (*)    All other components within normal limits  CBC - Abnormal; Notable for the  following components:   WBC 11.4 (*)    Hemoglobin 12.0 (*)    HCT 37.6 (*)    MCV 76.3 (*)    MCH 24.3 (*)    RDW 18.3 (*)    All other components within normal limits  BRAIN NATRIURETIC PEPTIDE - Abnormal; Notable for the following components:   B Natriuretic Peptide 884.1 (*)    All other components within normal limits  TROPONIN I (HIGH SENSITIVITY) - Abnormal; Notable for the following components:   Troponin I (High Sensitivity) 23 (*)    All other components within normal limits  CULTURE, BLOOD (ROUTINE X 2)  CULTURE, BLOOD (ROUTINE X 2)  PROCALCITONIN  TROPONIN I (HIGH SENSITIVITY)     EKG  I, Nance Pear, attending physician, personally viewed and interpreted this EKG  EKG Time: 1129 Rate: 116 Rhythm: atrial fibrillation with RVR Axis: right axis deviation Intervals: qtc 480 QRS: narrow ST changes: no st elevation Impression: abnormal ekg   RADIOLOGY I independently interpreted and  visualized the CXR. My interpretation: airspace opacities Radiology interpretation:  IMPRESSION:  Mildly increased confluence of heterogeneous opacities superimposed  on a background of coarse bilateral reticular opacities. Findings  may reflect superimposed infection, aspiration, progressive fibrosis  or atelectasis on a background of pulmonary fibrosis. Consider  follow-up PA and lateral chest radiograph in 4 weeks to assess for  resolution.     PROCEDURES:  Critical Care performed: No  Procedures   MEDICATIONS ORDERED IN ED: Medications - No data to display   IMPRESSION / MDM / [place] / ED COURSE  I reviewed the triage vital signs and the nursing notes.                              Differential diagnosis includes, but is not limited to, chf, pneumonia, covid, flu, RSV  Patient's presentation is most consistent with acute presentation with potential threat to life or bodily function.  Patient presented to the emergency department today because  of concern for fluid overload. Is complaining of some shortness of breath. On exam patient has bilateral lower extremity edema. Cxr is concerning for pulmonary opacities. BNP is elevated. However patient also with elevated procalcitonin. Given concern for possible infection IV abx were initiated. Additionally patient was written for IV lasix. Discussed with Dr. Blaine Hamper with the hospitalist service who will plan admission.      FINAL CLINICAL IMPRESSION(S) / ED DIAGNOSES   Final diagnoses:  Edema, unspecified type  Hypervolemia, unspecified hypervolemia type  Pneumonia due to infectious organism, unspecified laterality, unspecified part of lung      Note:  This document was prepared using Dragon voice recognition software and may include unintentional dictation errors.    Nance Pear, MD 08/11/22 [phone]

## 2022-08-11 NOTE — H&P (Signed)
History and Physical    GIOVONI BUNCH UYQ:[id number] DOB: 1932-03-16 DOA: 08/11/2022  Referring MD/NP/PA:   PCP: [name], MD   Patient coming from:  The patient is coming from home.   Chief Complaint: SOB and weight gain  HPI: Jacob Silva is a 89 y.o. male with medical history significant of hypertension, hyperlipidemia, diabetes mellitus, COPD on 2-3 L oxygen, CAD, CABG, diastolic CHF, hypothyroidism, gout, pulmonary fibrosis, CKD-4, former smoker, OSA on CPAP, who presents with shortness breath and weight gain.  Patient states that he has shortness of breath for more than 3 days, which has been progressively worsening.  Patient has cough with little mucus production.  No fever or chills.  Patient states that he had some chest pressure this morning which has resolved.  Currently no active chest pain.  Patient has nausea and vomited once earlier.  He had 1 episode of loose stool bowel movement yesterday.  Currently no nausea, vomiting, diarrhea or abdominal pain.  No symptoms of UTI.  Patient states that he has worsening bilateral lower leg edema.  He has second about 10 pounds recently.  Data reviewed independently and ED Course: pt was found to have WBC 11.4, BNP 884, renal function close to baseline, troponin level 23, procalcitonin 2.87.  Patient is admitted to telemetry bed as inpatient.    Chest x-ray showed: Mildly increased confluence of heterogeneous opacities superimposed on a background of coarse bilateral reticular opacities. Findings may reflect superimposed infection, aspiration, progressive fibrosis or atelectasis on a background of pulmonary fibrosis. Consider follow-up PA and lateral chest radiograph in 4 weeks to assess for resolution.   EKG: I have personally reviewed.  Sinus rhythm, QTc 446, T wave inversion in V1-V2, right axis deviation.   Review of Systems:   General: no fevers, chills, has body weight gain, has fatigue HEENT: no blurry vision,  hearing changes or sore throat Respiratory: has dyspnea, coughing, no wheezing CV: no chest pain, no palpitations GI: no nausea, vomiting, abdominal pain, diarrhea, constipation GU: no dysuria, burning on urination, increased urinary frequency, hematuria  Ext: has leg edema Neuro: no unilateral weakness, numbness, or tingling, no vision change or hearing loss Skin: no rash, no skin tear. MSK: No muscle spasm, no deformity, no limitation of range of movement in spin Heme: No easy bruising.  Travel history: No recent long distant travel.   Allergy:  Allergies  Allergen Reactions   Jardiance [Empagliflozin]    Penicillin G Other (See Comments)   Penicillins Swelling   Tramadol     Other reaction(s): Dizziness    Past Medical History:  Diagnosis Date   CAD (coronary artery disease)    a. 2013 s/p CABG x 3 (LIMA->dLAD, VG->RPDA, VG->OM1); b. 12/2021 NSTEMI (peak HsTrop 641)-->Med rx in setting of CKD IV. EF 55-60% w/ rwma by echo.   Chronic heart failure with preserved ejection fraction (HFpEF) ([place])    a. 12/2021 Echo: EF 55-60%, no rwma, sev RV dysfxn w/ RVSP 69.46mmHg. Sev dil RA. Triv MR. Ao sclerosis.   CKD (chronic kidney disease), stage IV (HCC)    COPD (chronic obstructive pulmonary disease) ([place])    a. Home O2.   Diabetes ([employer])    Essential hypertension    Hyperlipidemia LDL goal <70    NSTEMI (non-ST elevated myocardial infarction) ([place]) 01/13/2022   OSA (obstructive sleep apnea)    Pulmonary fibrosis ([place])     Past Surgical History:  Procedure Laterality Date   BACK SURGERY  HERNIA REPAIR     KNEE SURGERY Right    TRIPLE BYPASS      Social History:  reports that he quit smoking about 49 years ago. His smoking use included cigarettes. He has never used smokeless tobacco. He reports that he does not drink alcohol and does not use drugs.  Family History:  Family History  Problem Relation Age of Onset   Diabetes Mother    Kidney disease Mother    Heart attack  Brother    Throat cancer Brother      Prior to Admission medications   Medication Sig Start Date End Date Taking? Authorizing Provider  acetaminophen (TYLENOL) 325 MG tablet Take 650 mg by mouth every 6 (six) hours as needed for mild pain.    [provider]  albuterol (VENTOLIN HFA) 108 (90 Base) MCG/ACT inhaler Inhale 1 puff into the lungs every 6 (six) hours as needed for wheezing or shortness of breath. 10/12/13   [provider]  allopurinol (ZYLOPRIM) 100 MG tablet Take 100 mg by mouth daily. 02/13/21   [provider]  aspirin EC 81 MG tablet Take 81 mg by mouth daily.    [provider]  atorvastatin (LIPITOR) 80 MG tablet Take 40 mg by mouth at bedtime.    [provider]  clopidogrel (PLAVIX) 75 MG tablet Take 75 mg by mouth daily.    [provider]  dapagliflozin propanediol (FARXIGA) 10 MG TABS tablet Take 10 mg by mouth daily. 03/14/21   [provider]  diclofenac Sodium (VOLTAREN) 1 % GEL Apply 2 g topically 4 (four) times daily as needed (pain). 02/05/21   [provider]  famotidine (PEPCID) 20 MG tablet Take 1 tablet (20 mg total) by mouth daily as needed for heartburn or indigestion. 02/19/22   Oswald Hillock, MD  formoterol (PERFOROMIST) 20 MCG/2ML nebulizer solution Take 20 mcg by nebulization 2 (two) times daily.    [provider]  gabapentin (NEURONTIN) 400 MG capsule Take 800 mg by mouth 2 (two) times daily.    [provider]  isosorbide mononitrate (IMDUR) 30 MG 24 hr tablet Take 30 mg by mouth daily.    [provider]  LANTUS SOLOSTAR 100 UNIT/ML Solostar Pen Inject 30 Units into the skin at bedtime. 10/07/15   [provider]  levothyroxine (SYNTHROID) 100 MCG tablet Take 100 mcg by mouth every morning. 12/11/20   [provider]  loperamide (IMODIUM) 2 MG capsule Take 2 mg by mouth daily as needed for diarrhea or loose stools. 03/13/20   [provider]  loratadine (CLARITIN) 10 MG tablet Take 10 mg by mouth daily as needed for allergies.    [provider]  Propylene Glycol (SYSTANE BALANCE) 0.6 % SOLN Place 1 drop into both eyes daily as needed (dry eyes).    [provider]  torsemide 40 MG TABS Take 40 mg by mouth daily. 02/20/22   Oswald Hillock, MD  umeclidinium-vilanterol (ANORO ELLIPTA) 62.5-25 MCG/INH AEPB Inhale 1 puff into the lungs daily.    [provider]    Physical Exam: Vitals:   08/11/22 1127 08/11/22 1129  BP: (!) 143/76   Pulse: 82   Resp: 20   Temp: 97.7 F (36.5 C)   TempSrc: Oral   SpO2: 95%   Weight:  96.6 kg  Height:  5\' 11"  (1.803 m)   General: Not in acute distress HEENT:       Eyes: PERRL, EOMI, no scleral  icterus.       ENT: No discharge from the ears and nose, no pharynx injection, no tonsillar enlargement.        Neck: positive JVD, no bruit, no mass felt. Heme: No neck lymph node enlargement. Cardiac: S1/S2, RRR, No murmurs, No gallops or rubs. Respiratory: Has crackles bilaterally.  No wheezing or rhonchi bilaterally GI: Soft, nondistended, nontender, no rebound pain, no organomegaly, BS present. GU: No hematuria Ext: 2+ pitting leg edema bilaterally. 1+DP/PT pulse bilaterally. Musculoskeletal: No joint deformities, No joint redness or warmth, no limitation of ROM in spin. Skin: No rashes.  Neuro: Alert, oriented X3, cranial nerves II-XII grossly intact, moves all extremities. Psych: Patient is not psychotic, no suicidal or hemocidal ideation.  Labs on Admission: I have personally reviewed following labs and imaging studies  CBC: Recent Labs  Lab 08/11/22 1133  WBC 11.4*  HGB 12.0*  HCT 37.6*  MCV 76.3*  PLT 854   Basic Metabolic Panel: Recent Labs  Lab 08/11/22 1133 08/11/22 1331  NA 138  --   K 3.8  --   CL 98  --   CO2 31  --   GLUCOSE 117*  --   BUN 34*  --   CREATININE 2.68*  --   CALCIUM 8.7*  --   MG  --  2.4   GFR: Estimated  Creatinine Clearance: 22.6 mL/min (A) (by C-G formula based on SCr of 2.68 mg/dL (H)). Liver Function Tests: No results for input(s): "AST", "ALT", "ALKPHOS", "BILITOT", "PROT", "ALBUMIN" in the last 168 hours. No results for input(s): "LIPASE", "AMYLASE" in the last 168 hours. No results for input(s): "AMMONIA" in the last 168 hours. Coagulation Profile: No results for input(s): "INR", "PROTIME" in the last 168 hours. Cardiac Enzymes: No results for input(s): "CKTOTAL", "CKMB", "CKMBINDEX", "TROPONINI" in the last 168 hours. BNP (last 3 results) No results for input(s): "PROBNP" in the last 8760 hours. HbA1C: No results for input(s): "HGBA1C" in the last 72 hours. CBG: Recent Labs  Lab 08/11/22 1627  GLUCAP 109*   Lipid Profile: No results for input(s): "CHOL", "HDL", "LDLCALC", "TRIG", "CHOLHDL", "LDLDIRECT" in the last 72 hours. Thyroid Function Tests: No results for input(s): "TSH", "T4TOTAL", "FREET4", "T3FREE", "THYROIDAB" in the last 72 hours. Anemia Panel: No results for input(s): "VITAMINB12", "FOLATE", "FERRITIN", "TIBC", "IRON", "RETICCTPCT" in the last 72 hours. Urine analysis:    Component Value Date/Time   COLORURINE STRAW (A) 02/14/2022 2157   APPEARANCEUR CLEAR 02/14/2022 2157   LABSPEC 1.005 02/14/2022 2157   PHURINE 6.0 02/14/2022 2157   GLUCOSEU 150 (A) 02/14/2022 2157   HGBUR NEGATIVE 02/14/2022 2157   BILIRUBINUR NEGATIVE 02/14/2022 2157   [place] NEGATIVE 02/14/2022 2157   PROTEINUR NEGATIVE 02/14/2022 2157   NITRITE NEGATIVE 02/14/2022 2157   LEUKOCYTESUR NEGATIVE 02/14/2022 2157   Sepsis Labs: @LABRCNTIP (procalcitonin:4,lacticidven:4) )No results found for this or any previous visit (from the past 240 hour(s)).   Radiological Exams on Admission: DG Chest 2 View  Result Date: 08/11/2022 CLINICAL DATA:  chest pain EXAM: CHEST - 2 VIEW COMPARISON:  June 11, 2022, February 14, 2022, January 13, 2022 FINDINGS: The cardiomediastinal silhouette is  unchanged in contour.Status post median sternotomy and CABG. No pleural effusion. No pneumothorax. Similar appearance of coarse bilateral reticular opacities. Similar appearance of RIGHT lateral pleural thickening. There are multiple more focal areas of confluent opacity which appear mildly increased in comparison to more remote priors from 2023, particularly in the LEFT lung base and RIGHT upper lung. Visualized abdomen is  unremarkable. Multilevel degenerative changes of the thoracic spine. IMPRESSION: Mildly increased confluence of heterogeneous opacities superimposed on a background of coarse bilateral reticular opacities. Findings may reflect superimposed infection, aspiration, progressive fibrosis or atelectasis on a background of pulmonary fibrosis. Consider follow-up PA and lateral chest radiograph in 4 weeks to assess for resolution. Electronically Signed   By: Valentino Saxon M.D.   On: 08/11/2022 12:05      Assessment/Plan Principal Problem:   Acute on chronic diastolic CHF (congestive heart failure) (HCC) Active Problems:   Biventricular heart failure (HCC)   COPD (chronic obstructive pulmonary disease) (HCC)   CAD (coronary artery disease)   Myocardial injury   HLD (hyperlipidemia)   CKD (chronic kidney disease), stage IV (HCC)   Hypothyroidism   Type II diabetes mellitus with renal manifestations (HCC)   Gout   Assessment and Plan:  Acute on chronic diastolic CHF and biventricular heart failure: 2D echo on 01/14/2022 showed EF of 55-60%.  Patient has 2+ leg edema, positive JVD, crackles on auscultation, elevated BNP 884, clinically consistent with CHF exacerbation.  -Will admit to tele bed as inpatient -Lasix 60 mg bid by IV -Daily weights -strict I/O's -Low salt diet -Fluid restriction -Obtain REDs Vest reading  COPD (chronic obstructive pulmonary disease) ([place]): Patient does not have wheezing or rhonchi on auscultation.  Does not seem to have CHF exacerbation.  Chest  x-ray showed questionable pneumonia.  Patient has WBC 11.4, procalcitonin 2.87, but no fever. -Start Z-Pak empirically -Blood culture, sputum culture -Follow-up urine antigen Legionella and strep  CAD (coronary artery disease) and Myocardial injury: Troponin level 23.  Patient had outpatient lab which showed LDL 38 and A1c 7.4 on 08/05/2022 -Trend troponin -Continue Plavix and Lipitor  HLD (hyperlipidemia) -Lipitor  CKD (chronic kidney disease), stage IV [facility]): Recent baseline creatinine 2.0-2.78.  His creatinine is at 2.68, BUN 34, GFR 22, close to baseline -Monitor renal function closely with BMP  Hypothyroidism -Synthroid  Type II diabetes mellitus with renal manifestations [facility]): Recent A1c 7.4, poorly controlled.  Patient is taking Farxiga, Lantus 35 units daily -Sliding scale insulin -Glargine insulin 20 units daily  Gout -Allopurinol      DVT ppx: SQ Lovenox  Code Status: DNR per pt and his wife  Family Communication: Yes, patient's wife  at bed side.   Disposition Plan:  Anticipate discharge back to previous environment  Consults called:  none  Admission status and Level of care: Telemetry Cardiac:   as inpt     Dispo: The patient is from: Home              Anticipated d/c is to: Home              Anticipated d/c date is: 2 days              Patient currently is not medically stable to d/c.    Severity of Illness:  The appropriate patient status for this patient is INPATIENT. Inpatient status is judged to be reasonable and necessary in order to provide the required intensity of service to ensure the patient's safety. The patient's presenting symptoms, physical exam findings, and initial radiographic and laboratory data in the context of their chronic comorbidities is felt to place them at high risk for further clinical deterioration. Furthermore, it is not anticipated that the patient will be medically stable for discharge from the hospital within 2 midnights  of admission.   * I certify that at the point of admission it is  my clinical judgment that the patient will require inpatient hospital care spanning beyond 2 midnights from the point of admission due to high intensity of service, high risk for further deterioration and high frequency of surveillance required.*       Date of Service 08/11/2022    Ivor Costa [facility]   If 7PM-7AM, please contact night-coverage [email or web address] 08/11/2022, 5:26 PM

## 2022-08-11 NOTE — ED Triage Notes (Signed)
Pt states coming in with some chest pain and shortness of breath. Pt states symptoms started a few days ago. Pt states he has been weighing himself every day, and has been gaining weight.  Pt uses oxygen at home at 2-3lpm.  Wife states pt has CHF

## 2022-08-11 NOTE — ED Notes (Signed)
Dr Niu at bedside 

## 2022-08-12 ENCOUNTER — Encounter: Payer: Self-pay | Admitting: Internal Medicine

## 2022-08-12 DIAGNOSIS — J189 Pneumonia, unspecified organism: Secondary | ICD-10-CM | POA: Diagnosis present

## 2022-08-12 DIAGNOSIS — I5033 Acute on chronic diastolic (congestive) heart failure: Secondary | ICD-10-CM | POA: Diagnosis not present

## 2022-08-12 DIAGNOSIS — E1122 Type 2 diabetes mellitus with diabetic chronic kidney disease: Secondary | ICD-10-CM

## 2022-08-12 DIAGNOSIS — I2510 Atherosclerotic heart disease of native coronary artery without angina pectoris: Secondary | ICD-10-CM | POA: Diagnosis not present

## 2022-08-12 DIAGNOSIS — M109 Gout, unspecified: Secondary | ICD-10-CM

## 2022-08-12 DIAGNOSIS — N184 Chronic kidney disease, stage 4 (severe): Secondary | ICD-10-CM

## 2022-08-12 DIAGNOSIS — J449 Chronic obstructive pulmonary disease, unspecified: Secondary | ICD-10-CM

## 2022-08-12 DIAGNOSIS — Z794 Long term (current) use of insulin: Secondary | ICD-10-CM

## 2022-08-12 LAB — CBG MONITORING, ED
Glucose-Capillary: 94 mg/dL (ref 70–99)
Glucose-Capillary: 113 mg/dL — ABNORMAL HIGH (ref 70–99)
Glucose-Capillary: 109 mg/dL — ABNORMAL HIGH (ref 70–99)
Glucose-Capillary: 155 mg/dL — ABNORMAL HIGH (ref 70–99)

## 2022-08-12 LAB — BASIC METABOLIC PANEL
Anion gap: UNDETERMINED (ref 5–15)
Anion gap: 12 (ref 5–15)
BUN: UNDETERMINED mg/dL (ref 8–23)
BUN: 32 mg/dL — ABNORMAL HIGH (ref 8–23)
CO2: UNDETERMINED mmol/L (ref 22–32)
CO2: 28 mmol/L (ref 22–32)
Calcium: UNDETERMINED mg/dL (ref 8.9–10.3)
Calcium: 8.7 mg/dL — ABNORMAL LOW (ref 8.9–10.3)
Chloride: UNDETERMINED mmol/L (ref 98–111)
Chloride: 99 mmol/L (ref 98–111)
Creatinine, Ser: UNDETERMINED mg/dL (ref 0.61–1.24)
Creatinine, Ser: 2.45 mg/dL — ABNORMAL HIGH (ref 0.61–1.24)
GFR, Estimated: UNDETERMINED mL/min (ref >60)
GFR, Estimated: 25 mL/min — ABNORMAL LOW (ref >60)
Glucose, Bld: UNDETERMINED mg/dL (ref 70–99)
Glucose, Bld: 131 mg/dL — ABNORMAL HIGH (ref 70–99)
Potassium: UNDETERMINED mmol/L (ref 3.5–5.1)
Potassium: 3.5 mmol/L (ref 3.5–5.1)
Sodium: UNDETERMINED mmol/L (ref 135–145)
Sodium: 139 mmol/L (ref 135–145)

## 2022-08-12 LAB — CBC
HCT: 34.9 % — ABNORMAL LOW (ref 39.0–52.0)
Hemoglobin: 11.1 g/dL — ABNORMAL LOW (ref 13.0–17.0)
MCH: 24.2 pg — ABNORMAL LOW (ref 26.0–34.0)
MCHC: 31.8 g/dL (ref 30.0–36.0)
MCV: 76.2 fL — ABNORMAL LOW (ref 80.0–100.0)
Platelets: 177 10*3/uL (ref 150–400)
RBC: 4.58 MIL/uL (ref 4.22–5.81)
RDW: 18.2 % — ABNORMAL HIGH (ref 11.5–15.5)
WBC: 10.4 10*3/uL (ref 4.0–10.5)
nRBC: 0 % (ref 0.0–0.2)

## 2022-08-12 LAB — STREP PNEUMONIAE URINARY ANTIGEN: Strep Pneumo Urinary Antigen: NEGATIVE

## 2022-08-12 MED ORDER — LEVOFLOXACIN 500 MG PO TABS
500.0000 mg | ORAL_TABLET | ORAL | Status: DC
  Administered 2022-08-12 – 2022-08-14 (×2): 500 mg via ORAL
  Filled 2022-08-12 (×2): qty 1

## 2022-08-12 NOTE — Assessment & Plan Note (Signed)
Marland Kitchen  Recent A1c 7.4, poorly controlled.  Patient is taking Wilder Glade, Lantus 35 units daily -Sliding scale insulin -Glargine insulin 20 units daily

## 2022-08-12 NOTE — Assessment & Plan Note (Signed)
-  Continue diabetic

## 2022-08-12 NOTE — Progress Notes (Signed)
Progress Note   Patient: Jacob Silva JIR:[id number] DOB: Dec 23, 1931 DOA: 08/11/2022     1 DOS: the patient was seen and examined on 08/12/2022   Brief hospital course: Taken from H&P.   EDMUNDO TEDESCO is a 89 y.o. male with medical history significant of hypertension, hyperlipidemia, diabetes mellitus, COPD on 2-3 L oxygen, CAD, CABG, diastolic CHF, hypothyroidism, gout, pulmonary fibrosis, CKD-4, former smoker, OSA on CPAP, who presents with progressively worsening shortness breath and weight gain for the past few days.  Patient has about 10 pound weight gain recently.  Patient also has some cough with little mucus production.  No fever or chills.  Data reviewed independently and ED Course: pt was found to have WBC 11.4, BNP 884, renal function close to baseline, troponin level 23>>23, procalcitonin 2.87.  Respiratory panel negative Chest x-ray showed: Mildly increased confluence of heterogeneous opacities superimposed on a background of coarse bilateral reticular opacities. Findings may reflect superimposed infection, aspiration, progressive fibrosis or atelectasis on a background of pulmonary fibrosis. Consider follow-up PA and lateral chest radiograph in 4 weeks to assess for resolution.    EKG: I have personally reviewed.  Sinus rhythm, QTc 446, T wave inversion in V1-V2, right axis deviation.  1/15: Vitals normal.  BMP with creatinine of 0.79, it was 2.68 yesterday which was at his baseline off for the past more than a year, concern of lab error.  Preliminary blood cultures negative.  Strep pneumo negative.  Repeat BMP Penicillin allergy is noted as swelling-apparently never taken any cephalosporins in the past, starting him on Levaquin for concern of CAP.   Assessment and Plan: * Acute on chronic diastolic CHF (congestive heart failure) ([place]) 2D echo on 01/14/2022 showed EF of 55-60%. Patient has 2+ leg edema, positive JVD, crackles on auscultation, elevated BNP 884, clinically  consistent with CHF exacerbation.  Patient was started on IV diuresis. -Continue with IV Lasix 60 mg twice daily -Daily weight and BMP -Strict intake and output  Biventricular heart failure ([place]) Patient had reduced right ventricular function and dilatation during prior echo. History of pulmonary hypertension. -See above for management  CAP (community acquired pneumonia) Patient with worsening cough for 1 week, leukocytosis and elevated procalcitonin on admission.  Imaging concerning for superadded infection versus progression of prior pulmonary fibrosis, recommending a repeat imaging in 3 to 4 weeks after antibiotic. He was started on Zithromax.  Penicillin allergies with swelling was noted and he never received any cephalosporin in the past per pharmacy. Preliminary blood cultures negative, strep pneumo negative, Legionella pending -Switching him to Levaquin -Monitor procalcitonin  COPD (chronic obstructive pulmonary disease) ([place]) Patient with no concern of exacerbation as there is no wheezing or increased oxygen requirement from baseline. -Continue with bronchodilators  CAD (coronary artery disease) No chest pain, barely positive troponin with a flat curve.  Most likely secondary to demand ischemia. Patient had outpatient lab which showed LDL 38 and A1c 7.4 on 08/05/2022  -Continue with Plavix and Lipitor  HLD (hyperlipidemia) -Continue diabetic  CKD (chronic kidney disease), stage IV (HCC) Creatinine around baseline of 2-2.78, similar baseline for more than a year. A.m. BMP with unusually normal creatinine which is most likely a lab error, repeat BMP. -Monitor renal function -Avoid nephro toxins  Hypothyroidism -Continue home Synthroid  Type II diabetes mellitus with renal manifestations ([place]) : Recent A1c 7.4, poorly controlled.  Patient is taking Wilder Glade, Lantus 35 units daily -Sliding scale insulin -Glargine insulin 20 units daily  Gout -Continue  allopurinol  Subjective: Patient was seen and examined while working with PT.  Patient was having worsening cough mostly dry with very small amount of mucus production for about 1 week.  No urinary symptoms.  No fever or chills  Physical Exam: Vitals:   08/12/22 0400 08/12/22 0411 08/12/22 1100 08/12/22 1101  BP: 107/76  109/67   Pulse: 78  74   Resp: 20  (!) 23   Temp:  97.8 F (36.6 C)  97.7 F (36.5 C)  TempSrc:  Oral    SpO2: 95%  92%   Weight:      Height:       General.  Frail elderly man, in no acute distress. Pulmonary.  Basal crackles with some scattered rhonchi bilaterally, normal respiratory effort. CV.  Regular rate and rhythm, no JVD, rub or murmur. Abdomen.  Soft, nontender, nondistended, BS positive. CNS.  Alert and oriented .  No focal neurologic deficit. Extremities.  2+ LE edema, no cyanosis, pulses intact and symmetrical. Psychiatry.  Appears to have some cognitive impairment.  Data Reviewed: Prior data reviewed  Family Communication: Talked with wife on phone.  Disposition: Status is: Inpatient Remains inpatient appropriate because: Severity of illness  Planned Discharge Destination: Home with Home Health  DVT prophylaxis.  Lovenox Time spent: 50 minutes  This record has been created using Systems analyst. Errors have been sought and corrected,but may not always be located. Such creation errors do not reflect on the standard of care.   Author: Lorella Nimrod, MD 08/12/2022 11:29 AM  For on call review [email or web address].

## 2022-08-12 NOTE — Assessment & Plan Note (Signed)
No chest pain, barely positive troponin with a flat curve.  Most likely secondary to demand ischemia. Patient had outpatient lab which showed LDL 38 and A1c 7.4 on 08/05/2022  -Continue with Plavix and Lipitor

## 2022-08-12 NOTE — Assessment & Plan Note (Signed)
2D echo on 01/14/2022 showed EF of 55-60%. Patient has 2+ leg edema, positive JVD, crackles on auscultation, elevated BNP 884, clinically consistent with CHF exacerbation.  Patient was started on IV diuresis. -Continue with IV Lasix 60 mg twice daily -Daily weight and BMP -Strict intake and output

## 2022-08-12 NOTE — Discharge Instructions (Signed)

## 2022-08-12 NOTE — Assessment & Plan Note (Signed)
Creatinine around baseline of 2-2.78, similar baseline for more than a year. A.m. BMP with unusually normal creatinine which is most likely a lab error, repeat BMP. -Monitor renal function -Avoid nephro toxins

## 2022-08-12 NOTE — Assessment & Plan Note (Signed)
-  Continue allopurinol 

## 2022-08-12 NOTE — TOC Initial Note (Signed)
Transition of Care [facility]) - Initial/Assessment Note    Patient Details  Name: Jacob Silva MRN: [id number] Date of Birth: 1931/10/08  [facility]) CM/SW Contact:    Jacob Hutching, RN Phone Number: 08/12/2022, 2:34 PM  Clinical Narrative:                 Patient admitted to the hospital with Acute on chronic diastolic CHF.  Patient is from home with his wife and walks with a walker.  Chronic oxygen with [employer].  Patient is currently open with [facility] for PT.  Jacob Silva is aware of admission.  RNCM asked MD to order RN [facility] at discharge as well as PT.    Expected Discharge Plan: [place] Barriers to Discharge: Continued Medical Work up   Patient Goals and CMS Choice   CMS Medicare.gov Compare Post Acute Care list provided to:: Patient Choice offered to / list presented to : Patient      Expected Discharge Plan and Services   Discharge Planning Services: CM Consult Post Acute Care Choice: Home Health, Resumption of Svcs/PTA Provider Living arrangements for the past 2 months: Single Family Home                 DME Arranged: N/A DME Agency: NA       HH Arranged: RN, PT [place] Agency: [facility] ([place]) Date HH Agency Contacted: 08/12/22 Time [place]: 1433 Representative spoke with at [place]: [place] Arrangements/Services Living arrangements for the past 2 months: Jacob Silva with:: Spouse   Do you feel safe going back to the place where you live?: Yes      Need for Family Participation in Patient Care: Yes (Comment) Care giver support system in place?: Yes (comment) Current home services: DME (walker, wheelchair, rollator, oxygen, powerchair) Criminal Activity/Legal Involvement Pertinent to Current Situation/Hospitalization: No - Comment as needed  Activities of Daily Living Home Assistive Devices/Equipment: Eyeglasses, Dentures (specify type), Hearing aid, Walker (specify type) ADL  Screening (condition at time of admission) Patient's cognitive ability adequate to safely complete daily activities?: Yes Is the patient deaf or have difficulty hearing?: Yes Does the patient have difficulty seeing, even when wearing glasses/contacts?: No Does the patient have difficulty concentrating, remembering, or making decisions?: No Patient able to express need for assistance with ADLs?: Yes Does the patient have difficulty dressing or bathing?: No Independently performs ADLs?: No Communication: Independent Dressing (OT): Independent Grooming: Independent Feeding: Independent Bathing: Needs assistance Is this a change from baseline?: Change from baseline, expected to last <3 days Toileting: Needs assistance Is this a change from baseline?: Change from baseline, expected to last <3 days In/Out Bed: Needs assistance Is this a change from baseline?: Change from baseline, expected to last <3 days Walks in Home: Needs assistance Is this a change from baseline?: Change from baseline, expected to last <3 days Does the patient have difficulty walking or climbing stairs?: Yes Weakness of Legs: Both Weakness of Arms/Hands: None  Permission Sought/Granted      Share Information with NAME: Aravind Chrismer  Permission granted to share info w AGENCY: [place] granted to share info w Relationship: spouse  Permission granted to share info w Contact Information: [phone]  Emotional Assessment         Alcohol / Substance Use: Not Applicable Psych Involvement: No (comment)  Admission diagnosis:  Acute on chronic diastolic CHF (congestive heart failure) ([place]) [I50.33] Patient Active Problem List  Diagnosis Date Noted   CAP (community acquired pneumonia) 08/12/2022   Acute on chronic diastolic CHF (congestive heart failure) ([place]) 08/11/2022   Type II diabetes mellitus with renal manifestations ([place]) 08/11/2022   CKD (chronic kidney disease), stage IV ([place]) 08/11/2022    HLD (hyperlipidemia) 08/11/2022   COPD exacerbation ([place]) 08/11/2022   Myocardial injury 08/11/2022   COPD (chronic obstructive pulmonary disease) ([place]) 08/11/2022   Biventricular heart failure ([place]) 02/14/2022   Congestive heart failure with right ventricular systolic dysfunction ([place]) 02/14/2022   DNR (do not resuscitate) 02/14/2022   Chronic obstructive pulmonary disease, unspecified ([place]) 01/13/2022   Acute on chronic respiratory failure with hypoxia (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[place]) 11/14/2013   HTN (hypertension) 11/14/2013   Hypercholesterolemia 11/14/2013   Hypothyroidism 11/14/2013   Arthritis, degenerative 11/14/2013   Iron deficiency anemia due to chronic blood loss 11/11/2013   Heart trouble 12/21/2010   Arthritis 12/21/2010   PCP:  [name], MD Pharmacy:   [facility] [place], [place] - [place] AT [facility] [address] [place] [address] Phone: [phone] Fax: [phone]     Social Determinants of Health (SDOH) Social History: SDOH Screenings   Food Insecurity: No Food Insecurity (08/12/2022)  Housing: Low Risk  (08/12/2022)  Transportation Needs: No Transportation Needs (08/12/2022)  Utilities: Not At Risk (08/12/2022)  Alcohol Screen: Low Risk  (02/19/2022)  Financial Resource Strain: Low Risk  (02/19/2022)  Tobacco Use: Medium Risk (08/12/2022)   SDOH Interventions:     Readmission Risk  Interventions     No data to display

## 2022-08-12 NOTE — Assessment & Plan Note (Addendum)
Patient with worsening cough for 1 week, leukocytosis and elevated procalcitonin on admission.  Imaging concerning for superadded infection versus progression of prior pulmonary fibrosis, recommending a repeat imaging in 3 to 4 weeks after antibiotic. He was started on Zithromax.  Penicillin allergies with swelling was noted and he never received any cephalosporin in the past per pharmacy. Preliminary blood cultures negative, strep pneumo negative, Legionella pending -Switching him to Levaquin -Monitor procalcitonin

## 2022-08-12 NOTE — Evaluation (Signed)
Occupational Therapy Evaluation Patient Details Name: Jacob Silva MRN: [id number] DOB: 1932-01-13 Today's Date: 08/12/2022   History of Present Illness Jacob Silva is a 89 y.o. male with medical history significant of hypertension, hyperlipidemia, diabetes mellitus, COPD on 2-3 L oxygen, CAD, CABG, diastolic CHF, hypothyroidism, gout, pulmonary fibrosis, CKD-4, former smoker, OSA on CPAP, who presents with shortness breath and weight gain.   Clinical Impression   Patient presenting with decreased Ind in self care,balance, functional mobility/transfers, endurance, and safety awareness. Patient reports living at home with wife. He is on 2-3 Ls O2 via [place] at baseline and on 3Ls during this assessment. Pt reports needing min A for LB self care and use of RW for mobility of short distance. He does use wheelchair outside of home to decrease fall risk.  Patient currently functioning at supervision level with use of RW. Patient will benefit from acute OT to increase overall independence in the areas of ADLs, functional mobility, and safety awareness in order to safely discharge home with wife.     Recommendations for follow up therapy are one component of a multi-disciplinary discharge planning process, led by the attending physician.  Recommendations may be updated based on patient status, additional functional criteria and insurance authorization.   Follow Up Recommendations  Home health OT     Assistance Recommended at Discharge Intermittent Supervision/Assistance  Patient can return home with the following A little help with walking and/or transfers;A little help with bathing/dressing/bathroom;Help with stairs or ramp for entrance;Assist for transportation;Assistance with cooking/housework;Direct supervision/assist for financial management;Direct supervision/assist for medications management    Functional Status Assessment  Patient has had a recent decline in their functional status and  demonstrates the ability to make significant improvements in function in a reasonable and predictable amount of time.  Equipment Recommendations  None recommended by OT       Precautions / Restrictions Precautions Precautions: Fall Restrictions Weight Bearing Restrictions: No      Mobility Bed Mobility Overal bed mobility: Modified Independent                  Transfers Overall transfer level: Needs assistance Equipment used: Rolling walker (2 wheels) Transfers: Sit to/from Stand Sit to Stand: Supervision                  Balance Overall balance assessment: Needs assistance Sitting-balance support: No upper extremity supported, Feet supported Sitting balance-Leahy Scale: Good     Standing balance support: No upper extremity supported, During functional activity Standing balance-Leahy Scale: Fair Standing balance comment: maintains standing at bedside without UE support                           ADL either performed or assessed with clinical judgement   ADL Overall ADL's : Needs assistance/impaired     Grooming: Wash/dry hands;Wash/dry face;Sitting;Supervision/safety                   Armed forces technical officer: Supervision/safety;Rolling walker (2 wheels) Armed forces technical officer Details (indicate cue type and reason): simulated                 Vision Patient Visual Report: No change from baseline              Pertinent Vitals/Pain Pain Assessment Pain Assessment: Faces Faces Pain Scale: No hurt     Hand Dominance Right   Extremity/Trunk Assessment Upper Extremity Assessment Upper Extremity Assessment: Generalized weakness   Lower Extremity  Assessment Lower Extremity Assessment: Generalized weakness       Communication Communication Communication: HOH   Cognition Arousal/Alertness: Awake/alert Behavior During Therapy: WFL for tasks assessed/performed Overall Cognitive Status: Within Functional Limits for tasks assessed                                  General Comments: HOH but pleasant and cooperative     General Comments  SPO2 desat to 82% post ambuation on 2L/min. Required titration to 3 L/min and seated rest 2-3 minutes to return to > 90%. Intermittent, poor pleth waveform from gripping RW. Hard to assess true desaturation during gait until seated after bout.            Home Living Family/patient expects to be discharged to:: Private residence Living Arrangements: Spouse/significant other Available Help at Discharge: Family;Available 24 hours/day Type of Home: House Home Access: Ramped entrance     Home Layout: One level     Bathroom Shower/Tub: Occupational psychologist: Handicapped height Bathroom Accessibility: Yes   Home Equipment: Shower seat - built in;Grab bars - tub/shower;Grab bars - toilet;Electric scooter;Wheelchair - manual;Rollator (4 wheels);BSC/3in1;Cane - single point;Other (comment);Rolling Walker (2 wheels)   Additional Comments: 2-3 L/min O2 at baseline      Prior Functioning/Environment Prior Level of Function : Needs assist       Physical Assist : ADLs (physical);Mobility (physical)     Mobility Comments: mod-I with RW or 4WW in household. Sometimes uses electric scooter and w/c when outside of the home. ADLs Comments: Intermittently requires assistance for shoes/socks from wife        OT Problem List: Decreased strength;Decreased activity tolerance;Impaired balance (sitting and/or standing);Cardiopulmonary status limiting activity;Decreased safety awareness;Decreased knowledge of use of DME or AE      OT Treatment/Interventions: Self-care/ADL training;Therapeutic exercise;Therapeutic activities;Balance training;Patient/family education;DME and/or AE instruction;Energy conservation    OT Goals(Current goals can be found in the care plan section) Acute Rehab OT Goals Patient Stated Goal: to feel better and go home OT Goal Formulation: With  patient Time For Goal Achievement: 08/26/22 Potential to Achieve Goals: Good ADL Goals Pt Will Perform Grooming: with modified independence;standing Pt Will Transfer to Toilet: with modified independence;ambulating Pt Will Perform Toileting - Clothing Manipulation and hygiene: with modified independence;sit to/from stand  OT Frequency: Min 2X/week       AM-PAC OT "6 Clicks" Daily Activity     Outcome Measure Help from another person eating meals?: None Help from another person taking care of personal grooming?: None Help from another person toileting, which includes using toliet, bedpan, or urinal?: A Little Help from another person bathing (including washing, rinsing, drying)?: A Little Help from another person to put on and taking off regular upper body clothing?: None Help from another person to put on and taking off regular lower body clothing?: A Little 6 Click Score: 21   End of Session Equipment Utilized During Treatment: Oxygen Nurse Communication: Mobility status  Activity Tolerance: Patient tolerated treatment well Patient left: in chair;with call bell/phone within reach  OT Visit Diagnosis: Unsteadiness on feet (R26.81);Repeated falls (R29.6);Muscle weakness (generalized) (M62.81)                Time: 0349-1791 OT Time Calculation (min): 13 min Charges:  OT General Charges $OT Visit: 1 Visit OT Evaluation $OT Eval Low Complexity: 1 Low OT Treatments $Therapeutic Activity: 8-22 mins  Darleen Crocker, MS, OTR/L ,  CBIS ascom [phone]  08/12/22, 11:47 AM

## 2022-08-12 NOTE — ED Notes (Signed)
PT at bedside to ambulate patient at this time.

## 2022-08-12 NOTE — Assessment & Plan Note (Signed)
Patient had reduced right ventricular function and dilatation during prior echo. History of pulmonary hypertension. -See above for management

## 2022-08-12 NOTE — Hospital Course (Addendum)
Taken from H&P.   Jacob Silva is a 89 y.o. male with medical history significant of hypertension, hyperlipidemia, diabetes mellitus, COPD on 2-3 L oxygen, CAD, CABG, diastolic CHF, hypothyroidism, gout, pulmonary fibrosis, CKD-4, former smoker, OSA on CPAP, who presents with progressively worsening shortness breath and weight gain for the past few days.  Patient has about 10 pound weight gain recently.  Patient also has some cough with little mucus production.  No fever or chills.  Data reviewed independently and ED Course: pt was found to have WBC 11.4, BNP 884, renal function close to baseline, troponin level 23>>23, procalcitonin 2.87.  Respiratory panel negative Chest x-ray showed: Mildly increased confluence of heterogeneous opacities superimposed on a background of coarse bilateral reticular opacities. Findings may reflect superimposed infection, aspiration, progressive fibrosis or atelectasis on a background of pulmonary fibrosis. Consider follow-up PA and lateral chest radiograph in 4 weeks to assess for resolution.    EKG: I have personally reviewed.  Sinus rhythm, QTc 446, T wave inversion in V1-V2, right axis deviation.  1/15: Vitals normal.  BMP with creatinine of 0.79, it was 2.68 yesterday which was at his baseline off for the past more than a year, concern of lab error.  Preliminary blood cultures negative.  Strep pneumo negative.  Repeat BMP Penicillin allergy is noted as swelling-apparently never taken any cephalosporins in the past, starting him on Levaquin for concern of CAP.  1/16: Vitals stable.  Procalcitonin improving, currently at 1.83.  Renal function stable.  No urinary output or weight recorded.  Oxygen requirement stable at baseline.  PT is recommending home health.  Would like to do IV diuresis for 1-2 more days, while renal function remained stable.

## 2022-08-12 NOTE — Assessment & Plan Note (Signed)
-  Continue home Synthroid °

## 2022-08-12 NOTE — Assessment & Plan Note (Signed)
Patient with no concern of exacerbation as there is no wheezing or increased oxygen requirement from baseline. -Continue with bronchodilators

## 2022-08-12 NOTE — Evaluation (Signed)
Physical Therapy Evaluation Patient Details Name: Jacob Silva MRN: [id number] DOB: [birth year] Today's Date: 08/12/2022  History of Present Illness  Jacob Silva is a 89 y.o. male with medical history significant of hypertension, hyperlipidemia, diabetes mellitus, COPD on 2-3 L oxygen, CAD, CABG, diastolic CHF, hypothyroidism, gout, pulmonary fibrosis, CKD-4, former smoker, OSA on CPAP, who presents with shortness breath and weight gain.   Clinical Impression  Pt admitted with above diagnosis. Pt received upright in bed. Agreeable to PT eval with spouse present. Spouse assisting in subjective reports as pt is very HoH without his hearing aids. Per spouse pt is mod-I with RW and 4WW in household for mobility. Has had multiple falls since last admission due to reports of UE/LE sensory changes that spouse states MD's think is from DDD in cervical and lumbar regions. B/c of this they have relied more on his manual w/c for some mobility particularly outside of the home.   To date, pt resting on 2 L/min SPO2 via [place]. Able to exit bed mod-I and stand at bedside with supervision. Pt tolerating ambulation bouts totaling of 115' with RW with minimal step through gait but at supervision level. Overall safe use of RW besides minimal VC's for maintaining RW on the floor with turning requiring minguard due to R/L sway with turns when RW is off the floor.  Pt returns supine in bed with spo2 desat to 82% requiring titration to 3L/min and PLB and supine rest to recover > 90% after 2-3 minutes. Pt returned to 2L/min with MD at bedside. Educated pt on added stability benefits of RW due to recurrent falls and 4WW better for energy conservation. Pt left with all needs in reach. Pt currently with functional limitations due to the deficits listed below (see PT Problem List). Pt will benefit from skilled PT to increase their independence and safety with mobility to allow discharge to the venue listed below.       Recommendations for follow up therapy are one component of a multi-disciplinary discharge planning process, led by the attending physician.  Recommendations may be updated based on patient status, additional functional criteria and insurance authorization.  Follow Up Recommendations Home health PT      Assistance Recommended at Discharge Frequent or constant Supervision/Assistance  Patient can return home with the following  A little help with walking and/or transfers;Assistance with cooking/housework;Assist for transportation;A little help with bathing/dressing/bathroom;Help with stairs or ramp for entrance    Equipment Recommendations None recommended by PT  Recommendations for Other Services       Functional Status Assessment Patient has had a recent decline in their functional status and demonstrates the ability to make significant improvements in function in a reasonable and predictable amount of time.     Precautions / Restrictions Precautions Precautions: Fall Restrictions Weight Bearing Restrictions: No      Mobility  Bed Mobility Overal bed mobility: Modified Independent             General bed mobility comments: bed elevated Patient Response: Cooperative  Transfers Overall transfer level: Needs assistance Equipment used: Rolling walker (2 wheels) Transfers: Sit to/from Stand Sit to Stand: Supervision                Ambulation/Gait Ambulation/Gait assistance: Supervision Gait Distance (Feet): 115 Feet Assistive device: Rolling walker (2 wheels) Gait Pattern/deviations: Step-through pattern, Decreased step length - right, Decreased step length - left       General Gait Details: stable throughout gait with minimal step  through pattern. min VC's to prevent lifting RW off floor with turns but otherwise safe and correct use.  Stairs            Wheelchair Mobility    Modified Rankin (Stroke Patients Only)       Balance Overall balance  assessment: Needs assistance Sitting-balance support: No upper extremity supported, Feet supported Sitting balance-Leahy Scale: Good     Standing balance support: No upper extremity supported, During functional activity Standing balance-Leahy Scale: Fair Standing balance comment: maintains standing at bedside without UE support                             Pertinent Vitals/Pain Pain Assessment Pain Assessment: Faces Faces Pain Scale: No hurt    Home Living Family/patient expects to be discharged to:: Private residence Living Arrangements: Spouse/significant other Available Help at Discharge: Family;Available 24 hours/day Type of Home: House Home Access: Ramped entrance       Home Layout: One level Home Equipment: Shower seat - built in;Grab bars - tub/shower;Grab bars - toilet;Electric scooter;Wheelchair - manual;Rollator (4 wheels);BSC/3in1;Cane - single point;Other (comment);Rolling Walker (2 wheels) Additional Comments: 2-3 L/min O2 at baseline    Prior Function Prior Level of Function : Needs assist       Physical Assist : ADLs (physical);Mobility (physical)     Mobility Comments: mod-I with RW or 4WW in household. Sometimes uses electric scooter and w/c when outside of the home.       Hand Dominance   Dominant Hand: Right    Extremity/Trunk Assessment   Upper Extremity Assessment Upper Extremity Assessment: Defer to OT evaluation    Lower Extremity Assessment Lower Extremity Assessment: Generalized weakness       Communication   Communication: HOH  Cognition Arousal/Alertness: Awake/alert Behavior During Therapy: WFL for tasks assessed/performed Overall Cognitive Status: Within Functional Limits for tasks assessed                                 General Comments: HOH        General Comments General comments (skin integrity, edema, etc.): SPO2 desat to 82% post ambuation on 2L/min. Required titration to 3 L/min and seated  rest 2-3 minutes to return to > 90%. Intermittent, poor pleth waveform from gripping RW. Hard to assess true desaturation during gait until seated after bout.    Exercises Other Exercises Other Exercises: Role of PT in acute setting, d/c recs, RW versus 4WW for energy conservation versus stability.   Assessment/Plan    PT Assessment Patient needs continued PT services  PT Problem List Decreased strength;Decreased activity tolerance;Decreased balance;Decreased mobility;Cardiopulmonary status limiting activity       PT Treatment Interventions DME instruction;Balance training;Gait training;Neuromuscular re-education;Functional mobility training;Patient/family education;Therapeutic activities;Therapeutic exercise    PT Goals (Current goals can be found in the Care Plan section)  Acute Rehab PT Goals Patient Stated Goal: return home PT Goal Formulation: With patient/family Time For Goal Achievement: 08/26/22 Potential to Achieve Goals: Good    Frequency Min 2X/week     Co-evaluation               AM-PAC PT "6 Clicks" Mobility  Outcome Measure Help needed turning from your back to your side while in a flat bed without using bedrails?: A Little Help needed moving from lying on your back to sitting on the side of a flat bed without using  bedrails?: A Little Help needed moving to and from a bed to a chair (including a wheelchair)?: A Little Help needed standing up from a chair using your arms (e.g., wheelchair or bedside chair)?: A Little Help needed to walk in hospital room?: A Little Help needed climbing 3-5 steps with a railing? : A Lot 6 Click Score: 17    End of Session Equipment Utilized During Treatment: Gait belt;Oxygen Activity Tolerance: Patient tolerated treatment well Patient left: in bed;with call bell/phone within reach;with bed alarm set;Other (comment) (MD at bedside.) Nurse Communication: Mobility status PT Visit Diagnosis: Other abnormalities of gait and  mobility (R26.89);Muscle weakness (generalized) (M62.81);History of falling (Z91.81)    Time: 5597-4163 PT Time Calculation (min) (ACUTE ONLY): 23 min   Charges:   PT Evaluation $PT Eval Low Complexity: 1 Low PT Treatments $Gait Training: 8-22 mins        Salem Caster. Fairly IV, PT, DPT Physical Therapist- [facility]  08/12/2022, 9:53 AM

## 2022-08-13 DIAGNOSIS — N184 Chronic kidney disease, stage 4 (severe): Secondary | ICD-10-CM | POA: Diagnosis not present

## 2022-08-13 DIAGNOSIS — I5033 Acute on chronic diastolic (congestive) heart failure: Secondary | ICD-10-CM | POA: Diagnosis not present

## 2022-08-13 DIAGNOSIS — J449 Chronic obstructive pulmonary disease, unspecified: Secondary | ICD-10-CM | POA: Diagnosis not present

## 2022-08-13 DIAGNOSIS — J189 Pneumonia, unspecified organism: Secondary | ICD-10-CM | POA: Diagnosis not present

## 2022-08-13 LAB — BASIC METABOLIC PANEL
Anion gap: 10 (ref 5–15)
BUN: 33 mg/dL — ABNORMAL HIGH (ref 8–23)
CO2: 30 mmol/L (ref 22–32)
Calcium: 8.6 mg/dL — ABNORMAL LOW (ref 8.9–10.3)
Chloride: 101 mmol/L (ref 98–111)
Creatinine, Ser: 2.44 mg/dL — ABNORMAL HIGH (ref 0.61–1.24)
GFR, Estimated: 25 mL/min — ABNORMAL LOW (ref >60)
Glucose, Bld: 84 mg/dL (ref 70–99)
Potassium: 3.7 mmol/L (ref 3.5–5.1)
Sodium: 141 mmol/L (ref 135–145)

## 2022-08-13 LAB — CBG MONITORING, ED
Glucose-Capillary: 94 mg/dL (ref 70–99)
Glucose-Capillary: 106 mg/dL — ABNORMAL HIGH (ref 70–99)

## 2022-08-13 LAB — GLUCOSE, CAPILLARY
Glucose-Capillary: 115 mg/dL — ABNORMAL HIGH (ref 70–99)
Glucose-Capillary: 86 mg/dL (ref 70–99)

## 2022-08-13 LAB — PROCALCITONIN: Procalcitonin: 1.83 ng/mL

## 2022-08-13 LAB — LEGIONELLA PNEUMOPHILA SEROGP 1 UR AG: L. pneumophila Serogp 1 Ur Ag: NEGATIVE

## 2022-08-13 MED ORDER — ACETAMINOPHEN 325 MG PO TABS
650.0000 mg | ORAL_TABLET | ORAL | Status: DC | PRN
  Administered 2022-08-14 – 2022-08-15 (×4): 650 mg via ORAL
  Filled 2022-08-13 (×4): qty 2

## 2022-08-13 MED ORDER — ORAL CARE MOUTH RINSE: 15.0000 mL | OROMUCOSAL | Status: DC | PRN

## 2022-08-13 MED ORDER — FUROSEMIDE 10 MG/ML IJ SOLN
20.0000 mg | Freq: Once | INTRAMUSCULAR | Status: AC
  Administered 2022-08-13: 20 mg via INTRAVENOUS

## 2022-08-13 NOTE — Progress Notes (Signed)
Mobility Specialist - Progress Note     08/13/22 1035  Mobility  Activity Ambulated with assistance in room;Stood at bedside  Level of Assistance Standby assist, set-up cues, supervision of patient - no hands on  Assistive Device Front wheel walker  Distance Ambulated (ft) 4 ft  Range of Motion/Exercises Right leg;Left leg  Activity Response Tolerated well  Mobility Referral Yes  $Mobility charge 1 Mobility   Pt resting on 4L upon entry in bed. Pt performed bed mobility modI and STS bedside x3 SBA to RW. Pt unable to participate in further mobility due to constant unination. Pt performed LE exercises and sided stepped to [facility] before returning to supine position. Pt left with needs in reach and bed alarm activated.   Loma Sender Mobility Specialist 08/13/22, 10:40 AM

## 2022-08-13 NOTE — ED Notes (Signed)
Purewick placed on patient per request.

## 2022-08-13 NOTE — Progress Notes (Signed)
Physical Therapy Treatment Patient Details Name: Jacob Silva MRN: [id number] DOB: Mar 18, 1932 Today's Date: 08/13/2022   History of Present Illness Jacob Silva is a 89 y.o. male with medical history significant of hypertension, hyperlipidemia, diabetes mellitus, COPD on 2-3 L oxygen, CAD, CABG, diastolic CHF, hypothyroidism, gout, pulmonary fibrosis, CKD-4, former smoker, OSA on CPAP, who presents with shortness breath and weight gain.    PT Comments    Pt received upright in bed. RN at bedside performing assessment as pt admitted to unit from ED. Pt remains on 3L/min maintaining > 90% throughout session. Pt able to exit bed mod-I and stand at supervision and ambulate ~160' supervision with safe use of RW and consistent step through gait without need for VC's with turns. Pt returning to supine in bed with all needs in reach. RN updated on pt's functional capacity and SPO2 during ambulation. Pt with all needs in reach, continuing to rec home with [facility] PT for safe transition into home setting.    Recommendations for follow up therapy are one component of a multi-disciplinary discharge planning process, led by the attending physician.  Recommendations may be updated based on patient status, additional functional criteria and insurance authorization.  Follow Up Recommendations  Home health PT     Assistance Recommended at Discharge Frequent or constant Supervision/Assistance  Patient can return home with the following A little help with walking and/or transfers;Assistance with cooking/housework;Assist for transportation;A little help with bathing/dressing/bathroom;Help with stairs or ramp for entrance   Equipment Recommendations  None recommended by PT    Recommendations for Other Services       Precautions / Restrictions Precautions Precautions: Fall Restrictions Weight Bearing Restrictions: No     Mobility  Bed Mobility Overal bed mobility: Modified Independent                Patient Response: Cooperative  Transfers Overall transfer level: Needs assistance Equipment used: Rolling walker (2 wheels) Transfers: Sit to/from Stand Sit to Stand: Supervision                Ambulation/Gait Ambulation/Gait assistance: Supervision Gait Distance (Feet): 160 Feet Assistive device: Rolling walker (2 wheels) Gait Pattern/deviations: Step-through pattern, Decreased step length - right, Decreased step length - left       General Gait Details: no need for VC's with RW use this date. Steady, consistent step through gait   Stairs             Wheelchair Mobility    Modified Rankin (Stroke Patients Only)       Balance Overall balance assessment: Needs assistance Sitting-balance support: No upper extremity supported, Feet supported Sitting balance-Leahy Scale: Good     Standing balance support: No upper extremity supported, During functional activity Standing balance-Leahy Scale: Fair Standing balance comment: maintains standing at bedside without UE support                            Cognition Arousal/Alertness: Awake/alert Behavior During Therapy: WFL for tasks assessed/performed Overall Cognitive Status: Within Functional Limits for tasks assessed                                          Exercises      General Comments General comments (skin integrity, edema, etc.): maintained > 90% on 3L/min post ambulation      Pertinent Vitals/Pain  Pain Assessment Pain Assessment: No/denies pain    Home Living                          Prior Function            PT Goals (current goals can now be found in the care plan section) Acute Rehab PT Goals Patient Stated Goal: return home PT Goal Formulation: With patient/family Time For Goal Achievement: 08/26/22 Potential to Achieve Goals: Good Progress towards PT goals: Progressing toward goals    Frequency    Min 2X/week      PT Plan Current  plan remains appropriate    Co-evaluation              AM-PAC PT "6 Clicks" Mobility   Outcome Measure  Help needed turning from your back to your side while in a flat bed without using bedrails?: A Little Help needed moving from lying on your back to sitting on the side of a flat bed without using bedrails?: A Little Help needed moving to and from a bed to a chair (including a wheelchair)?: A Little Help needed standing up from a chair using your arms (e.g., wheelchair or bedside chair)?: A Little Help needed to walk in hospital room?: A Little Help needed climbing 3-5 steps with a railing? : A Lot 6 Click Score: 17    End of Session Equipment Utilized During Treatment: Gait belt;Oxygen Activity Tolerance: Patient tolerated treatment well Patient left: in bed;with call bell/phone within reach;with bed alarm set;Other (comment) Nurse Communication: Mobility status PT Visit Diagnosis: Other abnormalities of gait and mobility (R26.89);Muscle weakness (generalized) (M62.81);History of falling (Z91.81)     Time: 8119-1478 PT Time Calculation (min) (ACUTE ONLY): 20 min  Charges:  $Therapeutic Exercise: 8-22 mins                     Salem Caster. Fairly IV, PT, DPT Physical Therapist- [facility]  08/13/2022, 4:04 PM

## 2022-08-13 NOTE — Assessment & Plan Note (Signed)
Patient with worsening cough for 1 week, leukocytosis and elevated procalcitonin on admission.  Imaging concerning for superadded infection versus progression of prior pulmonary fibrosis, recommending a repeat imaging in 3 to 4 weeks after antibiotic. He was started on Zithromax.  Penicillin allergies with swelling was noted and he never received any cephalosporin in the past per pharmacy.  Switch to Levaquin Preliminary blood cultures negative, strep pneumo and Legionella negative, -Continue Levaquin-to complete a total of 5-day course -Monitor procalcitonin

## 2022-08-13 NOTE — Assessment & Plan Note (Signed)
2D echo on 01/14/2022 showed EF of 55-60%. Patient has 2+ leg edema, positive JVD, crackles on auscultation, elevated BNP 884, clinically consistent with CHF exacerbation.  Patient was started on IV diuresis. -Continue with IV Lasix 60 mg twice daily -Daily weight and BMP -Strict intake and output

## 2022-08-13 NOTE — Progress Notes (Signed)
Progress Note   Patient: Jacob Silva HER:[id number] DOB: 1932-03-11 DOA: 08/11/2022     2 DOS: the patient was seen and examined on 08/13/2022   Brief hospital course: Taken from H&P.   JO CERONE is a 89 y.o. male with medical history significant of hypertension, hyperlipidemia, diabetes mellitus, COPD on 2-3 L oxygen, CAD, CABG, diastolic CHF, hypothyroidism, gout, pulmonary fibrosis, CKD-4, former smoker, OSA on CPAP, who presents with progressively worsening shortness breath and weight gain for the past few days.  Patient has about 10 pound weight gain recently.  Patient also has some cough with little mucus production.  No fever or chills.  Data reviewed independently and ED Course: pt was found to have WBC 11.4, BNP 884, renal function close to baseline, troponin level 23>>23, procalcitonin 2.87.  Respiratory panel negative Chest x-ray showed: Mildly increased confluence of heterogeneous opacities superimposed on a background of coarse bilateral reticular opacities. Findings may reflect superimposed infection, aspiration, progressive fibrosis or atelectasis on a background of pulmonary fibrosis. Consider follow-up PA and lateral chest radiograph in 4 weeks to assess for resolution.    EKG: I have personally reviewed.  Sinus rhythm, QTc 446, T wave inversion in V1-V2, right axis deviation.  1/15: Vitals normal.  BMP with creatinine of 0.79, it was 2.68 yesterday which was at his baseline off for the past more than a year, concern of lab error.  Preliminary blood cultures negative.  Strep pneumo negative.  Repeat BMP Penicillin allergy is noted as swelling-apparently never taken any cephalosporins in the past, starting him on Levaquin for concern of CAP.  1/16: Vitals stable.  Procalcitonin improving, currently at 1.83.  Renal function stable.  No urinary output or weight recorded.  Oxygen requirement stable at baseline.  PT is recommending home health.  Would like to do IV  diuresis for 1-2 more days, while renal function remained stable.   Assessment and Plan: * Acute on chronic diastolic CHF (congestive heart failure) ([place]) 2D echo on 01/14/2022 showed EF of 55-60%. Patient has 2+ leg edema, positive JVD, crackles on auscultation, elevated BNP 884, clinically consistent with CHF exacerbation.  Patient was started on IV diuresis. -Continue with IV Lasix 60 mg twice daily -Daily weight and BMP -Strict intake and output  Biventricular heart failure ([place]) Patient had reduced right ventricular function and dilatation during prior echo. History of pulmonary hypertension. -See above for management  CAP (community acquired pneumonia) Patient with worsening cough for 1 week, leukocytosis and elevated procalcitonin on admission.  Imaging concerning for superadded infection versus progression of prior pulmonary fibrosis, recommending a repeat imaging in 3 to 4 weeks after antibiotic. He was started on Zithromax.  Penicillin allergies with swelling was noted and he never received any cephalosporin in the past per pharmacy.  Switch to Levaquin Preliminary blood cultures negative, strep pneumo and Legionella negative, -Continue Levaquin-to complete a total of 5-day course -Monitor procalcitonin  COPD (chronic obstructive pulmonary disease) ([place]) Patient with no concern of exacerbation as there is no wheezing or increased oxygen requirement from baseline. -Continue with bronchodilators  CAD (coronary artery disease) No chest pain, barely positive troponin with a flat curve.  Most likely secondary to demand ischemia. Patient had outpatient lab which showed LDL 38 and A1c 7.4 on 08/05/2022  -Continue with Plavix and Lipitor  HLD (hyperlipidemia) -Continue diabetic  CKD (chronic kidney disease), stage IV (HCC) Creatinine around baseline of 2-2.78, similar baseline for more than a year. A.m. BMP with unusually normal creatinine which  is most likely a lab error, repeat  BMP. -Monitor renal function -Avoid nephro toxins  Hypothyroidism -Continue home Synthroid  Type II diabetes mellitus with renal manifestations ([place]) : Recent A1c 7.4, poorly controlled.  Patient is taking Farxiga, Lantus 35 units daily -Sliding scale insulin -Glargine insulin 20 units daily  Gout -Continue allopurinol   Subjective: Patient was seen and examined today.  No new complaint.  He was little upset that he is still in ED.  Physical Exam: Vitals:   08/13/22 1100 08/13/22 1200 08/13/22 1230 08/13/22 1252  BP: (!) 108/57 104/71    Pulse: 75 84 78   Resp: (!) 22 (!) 27 (!) 26 17  Temp:      TempSrc:      SpO2: 97% 100% 98%   Weight:      Height:       General.  Frail gentleman, in no acute distress. Pulmonary.  Lungs clear bilaterally, normal respiratory effort. CV.  Regular rate and rhythm, no JVD, rub or murmur. Abdomen.  Soft, nontender, nondistended, BS positive. CNS.  Alert and oriented .  No focal neurologic deficit. Extremities.  1+ LE edema, no cyanosis, pulses intact and symmetrical. Psychiatry.  Appears to have some cognitive impairment.  Data Reviewed: Prior data reviewed  Family Communication: Talked with a family friend at bedside  Disposition: Status is: Inpatient Remains inpatient appropriate because: Severity of illness  Planned Discharge Destination: Home with Home Health  DVT prophylaxis.  Lovenox Time spent: 45 minutes  This record has been created using Systems analyst. Errors have been sought and corrected,but may not always be located. Such creation errors do not reflect on the standard of care.   Author: Lorella Nimrod, MD 08/13/2022 3:01 PM  For on call review [email or web address].

## 2022-08-13 NOTE — Consult Note (Addendum)
   Heart Failure Nurse Navigator Note  HFpEF 55 to 60%.  Indeterminate diastolic dysfunction.  Ventricular systolic function is severely reduced.  Severe right atrial enlargement.  Severe tricuspid regurgitation.  He presented to the emergency room with complaints of shortness of breath and weight gain.  BNP was 884.  Comorbidities:  Hypertension Hyperlipidemia Diabetes COPD on home O2 Coronary artery disease/coronary artery bypass grafting Hypothyroidism Gout Pulmonary fibrosis Chronic kidney disease stage IV Obstructive sleep apnea compliant with CPAP Former smoker  Medications:  Aspirin 81 mg daily Atorvastatin 40 mg daily Plavix 75 mg daily Furosemide 60 mg IV every 12 hours Isosorbide mononitrate 30 mg daily Levothyroxine 100 mcg every morning  Labs:  Sodium 141, potassium 3.7, chloride 99, CO2 28, BUN 33, creatinine 2.44, estimated GFR 25 Intake not documented Output 800 mL Weight not documented   Initial meeting with patient there were no family members present at the bedside.  States that he resides at home with his wife and his wife is the 1 who prepares the meals.  States that he does not add salt at the table and denied use of processed or convenience foods.  He states maybe once a month he might have a bacon biscuit and realizes that that is higher in sodium.  Over fluid restriction of 64 ounces.  Patient states that he drinks a lot of water throughout the day and also sucks on ice.  He is unable to quantify how much of the water and ice that he does daily.  Explained that 1 cup of ice equals 1/2 cup of fluid.  In addition to water he might have 6 to 8 ounces of juice daily, along with a glass of tea.  He states that he weighs himself daily and had reported a weight gain to his physician but was told that he could not be seen until later on in this week.  And over reporting 2 pound weight gain overnight or total of 5 pounds within the week.  Also discussed  follow-up in outpatient heart failure clinic once established he could call with weight gains, changes in symptoms or concerns.  He voices understanding.  Was given the living with heart failure teaching booklet, zone magnet, info on heart failure and low sodium along with weight chart.  He has follow-up in the outpatient heart failure clinic on January 30 at 9:30 AM.  He has a 6% no-show ratio which is 5 out of 79 appointments.  Patient asked about the [facility] cardiologist and I explained that they are now coming to our clinic here in [place] that he could be seen there.  He voices understanding.  States that he is retired from [employer].  He had no further questions and we will continue to follow along.    Pricilla Riffle RN CHFN

## 2022-08-13 NOTE — ED Notes (Signed)
Pt unhappy with lunch tray. Sandwich tray, ginger ale, and menu provided. Pt denies further needs at this time, WCTM. Call light in reach.

## 2022-08-14 DIAGNOSIS — I5033 Acute on chronic diastolic (congestive) heart failure: Secondary | ICD-10-CM | POA: Diagnosis not present

## 2022-08-14 DIAGNOSIS — J9611 Chronic respiratory failure with hypoxia: Secondary | ICD-10-CM

## 2022-08-14 LAB — GLUCOSE, CAPILLARY
Glucose-Capillary: 96 mg/dL (ref 70–99)
Glucose-Capillary: 130 mg/dL — ABNORMAL HIGH (ref 70–99)
Glucose-Capillary: 90 mg/dL (ref 70–99)
Glucose-Capillary: 98 mg/dL (ref 70–99)

## 2022-08-14 LAB — BASIC METABOLIC PANEL
Anion gap: 8 (ref 5–15)
BUN: 30 mg/dL — ABNORMAL HIGH (ref 8–23)
CO2: 32 mmol/L (ref 22–32)
Calcium: 8.9 mg/dL (ref 8.9–10.3)
Chloride: 101 mmol/L (ref 98–111)
Creatinine, Ser: 2.43 mg/dL — ABNORMAL HIGH (ref 0.61–1.24)
GFR, Estimated: 25 mL/min — ABNORMAL LOW (ref >60)
Glucose, Bld: 104 mg/dL — ABNORMAL HIGH (ref 70–99)
Potassium: 3.6 mmol/L (ref 3.5–5.1)
Sodium: 141 mmol/L (ref 135–145)

## 2022-08-14 MED ORDER — DICLOFENAC SODIUM 1 % EX GEL
2.0000 g | Freq: Two times a day (BID) | CUTANEOUS | Status: DC
  Administered 2022-08-14 – 2022-08-15 (×3): 2 g via TOPICAL
  Filled 2022-08-14: qty 100

## 2022-08-14 NOTE — Progress Notes (Signed)
Occupational Therapy Treatment Patient Details Name: Jacob Silva MRN: [id number] DOB: Sep 03, 1931 Today's Date: 08/14/2022   History of present illness Jacob Silva is a 89 y.o. male with medical history significant of hypertension, hyperlipidemia, diabetes mellitus, COPD on 2-3 L oxygen, CAD, CABG, diastolic CHF, hypothyroidism, gout, pulmonary fibrosis, CKD-4, former smoker, OSA on CPAP, who presents with shortness breath and weight gain.   OT comments  Upon entering the room, pt supine in bed with wife present and feel much better than previous attempt this morning. Pt performing supine >sit without physical assistance. Pt needing assistance to thread underwear onto B feet and to don B shoes. Pt stands with min guard to pull over B hips. Pt ambulates on 2Ls via [place] 20' with pt requesting no RW and he reports wanting to return for RW. HR increased to 110's and O2 increased to 4Ls for safety. Pulse ox reading at7 9-80% but hands cold and unsure of accuracy. Pt requesting to attempt once more and stands with supervision and use of RW to ambulates 150' with close supervision before returning back to bed. Pt clearly fatigues and "flops" onto bed. Wife plans on assisting him with bathing from bed level to conserve energy. OT recommend use of RW at hospital discharge to conserve energy. All needs within reach.   Recommendations for follow up therapy are one component of a multi-disciplinary discharge planning process, led by the attending physician.  Recommendations may be updated based on patient status, additional functional criteria and insurance authorization.    Follow Up Recommendations  Home health OT     Assistance Recommended at Discharge Intermittent Supervision/Assistance  Patient can return home with the following  A little help with walking and/or transfers;A little help with bathing/dressing/bathroom;Help with stairs or ramp for entrance;Assist for transportation;Assistance with  cooking/housework;Direct supervision/assist for financial management;Direct supervision/assist for medications management   Equipment Recommendations  None recommended by OT       Precautions / Restrictions Precautions Precautions: Fall Restrictions Weight Bearing Restrictions: No       Mobility Bed Mobility Overal bed mobility: Modified Independent             General bed mobility comments: HOB elevated and use of bed rails    Transfers Overall transfer level: Needs assistance Equipment used: Rolling walker (2 wheels), None Transfers: Sit to/from Stand Sit to Stand: Supervision                 Balance Overall balance assessment: Needs assistance Sitting-balance support: No upper extremity supported, Feet supported Sitting balance-Leahy Scale: Good     Standing balance support: During functional activity, Bilateral upper extremity supported, Reliant on assistive device for balance Standing balance-Leahy Scale: Fair                             ADL either performed or assessed with clinical judgement    Extremity/Trunk Assessment Upper Extremity Assessment Upper Extremity Assessment: Generalized weakness   Lower Extremity Assessment Lower Extremity Assessment: Generalized weakness        Vision Patient Visual Report: No change from baseline            Cognition Arousal/Alertness: Awake/alert Behavior During Therapy: WFL for tasks assessed/performed Overall Cognitive Status: Within Functional Limits for tasks assessed  General Comments: HOH but pleasant and cooperative                   Pertinent Vitals/ Pain       Pain Assessment Pain Assessment: No/denies pain         Frequency  Min 2X/week        Progress Toward Goals  OT Goals(current goals can now be found in the care plan section)  Progress towards OT goals: Progressing toward goals  Acute Rehab OT  Goals Patient Stated Goal: to feel better and go home OT Goal Formulation: With patient Time For Goal Achievement: 08/26/22 Potential to Achieve Goals: Good  Plan Discharge plan remains appropriate;Frequency remains appropriate       AM-PAC OT "6 Clicks" Daily Activity     Outcome Measure   Help from another person eating meals?: None Help from another person taking care of personal grooming?: None Help from another person toileting, which includes using toliet, bedpan, or urinal?: A Little Help from another person bathing (including washing, rinsing, drying)?: A Little Help from another person to put on and taking off regular upper body clothing?: A Little Help from another person to put on and taking off regular lower body clothing?: A Little 6 Click Score: 20    End of Session Equipment Utilized During Treatment: Oxygen  OT Visit Diagnosis: Unsteadiness on feet (R26.81);Repeated falls (R29.6);Muscle weakness (generalized) (M62.81)   Activity Tolerance Patient limited by fatigue   Patient Left with call bell/phone within reach;in bed;with family/visitor present   Nurse Communication Mobility status        Time: 8786-7672 OT Time Calculation (min): 27 min  Charges: OT General Charges $OT Visit: 1 Visit OT Treatments $Therapeutic Activity: 23-37 mins  Darleen Crocker, MS, OTR/L , CBIS ascom [phone]  08/14/22, 3:09 PM

## 2022-08-14 NOTE — Progress Notes (Signed)
PROGRESS NOTE  Jacob Silva GLO:[id number] DOB: 01-21-1932 DOA: 08/11/2022 PCP: [name], MD  Hospital Course/Subjective: Jacob Silva is a 89 y.o. male with medical history significant of hypertension, hyperlipidemia, diabetes mellitus, COPD on 2-3 L oxygen, CAD, CABG, diastolic CHF, hypothyroidism, gout, pulmonary fibrosis, CKD-4, former smoker, OSA on CPAP, who presented with progressively worsening shortness breath and weight gain for the past few days.  Patient has about 10 pound weight gain recently.  Patient also has some cough with little mucus production.  No fever or chills.    Seen this AM with wife at bedside. He is on his baseline O2. No chest pain. Patient and wife feel he still has leg and abd swelling.  Assessment/Plan:  Principal Problem:   Acute on chronic diastolic CHF (congestive heart failure) (HCC) Active Problems:   Biventricular heart failure (HCC)   CAP (community acquired pneumonia)   COPD (chronic obstructive pulmonary disease) (HCC)   CAD (coronary artery disease)   Myocardial injury   HLD (hyperlipidemia)   CKD (chronic kidney disease), stage IV (HCC)   Hypothyroidism   Type II diabetes mellitus with renal manifestations (HCC)   Gout   Chronic hypoxic respiratory failure (HCC)   Assessment and Plan: * Acute on chronic diastolic CHF (congestive heart failure) ([place]) 2D echo on 01/14/2022 showed EF of 55-60%. Patient on admission had 2+ leg edema, positive JVD, crackles on auscultation, elevated BNP 884, clinically consistent with CHF exacerbation.  Patient was started on IV diuresis, tolerating well. -Continue with IV Lasix 60 mg twice daily -Daily weight and BMP, labs not ordered, pending this AM -Strict intake and output   Biventricular heart failure ([place]) Patient had reduced right ventricular function and dilatation on prior echo. History of pulmonary hypertension. -See above for management   CAP (community acquired  pneumonia) Patient with worsening cough for 1 week, leukocytosis and elevated procalcitonin on admission.  Imaging concerning for infection versus progression of prior pulmonary fibrosis, recommending a repeat imaging in 3 to 4 weeks after antibiotic. He was started on Zithromax.  Penicillin allergies with swelling was noted and he never received any cephalosporin in the past per pharmacy.  Switch to Levaquin Preliminary blood cultures negative, strep pneumo and Legionella negative, -Continue Levaquin-to complete a total of 5-day course to end 1/19   COPD (chronic obstructive pulmonary disease) ([place]) Patient with no concern of exacerbation as there is no wheezing or increased oxygen requirement from baseline. -Continue with bronchodilators  Chronic Hypoxic Respiratory failure - due to COPD   CAD (coronary artery disease). Myocardial injury due to CHF exacerbation. No chest pain, barely positive troponin with a flat curve.  Most likely secondary to demand ischemia. Patient had outpatient lab which showed LDL 38 and A1c 7.4 on 08/05/2022  -Continue with Plavix and Lipitor   HLD (hyperlipidemia) -Continue diabetic   CKD (chronic kidney disease), stage IV (HCC) Creatinine around baseline of 2-2.78, similar baseline for more than a year. -Monitor renal function daily with diuresis -Avoid nephro toxins   Hypothyroidism -Continue home Synthroid   Type II diabetes mellitus with renal manifestations ([place]) : Recent A1c 7.4, poorly controlled.  Patient is taking Farxiga, Lantus 35 units daily at home -Sliding scale insulin -Glargine insulin 20 units daily   Gout -Continue allopurinol  DVT Prophylaxis: Lovenox  Code Status: DNR   Family Communication: Discussed with wife at bedside this AM   Disposition Plan: Home with Home Health   Consultants: None   Procedures: None  Antimicrobials: Anti-infectives (From admission, onward)    Start     Dose/Rate Route Frequency Ordered Stop    08/12/22 2200  levofloxacin (LEVAQUIN) tablet 500 mg        500 mg Oral Every 48 hours 08/12/22 1119     08/12/22 1000  azithromycin (ZITHROMAX) tablet 250 mg  Status:  Discontinued       See Hyperspace for full Linked Orders Report.   250 mg Oral Daily 08/11/22 1516 08/12/22 1120   08/11/22 1530  azithromycin (ZITHROMAX) tablet 500 mg       See Hyperspace for full Linked Orders Report.   500 mg Oral Daily 08/11/22 1516 08/11/22 1530   08/11/22 1500  levofloxacin (LEVAQUIN) IVPB 750 mg  Status:  Discontinued        750 mg 100 mL/hr over 90 Minutes Intravenous  Once 08/11/22 1451 08/11/22 1516      Objective: Vitals:   08/13/22 1942 08/13/22 2350 08/14/22 0333 08/14/22 0341  BP: 114/73 126/75 98/67   Pulse: 87 (!) 104 77 (!) 105  Resp: 18 20 18    Temp: 98.5 F (36.9 C) 98.2 F (36.8 C) 97.9 F (36.6 C)   TempSrc:      SpO2: 100% 93% (!) 77% 94%  Weight:      Height:        Intake/Output Summary (Last 24 hours) at 08/14/2022 0726 Last data filed at 08/13/2022 2100 Gross per 24 hour  Intake --  Output 1400 ml  Net -1400 ml   Filed Weights   08/11/22 1129  Weight: 96.6 kg   Exam: General:  Alert, oriented, calm, in no acute distress, on 2L Eyes: EOMI, clear conjuctivae, white sclerea Neck: supple, no masses, trachea mildline  Cardiovascular: RRR, no murmurs or rubs, 2+ nonpitting peripheral edema  Respiratory: clear to auscultation bilaterally, no wheezes, no crackles, distant breath sounds Abdomen: soft, nontender, nondistended, normal bowel tones heard  Skin: dry, no rashes  Musculoskeletal: no joint effusions, normal range of motion  Psychiatric: appropriate affect, normal speech  Neurologic: extraocular muscles intact, clear speech, moving all extremities with intact sensorium   Data Reviewed: CBC: Recent Labs  Lab 08/11/22 1133 08/12/22 0414  WBC 11.4* 10.4  HGB 12.0* 11.1*  HCT 37.6* 34.9*  MCV 76.3* 76.2*  PLT 194 174   Basic Metabolic  Panel: Recent Labs  Lab 08/11/22 1133 08/11/22 1331 08/12/22 0414 08/12/22 0917 08/13/22 0530  NA 138  --  SPECIMEN CONTAMINATED, UNABLE TO PERFORM TEST(S). 139 141  K 3.8  --  SPECIMEN CONTAMINATED, UNABLE TO PERFORM TEST(S). 3.5 3.7  CL 98  --  SPECIMEN CONTAMINATED, UNABLE TO PERFORM TEST(S). 99 101  CO2 31  --  SPECIMEN CONTAMINATED, UNABLE TO PERFORM TEST(S). 28 30  GLUCOSE 117*  --  SPECIMEN CONTAMINATED, UNABLE TO PERFORM TEST(S). 131* 84  BUN 34*  --  SPECIMEN CONTAMINATED, UNABLE TO PERFORM TEST(S). 32* 33*  CREATININE 2.68*  --  SPECIMEN CONTAMINATED, UNABLE TO PERFORM TEST(S). 2.45* 2.44*  CALCIUM 8.7*  --  SPECIMEN CONTAMINATED, UNABLE TO PERFORM TEST(S). 8.7* 8.6*  MG  --  2.4  --   --   --    GFR: Estimated Creatinine Clearance: 24.8 mL/min (A) (by C-G formula based on SCr of 2.44 mg/dL (H)). Liver Function Tests: No results for input(s): "AST", "ALT", "ALKPHOS", "BILITOT", "PROT", "ALBUMIN" in the last 168 hours. No results for input(s): "LIPASE", "AMYLASE" in the last 168 hours. No results for input(s): "AMMONIA" in the last 168  hours. Coagulation Profile: No results for input(s): "INR", "PROTIME" in the last 168 hours. Cardiac Enzymes: No results for input(s): "CKTOTAL", "CKMB", "CKMBINDEX", "TROPONINI" in the last 168 hours. BNP (last 3 results) No results for input(s): "PROBNP" in the last 8760 hours. HbA1C: No results for input(s): "HGBA1C" in the last 72 hours. CBG: Recent Labs  Lab 08/12/22 2002 08/13/22 0822 08/13/22 1241 08/13/22 1656 08/13/22 2016  GLUCAP 155* 94 106* 115* 86   Lipid Profile: No results for input(s): "CHOL", "HDL", "LDLCALC", "TRIG", "CHOLHDL", "LDLDIRECT" in the last 72 hours. Thyroid Function Tests: No results for input(s): "TSH", "T4TOTAL", "FREET4", "T3FREE", "THYROIDAB" in the last 72 hours. Anemia Panel: No results for input(s): "VITAMINB12", "FOLATE", "FERRITIN", "TIBC", "IRON", "RETICCTPCT" in the last 72 hours. Urine  analysis:    Component Value Date/Time   COLORURINE STRAW (A) 02/14/2022 2157   APPEARANCEUR CLEAR 02/14/2022 2157   LABSPEC 1.005 02/14/2022 2157   PHURINE 6.0 02/14/2022 2157   GLUCOSEU 150 (A) 02/14/2022 2157   HGBUR NEGATIVE 02/14/2022 2157   BILIRUBINUR NEGATIVE 02/14/2022 2157   KETONESUR NEGATIVE 02/14/2022 2157   PROTEINUR NEGATIVE 02/14/2022 2157   NITRITE NEGATIVE 02/14/2022 2157   LEUKOCYTESUR NEGATIVE 02/14/2022 2157   Sepsis Labs: @LABRCNTIP (procalcitonin:4,lacticidven:4)  ) Recent Results (from the past 240 hour(s))  Blood culture (routine x 2)     Status: None (Preliminary result)   Collection Time: 08/11/22  2:49 PM   Specimen: BLOOD RIGHT ARM  Result Value Ref Range Status   Specimen Description BLOOD RIGHT ARM  Final   Special Requests   Final    BOTTLES DRAWN AEROBIC AND ANAEROBIC Blood Culture adequate volume   Culture   Final    NO GROWTH 3 DAYS Performed at [facility], [address], [place], [place] [address]    Report Status PENDING  Incomplete  Blood culture (routine x 2)     Status: None (Preliminary result)   Collection Time: 08/11/22  2:54 PM   Specimen: Right Antecubital; Blood  Result Value Ref Range Status   Specimen Description RIGHT ANTECUBITAL  Final   Special Requests   Final    BOTTLES DRAWN AEROBIC AND ANAEROBIC Blood Culture adequate volume   Culture   Final    NO GROWTH 3 DAYS Performed at [facility], [address], [place], [place] [address]    Report Status PENDING  Incomplete  Resp panel by RT-PCR (RSV, Flu A&B, Covid) Anterior Nasal Swab     Status: None   Collection Time: 08/11/22  4:46 PM   Specimen: Anterior Nasal Swab  Result Value Ref Range Status   SARS Coronavirus 2 by RT PCR NEGATIVE NEGATIVE Final    Comment: (NOTE) SARS-CoV-2 target nucleic acids are NOT DETECTED.  The SARS-CoV-2 RNA is generally detectable in upper respiratory specimens during the acute phase of infection. The  lowest concentration of SARS-CoV-2 viral copies this assay can detect is 138 copies/mL. A negative result does not preclude SARS-Cov-2 infection and should not be used as the sole basis for treatment or other patient management decisions. A negative result may occur with  improper specimen collection/handling, submission of specimen other than nasopharyngeal swab, presence of viral mutation(s) within the areas targeted by this assay, and inadequate number of viral copies(<138 copies/mL). A negative result must be combined with clinical observations, patient history, and epidemiological information. The expected result is Negative.  Fact Sheet for Patients:  EntrepreneurPulse.com.au  Fact Sheet for Healthcare Providers:  IncredibleEmployment.be  This test is no  t yet approved or cleared by the [place] and  has been authorized for detection and/or diagnosis of SARS-CoV-2 by FDA under an Emergency Use Authorization (EUA). This EUA will remain  in effect (meaning this test can be used) for the duration of the COVID-19 declaration under Section 564(b)(1) of the Act, 21 U.S.C.section 360bbb-3(b)(1), unless the authorization is terminated  or revoked sooner.       Influenza A by PCR NEGATIVE NEGATIVE Final   Influenza B by PCR NEGATIVE NEGATIVE Final    Comment: (NOTE) The Xpert Xpress SARS-CoV-2/FLU/RSV plus assay is intended as an aid in the diagnosis of influenza from Nasopharyngeal swab specimens and should not be used as a sole basis for treatment. Nasal washings and aspirates are unacceptable for Xpert Xpress SARS-CoV-2/FLU/RSV testing.  Fact Sheet for Patients: EntrepreneurPulse.com.au  Fact Sheet for Healthcare Providers: IncredibleEmployment.be  This test is not yet approved or cleared by the [place] FDA and has been authorized for detection and/or diagnosis of SARS-CoV-2 by FDA under  an Emergency Use Authorization (EUA). This EUA will remain in effect (meaning this test can be used) for the duration of the COVID-19 declaration under Section 564(b)(1) of the Act, 21 U.S.C. section 360bbb-3(b)(1), unless the authorization is terminated or revoked.     Resp Syncytial Virus by PCR NEGATIVE NEGATIVE Final    Comment: (NOTE) Fact Sheet for Patients: EntrepreneurPulse.com.au  Fact Sheet for Healthcare Providers: IncredibleEmployment.be  This test is not yet approved or cleared by the [place] FDA and has been authorized for detection and/or diagnosis of SARS-CoV-2 by FDA under an Emergency Use Authorization (EUA). This EUA will remain in effect (meaning this test can be used) for the duration of the COVID-19 declaration under Section 564(b)(1) of the Act, 21 U.S.C. section 360bbb-3(b)(1), unless the authorization is terminated or revoked.  Performed at [facility], [address], [place],  [address]     Studies: No results found.  Scheduled Meds:  allopurinol  50 mg Oral Q48H   arformoterol  15 mcg Nebulization Q12H   aspirin EC  81 mg Oral Daily   atorvastatin  40 mg Oral QHS   clopidogrel  75 mg Oral Daily   enoxaparin (LOVENOX) injection  30 mg Subcutaneous Q24H   furosemide  60 mg Intravenous Q12H   gabapentin  300 mg Oral BID   insulin aspart  0-5 Units Subcutaneous QHS   insulin aspart  0-9 Units Subcutaneous TID WC   insulin glargine-yfgn  20 Units Subcutaneous QHS   isosorbide mononitrate  30 mg Oral Daily   levofloxacin  500 mg Oral Q48H   levothyroxine  100 mcg Oral q morning   umeclidinium-vilanterol  1 puff Inhalation Daily   Continuous Infusions:   LOS: 3 days   Time spent: 31 minutes  Leiana Rund Marry Guan, MD [facility] Pager [phone]  If 7PM-7AM, please contact night-coverage [email or web address] Password [facility] 08/14/2022, 7:26 AM

## 2022-08-14 NOTE — Progress Notes (Signed)
Patients last void was at 8 am, patient has not taken sufficient amount of fluid, encouraged patient to drink some fluid.

## 2022-08-14 NOTE — Progress Notes (Addendum)
   Heart Failure Nurse Navigator Note  Met with the patient and his wife who was at the bedside today.  Discussed how they take care of him at home.  She is adamant that they weigh daily.  Also discussed fluid intake, she states that he likes to drink water but did not feel that he drank anywhere near the 64 ounces.  He will also suck on ice which she admits to not measuring, explained how 1 cup of ice equals 1/2 cup of fluid.  She states he also will drink a juice box and also if he is not eating lunch will drink a Glucerna.  She also voices concern about when they had last seen their primary cardiologist they were told not to come to the emergency room with heart failure as they would be turned away.  Discussed follow-up in the outpatient heart failure clinic, and made aware once they are established with Ms. Jackelyn Hoehn that they could call with any questions, concerns, weight gains or changes in symptoms and it would be dealt with that day, if his symptoms were bad enough that she would not hesitate to send him to the emergency room if need be.  He states at home prior to admission she had noted an 11 pound weight gain, he was having more swelling in his extremities as not as active as he normally was and also had no appetite with the abdomen being larger.  He said the day of admission that he complained of chest pain and she thought that he looked pale and decided to call 911.   They also voiced that they would like to be seen by the advanced heart failure doctors here in [place].  Secure chatted with Dr. Daniel Nones, to make him aware of patient wanting to be seen.  Wife and the patient had no further questions.  Will continue to follow along.  Pricilla Riffle [place]

## 2022-08-14 NOTE — Progress Notes (Signed)
Patient still unable to urinate, bladder scanned, shows 410 ml. Encouraged patient to move to the washroom, patient urinated.

## 2022-08-15 ENCOUNTER — Ambulatory Visit: Payer: Medicare Other | Admitting: Podiatry

## 2022-08-15 DIAGNOSIS — I5033 Acute on chronic diastolic (congestive) heart failure: Secondary | ICD-10-CM | POA: Diagnosis not present

## 2022-08-15 LAB — CBC
HCT: 32.8 % — ABNORMAL LOW (ref 39.0–52.0)
Hemoglobin: 10.5 g/dL — ABNORMAL LOW (ref 13.0–17.0)
MCH: 24.4 pg — ABNORMAL LOW (ref 26.0–34.0)
MCHC: 32 g/dL (ref 30.0–36.0)
MCV: 76.3 fL — ABNORMAL LOW (ref 80.0–100.0)
Platelets: 172 10*3/uL (ref 150–400)
RBC: 4.3 MIL/uL (ref 4.22–5.81)
RDW: 18.1 % — ABNORMAL HIGH (ref 11.5–15.5)
WBC: 10.5 10*3/uL (ref 4.0–10.5)
nRBC: 0 % (ref 0.0–0.2)

## 2022-08-15 LAB — GLUCOSE, CAPILLARY
Glucose-Capillary: 91 mg/dL (ref 70–99)
Glucose-Capillary: 95 mg/dL (ref 70–99)
Glucose-Capillary: 105 mg/dL — ABNORMAL HIGH (ref 70–99)
Glucose-Capillary: 123 mg/dL — ABNORMAL HIGH (ref 70–99)

## 2022-08-15 LAB — BASIC METABOLIC PANEL
Anion gap: 8 (ref 5–15)
BUN: 32 mg/dL — ABNORMAL HIGH (ref 8–23)
CO2: 32 mmol/L (ref 22–32)
Calcium: 8.7 mg/dL — ABNORMAL LOW (ref 8.9–10.3)
Chloride: 102 mmol/L (ref 98–111)
Creatinine, Ser: 2.38 mg/dL — ABNORMAL HIGH (ref 0.61–1.24)
GFR, Estimated: 26 mL/min — ABNORMAL LOW (ref >60)
Glucose, Bld: 106 mg/dL — ABNORMAL HIGH (ref 70–99)
Potassium: 3.4 mmol/L — ABNORMAL LOW (ref 3.5–5.1)
Sodium: 142 mmol/L (ref 135–145)

## 2022-08-15 MED ORDER — POTASSIUM CHLORIDE CRYS ER 20 MEQ PO TBCR
40.0000 meq | EXTENDED_RELEASE_TABLET | Freq: Once | ORAL | Status: AC
  Administered 2022-08-15: 40 meq via ORAL
  Filled 2022-08-15: qty 2

## 2022-08-15 NOTE — Progress Notes (Signed)
PT Cancellation Note  Patient Details Name: Jacob Silva MRN: [id number] DOB: Jun 17, 1932   Cancelled Treatment:     Pt adamantly refused any activity. Continue PT per POC next available date/time.   Josie Dixon 08/15/2022, 4:10 PM

## 2022-08-15 NOTE — Progress Notes (Signed)
   Heart Failure Nurse Navigator Note  Secure chatted with Dr.  Hollice Gong regarding consult by the Advanced Heart Failure Team, he is in agreement that the patient should be seen by them.  Dr. Daniel Nones made aware.  Also spoke with the patient about the consult and made his nurse Anderson Malta aware.  Wife was not present at the time.  Patient states he is doing better today.  Pricilla Riffle RN [facility]

## 2022-08-15 NOTE — Progress Notes (Signed)
PROGRESS NOTE  Jacob Silva VEH:[id number] DOB: 1932/07/08 DOA: 08/11/2022 PCP: [name], MD  Hospital Course/Subjective: Jacob Silva is a 89 y.o. male with medical history significant of hypertension, hyperlipidemia, diabetes mellitus, COPD on 2-3 L oxygen, CAD, CABG, diastolic CHF, hypothyroidism, gout, pulmonary fibrosis, CKD-4, former smoker, OSA on CPAP, who presented with progressively worsening shortness breath and weight gain for the past few days.  Patient has about 10 pound weight gain recently.  Patient also has some cough with little mucus production.  No fever or chills.    Seen this AM and also chatted with wife in the hallway. He is doing well, had some trouble urinating overnight, but was eventually able to do so after ambulating in his room. Patient feels his abdominal fullness is improving.  Assessment/Plan:  Principal Problem:   Acute on chronic diastolic CHF (congestive heart failure) (HCC) Active Problems:   Biventricular heart failure (HCC)   CAP (community acquired pneumonia)   COPD (chronic obstructive pulmonary disease) (HCC)   CAD (coronary artery disease)   Myocardial injury   HLD (hyperlipidemia)   CKD (chronic kidney disease), stage IV (HCC)   Hypothyroidism   Type II diabetes mellitus with renal manifestations (HCC)   Gout   Chronic hypoxic respiratory failure (HCC)   Assessment and Plan: * Acute on chronic diastolic CHF (congestive heart failure) ([place]) 2D echo on 01/14/2022 showed EF of 55-60%. Patient on admission had 2+ leg edema, positive JVD, crackles on auscultation, elevated BNP 884, clinically consistent with CHF exacerbation.  Patient was started on IV diuresis, tolerating well. -Continue with IV Lasix 60 mg twice daily -Daily weight and BMP, Cr stable, K low today -Strict intake and output  HypoKalemia  Due to lasix Replete orally this AM and recheck with morning labs   Biventricular heart failure [facility]) Patient had reduced  right ventricular function and dilatation on prior echo. History of pulmonary hypertension. -See above for management   CAP (community acquired pneumonia) Patient with worsening cough for 1 week, leukocytosis and elevated procalcitonin on admission.  Imaging concerning for infection versus progression of prior pulmonary fibrosis, recommending a repeat imaging in 3 to 4 weeks after antibiotic. He was started on Zithromax.  Penicillin allergies with swelling was noted and he never received any cephalosporin in the past per pharmacy.  Switch to Levaquin Preliminary blood cultures negative, strep pneumo and Legionella negative, -Continue Levaquin-to complete a total of 5-day course to end 1/19   COPD (chronic obstructive pulmonary disease) ([place]) Patient with no concern of exacerbation as there is no wheezing or increased oxygen requirement from baseline. -Continue with bronchodilators  Chronic Hypoxic Respiratory failure - due to COPD, at baseline   CAD (coronary artery disease). Myocardial injury due to CHF exacerbation. No chest pain, barely positive troponin with a flat curve.  Most likely secondary to demand ischemia. Patient had outpatient lab which showed LDL 38 and A1c 7.4 on 08/05/2022  -Continue with Plavix and Lipitor   HLD (hyperlipidemia) -Continue diabetic   CKD (chronic kidney disease), stage IV (HCC) Creatinine around baseline of 2-2.78, similar baseline for more than a year. -Monitor renal function daily with diuresis Lab Results  Component Value Date   CREATININE 2.38 (H) 08/15/2022   CREATININE 2.43 (H) 08/14/2022   CREATININE 2.44 (H) 08/13/2022  -Avoid nephro toxins   Hypothyroidism -Continue home Synthroid   Type II diabetes mellitus with renal manifestations ([place]) : Recent A1c 7.4, poorly controlled.  Patient is taking Wilder Glade, Lantus 35  units daily at home -Sliding scale insulin -Glargine insulin 20 units daily   Gout -Continue allopurinol  DVT  Prophylaxis: Lovenox  Code Status: DNR   Family Communication: Discussed with wife this AM before talking to patient.  Disposition Plan: Home with Home Health   Consultants: None   Procedures: None   Antimicrobials: Anti-infectives (From admission, onward)    Start     Dose/Rate Route Frequency Ordered Stop   08/12/22 2200  levofloxacin (LEVAQUIN) tablet 500 mg        500 mg Oral Every 48 hours 08/12/22 1119 08/18/22 2159   08/12/22 1000  azithromycin (ZITHROMAX) tablet 250 mg  Status:  Discontinued       See Hyperspace for full Linked Orders Report.   250 mg Oral Daily 08/11/22 1516 08/12/22 1120   08/11/22 1530  azithromycin (ZITHROMAX) tablet 500 mg       See Hyperspace for full Linked Orders Report.   500 mg Oral Daily 08/11/22 1516 08/11/22 1530   08/11/22 1500  levofloxacin (LEVAQUIN) IVPB 750 mg  Status:  Discontinued        750 mg 100 mL/hr over 90 Minutes Intravenous  Once 08/11/22 1451 08/11/22 1516      Objective: Vitals:   08/15/22 0000 08/15/22 0433 08/15/22 0619 08/15/22 0732  BP: 123/76 97/65 108/67 109/69  Pulse: 80 88 87 69  Resp: 20 20 17  (!) 24  Temp: 97.9 F (36.6 C) 97.7 F (36.5 C)  97.9 F (36.6 C)  TempSrc: Oral   Oral  SpO2: 96% 93% 94% 95%  Weight:      Height:        Intake/Output Summary (Last 24 hours) at 08/15/2022 0750 Last data filed at 08/15/2022 0645 Gross per 24 hour  Intake --  Output 1150 ml  Net -1150 ml    Filed Weights   08/11/22 1129  Weight: 96.6 kg   Exam: General:  Alert, oriented, calm, in no acute distress, resting in bed on 2L  Eyes: EOMI, clear conjuctivae, white sclerea Neck: supple, no masses, trachea mildline  Cardiovascular: RRR, no murmurs or rubs, he has 1-2+ pitting edema  Respiratory: clear to auscultation bilaterally, no wheezes, no crackles  Abdomen: soft, nontender, nondistended, normal bowel tones heard  Skin: dry, no rashes  Musculoskeletal: no joint effusions, normal range of motion   Psychiatric: appropriate affect, normal speech  Neurologic: extraocular muscles intact, clear speech, moving all extremities with intact sensorium   Data Reviewed: CBC: Recent Labs  Lab 08/11/22 1133 08/12/22 0414 08/15/22 0205  WBC 11.4* 10.4 10.5  HGB 12.0* 11.1* 10.5*  HCT 37.6* 34.9* 32.8*  MCV 76.3* 76.2* 76.3*  PLT 194 177 482    Basic Metabolic Panel: Recent Labs  Lab 08/11/22 1331 08/12/22 0414 08/12/22 0917 08/13/22 0530 08/14/22 0805 08/15/22 0205  NA  --  SPECIMEN CONTAMINATED, UNABLE TO PERFORM TEST(S). 139 141 141 142  K  --  SPECIMEN CONTAMINATED, UNABLE TO PERFORM TEST(S). 3.5 3.7 3.6 3.4*  CL  --  SPECIMEN CONTAMINATED, UNABLE TO PERFORM TEST(S). 99 101 101 102  CO2  --  SPECIMEN CONTAMINATED, UNABLE TO PERFORM TEST(S). 28 30 32 32  GLUCOSE  --  SPECIMEN CONTAMINATED, UNABLE TO PERFORM TEST(S). 131* 84 104* 106*  BUN  --  SPECIMEN CONTAMINATED, UNABLE TO PERFORM TEST(S). 32* 33* 30* 32*  CREATININE  --  SPECIMEN CONTAMINATED, UNABLE TO PERFORM TEST(S). 2.45* 2.44* 2.43* 2.38*  CALCIUM  --  SPECIMEN CONTAMINATED, UNABLE TO PERFORM TEST(S). 8.7*  8.6* 8.9 8.7*  MG 2.4  --   --   --   --   --     GFR: Estimated Creatinine Clearance: 25.4 mL/min (A) (by C-G formula based on SCr of 2.38 mg/dL (H)). Liver Function Tests: No results for input(s): "AST", "ALT", "ALKPHOS", "BILITOT", "PROT", "ALBUMIN" in the last 168 hours. No results for input(s): "LIPASE", "AMYLASE" in the last 168 hours. No results for input(s): "AMMONIA" in the last 168 hours. Coagulation Profile: No results for input(s): "INR", "PROTIME" in the last 168 hours. Cardiac Enzymes: No results for input(s): "CKTOTAL", "CKMB", "CKMBINDEX", "TROPONINI" in the last 168 hours. BNP (last 3 results) No results for input(s): "PROBNP" in the last 8760 hours. HbA1C: No results for input(s): "HGBA1C" in the last 72 hours. CBG: Recent Labs  Lab 08/14/22 0811 08/14/22 1150 08/14/22 1715  08/14/22 2024 08/15/22 0734  GLUCAP 96 130* 90 98 91    Lipid Profile: No results for input(s): "CHOL", "HDL", "LDLCALC", "TRIG", "CHOLHDL", "LDLDIRECT" in the last 72 hours. Thyroid Function Tests: No results for input(s): "TSH", "T4TOTAL", "FREET4", "T3FREE", "THYROIDAB" in the last 72 hours. Anemia Panel: No results for input(s): "VITAMINB12", "FOLATE", "FERRITIN", "TIBC", "IRON", "RETICCTPCT" in the last 72 hours. Urine analysis:    Component Value Date/Time   COLORURINE STRAW (A) 02/14/2022 2157   APPEARANCEUR CLEAR 02/14/2022 2157   LABSPEC 1.005 02/14/2022 2157   PHURINE 6.0 02/14/2022 2157   GLUCOSEU 150 (A) 02/14/2022 2157   HGBUR NEGATIVE 02/14/2022 2157   BILIRUBINUR NEGATIVE 02/14/2022 2157   KETONESUR NEGATIVE 02/14/2022 2157   PROTEINUR NEGATIVE 02/14/2022 2157   NITRITE NEGATIVE 02/14/2022 2157   LEUKOCYTESUR NEGATIVE 02/14/2022 2157   Sepsis Labs: @LABRCNTIP (procalcitonin:4,lacticidven:4)  ) Recent Results (from the past 240 hour(s))  Blood culture (routine x 2)     Status: None (Preliminary result)   Collection Time: 08/11/22  2:49 PM   Specimen: BLOOD RIGHT ARM  Result Value Ref Range Status   Specimen Description BLOOD RIGHT ARM  Final   Special Requests   Final    BOTTLES DRAWN AEROBIC AND ANAEROBIC Blood Culture adequate volume   Culture   Final    NO GROWTH 4 DAYS Performed at [facility], [address], [place], [place] [address]    Report Status PENDING  Incomplete  Blood culture (routine x 2)     Status: None (Preliminary result)   Collection Time: 08/11/22  2:54 PM   Specimen: Right Antecubital; Blood  Result Value Ref Range Status   Specimen Description RIGHT ANTECUBITAL  Final   Special Requests   Final    BOTTLES DRAWN AEROBIC AND ANAEROBIC Blood Culture adequate volume   Culture   Final    NO GROWTH 4 DAYS Performed at [facility], [address] [place], [place] [address]    Report Status PENDING  Incomplete   Resp panel by RT-PCR (RSV, Flu A&B, Covid) Anterior Nasal Swab     Status: None   Collection Time: 08/11/22  4:46 PM   Specimen: Anterior Nasal Swab  Result Value Ref Range Status   SARS Coronavirus 2 by RT PCR NEGATIVE NEGATIVE Final    Comment: (NOTE) SARS-CoV-2 target nucleic acids are NOT DETECTED.  The SARS-CoV-2 RNA is generally detectable in upper respiratory specimens during the acute phase of infection. The lowest concentration of SARS-CoV-2 viral copies this assay can detect is 138 copies/mL. A negative result does not preclude SARS-Cov-2 infection and should not be used as the sole basis for  treatment or other patient management decisions. A negative result may occur with  improper specimen collection/handling, submission of specimen other than nasopharyngeal swab, presence of viral mutation(s) within the areas targeted by this assay, and inadequate number of viral copies(<138 copies/mL). A negative result must be combined with clinical observations, patient history, and epidemiological information. The expected result is Negative.  Fact Sheet for Patients:  EntrepreneurPulse.com.au  Fact Sheet for Healthcare Providers:  IncredibleEmployment.be  This test is no t yet approved or cleared by the [place] FDA and  has been authorized for detection and/or diagnosis of SARS-CoV-2 by FDA under an Emergency Use Authorization (EUA). This EUA will remain  in effect (meaning this test can be used) for the duration of the COVID-19 declaration under Section 564(b)(1) of the Act, 21 U.S.C.section 360bbb-3(b)(1), unless the authorization is terminated  or revoked sooner.       Influenza A by PCR NEGATIVE NEGATIVE Final   Influenza B by PCR NEGATIVE NEGATIVE Final    Comment: (NOTE) The Xpert Xpress SARS-CoV-2/FLU/RSV plus assay is intended as an aid in the diagnosis of influenza from Nasopharyngeal swab specimens and should not be used  as a sole basis for treatment. Nasal washings and aspirates are unacceptable for Xpert Xpress SARS-CoV-2/FLU/RSV testing.  Fact Sheet for Patients: EntrepreneurPulse.com.au  Fact Sheet for Healthcare Providers: IncredibleEmployment.be  This test is not yet approved or cleared by the [place] FDA and has been authorized for detection and/or diagnosis of SARS-CoV-2 by FDA under an Emergency Use Authorization (EUA). This EUA will remain in effect (meaning this test can be used) for the duration of the COVID-19 declaration under Section 564(b)(1) of the Act, 21 U.S.C. section 360bbb-3(b)(1), unless the authorization is terminated or revoked.     Resp Syncytial Virus by PCR NEGATIVE NEGATIVE Final    Comment: (NOTE) Fact Sheet for Patients: EntrepreneurPulse.com.au  Fact Sheet for Healthcare Providers: IncredibleEmployment.be  This test is not yet approved or cleared by the [place] FDA and has been authorized for detection and/or diagnosis of SARS-CoV-2 by FDA under an Emergency Use Authorization (EUA). This EUA will remain in effect (meaning this test can be used) for the duration of the COVID-19 declaration under Section 564(b)(1) of the Act, 21 U.S.C. section 360bbb-3(b)(1), unless the authorization is terminated or revoked.  Performed at [facility], [address] [place], [place] [address]     Studies: No results found.  Scheduled Meds:  allopurinol  50 mg Oral Q48H   arformoterol  15 mcg Nebulization Q12H   aspirin EC  81 mg Oral Daily   atorvastatin  40 mg Oral QHS   clopidogrel  75 mg Oral Daily   diclofenac Sodium  2 g Topical BID   enoxaparin (LOVENOX) injection  30 mg Subcutaneous Q24H   furosemide  60 mg Intravenous Q12H   gabapentin  300 mg Oral BID   insulin aspart  0-5 Units Subcutaneous QHS   insulin aspart  0-9 Units Subcutaneous TID WC   insulin  glargine-yfgn  20 Units Subcutaneous QHS   isosorbide mononitrate  30 mg Oral Daily   levofloxacin  500 mg Oral Q48H   levothyroxine  100 mcg Oral q morning   potassium chloride  40 mEq Oral Once   umeclidinium-vilanterol  1 puff Inhalation Daily   Continuous Infusions:   LOS: 4 days   Time spent: 31 minutes  Happy Begeman Marry Guan, MD [facility] Pager [phone]  If 7PM-7AM, please contact night-coverage [email or web address] Password [facility] 08/15/2022,  7:50 AM

## 2022-08-16 DIAGNOSIS — I5033 Acute on chronic diastolic (congestive) heart failure: Secondary | ICD-10-CM | POA: Diagnosis not present

## 2022-08-16 LAB — CBC
HCT: 34.5 % — ABNORMAL LOW (ref 39.0–52.0)
Hemoglobin: 10.8 g/dL — ABNORMAL LOW (ref 13.0–17.0)
MCH: 23.8 pg — ABNORMAL LOW (ref 26.0–34.0)
MCHC: 31.3 g/dL (ref 30.0–36.0)
MCV: 76 fL — ABNORMAL LOW (ref 80.0–100.0)
Platelets: 170 10*3/uL (ref 150–400)
RBC: 4.54 MIL/uL (ref 4.22–5.81)
RDW: 18 % — ABNORMAL HIGH (ref 11.5–15.5)
WBC: 9.5 10*3/uL (ref 4.0–10.5)
nRBC: 0 % (ref 0.0–0.2)

## 2022-08-16 LAB — BASIC METABOLIC PANEL
Anion gap: 7 (ref 5–15)
BUN: 31 mg/dL — ABNORMAL HIGH (ref 8–23)
CO2: 30 mmol/L (ref 22–32)
Calcium: 8.7 mg/dL — ABNORMAL LOW (ref 8.9–10.3)
Chloride: 103 mmol/L (ref 98–111)
Creatinine, Ser: 2.49 mg/dL — ABNORMAL HIGH (ref 0.61–1.24)
GFR, Estimated: 24 mL/min — ABNORMAL LOW (ref >60)
Glucose, Bld: 93 mg/dL (ref 70–99)
Potassium: 3.9 mmol/L (ref 3.5–5.1)
Sodium: 140 mmol/L (ref 135–145)

## 2022-08-16 LAB — CULTURE, BLOOD (ROUTINE X 2)
Culture: NO GROWTH
Culture: NO GROWTH
Special Requests: ADEQUATE
Special Requests: ADEQUATE

## 2022-08-16 LAB — GLUCOSE, CAPILLARY
Glucose-Capillary: 93 mg/dL (ref 70–99)
Glucose-Capillary: 178 mg/dL — ABNORMAL HIGH (ref 70–99)
Glucose-Capillary: 128 mg/dL — ABNORMAL HIGH (ref 70–99)

## 2022-08-16 MED ORDER — TORSEMIDE 20 MG PO TABS: 60.0000 mg | ORAL_TABLET | Freq: Every day | ORAL | 0 refills | Status: DC

## 2022-08-16 MED ORDER — POTASSIUM CHLORIDE CRYS ER 20 MEQ PO TBCR
40.0000 meq | EXTENDED_RELEASE_TABLET | Freq: Once | ORAL | Status: AC
  Administered 2022-08-16: 40 meq via ORAL
  Filled 2022-08-16: qty 2

## 2022-08-16 MED ORDER — LANTUS SOLOSTAR 100 UNIT/ML ~~LOC~~ SOPN: 20.0000 [IU] | PEN_INJECTOR | Freq: Every day | SUBCUTANEOUS | 11 refills | Status: DC

## 2022-08-16 MED ORDER — METOLAZONE 2.5 MG PO TABS: 2.5000 mg | ORAL_TABLET | Freq: Every day | ORAL | 0 refills | Status: DC | PRN

## 2022-08-16 MED ORDER — ALLOPURINOL 100 MG PO TABS: 50.0000 mg | ORAL_TABLET | ORAL | 0 refills | Status: DC

## 2022-08-16 MED ORDER — GABAPENTIN 300 MG PO CAPS: 300.0000 mg | ORAL_CAPSULE | Freq: Two times a day (BID) | ORAL | 0 refills | Status: DC

## 2022-08-16 NOTE — Progress Notes (Signed)
   Received call from patients wife, Tye [place] to make me aware she is going to be out of the hospital for an appointment between 11 AM and 3 PM.  She was wondering what time the [facility] doctor would be rounding on them.  I told her at this time I did not know, but would try to make him aware of the appointment.  She also voiced  that she and her husband would like to speak with Palliative care. Attending made aware.  Pricilla Riffle RN [facility]

## 2022-08-16 NOTE — Progress Notes (Signed)
PROGRESS NOTE  Jacob Silva JJK:[id number] DOB: 1932-06-14 DOA: 08/11/2022 PCP: [name], MD  Hospital Course/Subjective: Jacob Silva is a 89 y.o. male with medical history significant of hypertension, hyperlipidemia, diabetes mellitus, COPD on 2-3 L oxygen, CAD, CABG, diastolic CHF, hypothyroidism, gout, pulmonary fibrosis, CKD-4, former smoker, OSA on CPAP, who presented with progressively worsening shortness breath and weight gain for the past few days.  Patient has about 10 pound weight gain recently.  Patient also has some cough with little mucus production.  No fever or chills.    Seen this AM with wife at the bedside. He is feeling well, on 2L, and agrees that he is less fluid overloaded. He is anticipating being seen by heart failure team today.  Assessment/Plan:  Principal Problem:   Acute on chronic diastolic CHF (congestive heart failure) (HCC) Active Problems:   Biventricular heart failure (HCC)   CAP (community acquired pneumonia)   COPD (chronic obstructive pulmonary disease) (HCC)   CAD (coronary artery disease)   Myocardial injury   HLD (hyperlipidemia)   CKD (chronic kidney disease), stage IV (HCC)   Hypothyroidism   Type II diabetes mellitus with renal manifestations (HCC)   Gout   Chronic hypoxic respiratory failure (HCC)   Assessment and Plan: * Acute on chronic diastolic CHF (congestive heart failure) ([place]) 2D echo on 01/14/2022 showed EF of 55-60%. Patient on admission had 2+ leg edema, positive JVD, crackles on auscultation, elevated BNP 884, clinically consistent with CHF exacerbation but is doing much better now after diuresis.   -Continue with IV Lasix 60 mg twice daily for now, likely transition to PO soon -Daily weight and BMP, Cr stable, K low today -Strict intake and output -d/w Dr. Daniel Nones, anticipate they will consult today  HypoKalemia  Due to lasix, repleted and normal today   Biventricular heart failure [facility]) Patient had  reduced right ventricular function and dilatation on prior echo. History of pulmonary hypertension. -See above for management   CAP (community acquired pneumonia) Patient with worsening cough for 1 week, leukocytosis and elevated procalcitonin on admission.  Imaging concerning for infection versus progression of prior pulmonary fibrosis, recommending a repeat imaging in 3 to 4 weeks after antibiotic. He was started on Zithromax.  Penicillin allergies with swelling was noted and he never received any cephalosporin in the past per pharmacy.  Switch to Levaquin Preliminary blood cultures negative, strep pneumo and Legionella negative, -Continue Levaquin-to complete a total of 5-day course to end today 1/19   COPD (chronic obstructive pulmonary disease) ([place]) Patient with no concern of exacerbation as there is no wheezing or increased oxygen requirement from baseline. -Continue with bronchodilators  Chronic Hypoxic Respiratory failure - due to COPD, at baseline   CAD (coronary artery disease). Myocardial injury due to CHF exacerbation. No chest pain, barely positive troponin with a flat curve.  Most likely secondary to demand ischemia. Patient had outpatient lab which showed LDL 38 and A1c 7.4 on 08/05/2022  -Continue with Plavix and Lipitor   HLD (hyperlipidemia) -Continue diabetic   CKD (chronic kidney disease), stage IV (HCC) Creatinine around baseline of 2-2.78, similar baseline for more than a year. -Monitor renal function daily with diuresis Lab Results  Component Value Date   CREATININE 2.49 (H) 08/16/2022   CREATININE 2.38 (H) 08/15/2022   CREATININE 2.43 (H) 08/14/2022  -Avoid nephro toxins   Hypothyroidism -Continue home Synthroid   Type II diabetes mellitus with renal manifestations ([place]) : Recent A1c 7.4, poorly controlled.  Patient  is taking Farxiga, Lantus 35 units daily at home -Sliding scale insulin -Glargine insulin 20 units daily   Gout -Continue  allopurinol  DVT Prophylaxis: Lovenox  Code Status: DNR   Family Communication: Wife at bedside.  Disposition Plan: Home with Home Health, likely will be medically stable in next 1-2 days.  Consultants: HF Cardiologist   Procedures: None   Antimicrobials: Anti-infectives (From admission, onward)    Start     Dose/Rate Route Frequency Ordered Stop   08/12/22 2200  levofloxacin (LEVAQUIN) tablet 500 mg        500 mg Oral Every 48 hours 08/12/22 1119 08/18/22 2159   08/12/22 1000  azithromycin (ZITHROMAX) tablet 250 mg  Status:  Discontinued       See Hyperspace for full Linked Orders Report.   250 mg Oral Daily 08/11/22 1516 08/12/22 1120   08/11/22 1530  azithromycin (ZITHROMAX) tablet 500 mg       See Hyperspace for full Linked Orders Report.   500 mg Oral Daily 08/11/22 1516 08/11/22 1530   08/11/22 1500  levofloxacin (LEVAQUIN) IVPB 750 mg  Status:  Discontinued        750 mg 100 mL/hr over 90 Minutes Intravenous  Once 08/11/22 1451 08/11/22 1516      Objective: Vitals:   08/15/22 2000 08/15/22 2337 08/16/22 0428 08/16/22 0738  BP: 102/72 125/79 109/71   Pulse: 85 77 87   Resp: 18 18 16    Temp: 98.7 F (37.1 C) 98.1 F (36.7 C) 98.6 F (37 C)   TempSrc: Oral Oral Oral   SpO2: 99% 98% 96% 97%  Weight:      Height:        Intake/Output Summary (Last 24 hours) at 08/16/2022 0815 Last data filed at 08/16/2022 0700 Gross per 24 hour  Intake 300 ml  Output 200 ml  Net 100 ml    Filed Weights   08/11/22 1129 08/15/22 0853  Weight: 96.6 kg 91.5 kg   Exam: General:  Alert, oriented, calm, in no acute distress, sitting on bedside on 2L Eyes: EOMI, clear conjuctivae, white sclerea Neck: supple, no masses, trachea mildline  Cardiovascular: RRR, no murmurs or rubs, 1+ pitting LE edema  Respiratory: clear to auscultation bilaterally, no wheezes, no crackles  Abdomen: soft, nontender, nondistended, normal bowel tones heard  Skin: dry, no rashes  Musculoskeletal:  no joint effusions, normal range of motion  Psychiatric: appropriate affect, normal speech  Neurologic: extraocular muscles intact, clear speech, moving all extremities with intact sensorium   Data Reviewed: CBC: Recent Labs  Lab 08/11/22 1133 08/12/22 0414 08/15/22 0205 08/16/22 0614  WBC 11.4* 10.4 10.5 9.5  HGB 12.0* 11.1* 10.5* 10.8*  HCT 37.6* 34.9* 32.8* 34.5*  MCV 76.3* 76.2* 76.3* 76.0*  PLT 194 177 172 710    Basic Metabolic Panel: Recent Labs  Lab 08/11/22 1331 08/12/22 0414 08/12/22 0917 08/13/22 0530 08/14/22 0805 08/15/22 0205 08/16/22 0614  NA  --    < > 139 141 141 142 140  K  --    < > 3.5 3.7 3.6 3.4* 3.9  CL  --    < > 99 101 101 102 103  CO2  --    < > 28 30 32 32 30  GLUCOSE  --    < > 131* 84 104* 106* 93  BUN  --    < > 32* 33* 30* 32* 31*  CREATININE  --    < > 2.45* 2.44* 2.43* 2.38* 2.49*  CALCIUM  --    < > 8.7* 8.6* 8.9 8.7* 8.7*  MG 2.4  --   --   --   --   --   --    < > = values in this interval not displayed.    GFR: Estimated Creatinine Clearance: 23.7 mL/min (A) (by C-G formula based on SCr of 2.49 mg/dL (H)). Liver Function Tests: No results for input(s): "AST", "ALT", "ALKPHOS", "BILITOT", "PROT", "ALBUMIN" in the last 168 hours. No results for input(s): "LIPASE", "AMYLASE" in the last 168 hours. No results for input(s): "AMMONIA" in the last 168 hours. Coagulation Profile: No results for input(s): "INR", "PROTIME" in the last 168 hours. Cardiac Enzymes: No results for input(s): "CKTOTAL", "CKMB", "CKMBINDEX", "TROPONINI" in the last 168 hours. BNP (last 3 results) No results for input(s): "PROBNP" in the last 8760 hours. HbA1C: No results for input(s): "HGBA1C" in the last 72 hours. CBG: Recent Labs  Lab 08/15/22 0734 08/15/22 1155 08/15/22 1638 08/15/22 2137 08/16/22 0812  GLUCAP 91 95 105* 123* 93    Lipid Profile: No results for input(s): "CHOL", "HDL", "LDLCALC", "TRIG", "CHOLHDL", "LDLDIRECT" in the last 72  hours. Thyroid Function Tests: No results for input(s): "TSH", "T4TOTAL", "FREET4", "T3FREE", "THYROIDAB" in the last 72 hours. Anemia Panel: No results for input(s): "VITAMINB12", "FOLATE", "FERRITIN", "TIBC", "IRON", "RETICCTPCT" in the last 72 hours. Urine analysis:    Component Value Date/Time   COLORURINE STRAW (A) 02/14/2022 2157   APPEARANCEUR CLEAR 02/14/2022 2157   LABSPEC 1.005 02/14/2022 2157   PHURINE 6.0 02/14/2022 2157   GLUCOSEU 150 (A) 02/14/2022 2157   HGBUR NEGATIVE 02/14/2022 2157   BILIRUBINUR NEGATIVE 02/14/2022 2157   KETONESUR NEGATIVE 02/14/2022 2157   PROTEINUR NEGATIVE 02/14/2022 2157   NITRITE NEGATIVE 02/14/2022 2157   LEUKOCYTESUR NEGATIVE 02/14/2022 2157   Sepsis Labs: @LABRCNTIP (procalcitonin:4,lacticidven:4)  ) Recent Results (from the past 240 hour(s))  Blood culture (routine x 2)     Status: None   Collection Time: 08/11/22  2:49 PM   Specimen: BLOOD RIGHT ARM  Result Value Ref Range Status   Specimen Description BLOOD RIGHT ARM  Final   Special Requests   Final    BOTTLES DRAWN AEROBIC AND ANAEROBIC Blood Culture adequate volume   Culture   Final    NO GROWTH 5 DAYS Performed at [facility], [address], [place], [place] [address]    Report Status 08/16/2022 FINAL  Final  Blood culture (routine x 2)     Status: None   Collection Time: 08/11/22  2:54 PM   Specimen: Right Antecubital; Blood  Result Value Ref Range Status   Specimen Description RIGHT ANTECUBITAL  Final   Special Requests   Final    BOTTLES DRAWN AEROBIC AND ANAEROBIC Blood Culture adequate volume   Culture   Final    NO GROWTH 5 DAYS Performed at [facility], [address], [place], [place] [address]    Report Status 08/16/2022 FINAL  Final  Resp panel by RT-PCR (RSV, Flu A&B, Covid) Anterior Nasal Swab     Status: None   Collection Time: 08/11/22  4:46 PM   Specimen: Anterior Nasal Swab  Result Value Ref Range Status   SARS Coronavirus 2  by RT PCR NEGATIVE NEGATIVE Final    Comment: (NOTE) SARS-CoV-2 target nucleic acids are NOT DETECTED.  The SARS-CoV-2 RNA is generally detectable in upper respiratory specimens during the acute phase of infection. The lowest concentration of SARS-CoV-2 viral copies this assay can  detect is 138 copies/mL. A negative result does not preclude SARS-Cov-2 infection and should not be used as the sole basis for treatment or other patient management decisions. A negative result may occur with  improper specimen collection/handling, submission of specimen other than nasopharyngeal swab, presence of viral mutation(s) within the areas targeted by this assay, and inadequate number of viral copies(<138 copies/mL). A negative result must be combined with clinical observations, patient history, and epidemiological information. The expected result is Negative.  Fact Sheet for Patients:  EntrepreneurPulse.com.au  Fact Sheet for Healthcare Providers:  IncredibleEmployment.be  This test is no t yet approved or cleared by the [place] FDA and  has been authorized for detection and/or diagnosis of SARS-CoV-2 by FDA under an Emergency Use Authorization (EUA). This EUA will remain  in effect (meaning this test can be used) for the duration of the COVID-19 declaration under Section 564(b)(1) of the Act, 21 U.S.C.section 360bbb-3(b)(1), unless the authorization is terminated  or revoked sooner.       Influenza A by PCR NEGATIVE NEGATIVE Final   Influenza B by PCR NEGATIVE NEGATIVE Final    Comment: (NOTE) The Xpert Xpress SARS-CoV-2/FLU/RSV plus assay is intended as an aid in the diagnosis of influenza from Nasopharyngeal swab specimens and should not be used as a sole basis for treatment. Nasal washings and aspirates are unacceptable for Xpert Xpress SARS-CoV-2/FLU/RSV testing.  Fact Sheet for Patients: EntrepreneurPulse.com.au  Fact  Sheet for Healthcare Providers: IncredibleEmployment.be  This test is not yet approved or cleared by the [place] FDA and has been authorized for detection and/or diagnosis of SARS-CoV-2 by FDA under an Emergency Use Authorization (EUA). This EUA will remain in effect (meaning this test can be used) for the duration of the COVID-19 declaration under Section 564(b)(1) of the Act, 21 U.S.C. section 360bbb-3(b)(1), unless the authorization is terminated or revoked.     Resp Syncytial Virus by PCR NEGATIVE NEGATIVE Final    Comment: (NOTE) Fact Sheet for Patients: EntrepreneurPulse.com.au  Fact Sheet for Healthcare Providers: IncredibleEmployment.be  This test is not yet approved or cleared by the [place] FDA and has been authorized for detection and/or diagnosis of SARS-CoV-2 by FDA under an Emergency Use Authorization (EUA). This EUA will remain in effect (meaning this test can be used) for the duration of the COVID-19 declaration under Section 564(b)(1) of the Act, 21 U.S.C. section 360bbb-3(b)(1), unless the authorization is terminated or revoked.  Performed at [facility], [address], [place], [place] [address]     Studies: No results found.  Scheduled Meds:  allopurinol  50 mg Oral Q48H   arformoterol  15 mcg Nebulization Q12H   aspirin EC  81 mg Oral Daily   atorvastatin  40 mg Oral QHS   clopidogrel  75 mg Oral Daily   diclofenac Sodium  2 g Topical BID   enoxaparin (LOVENOX) injection  30 mg Subcutaneous Q24H   furosemide  60 mg Intravenous Q12H   gabapentin  300 mg Oral BID   insulin aspart  0-5 Units Subcutaneous QHS   insulin aspart  0-9 Units Subcutaneous TID WC   insulin glargine-yfgn  20 Units Subcutaneous QHS   isosorbide mononitrate  30 mg Oral Daily   levofloxacin  500 mg Oral Q48H   levothyroxine  100 mcg Oral q morning   umeclidinium-vilanterol  1 puff Inhalation Daily    Continuous Infusions:   LOS: 5 days   Time spent: 31 minutes  Huck Ashworth Marry Guan, MD [facility] Pager [phone]  If 7PM-7AM, please contact night-coverage [email or web address] Password [facility] 08/16/2022, 8:15 AM

## 2022-08-16 NOTE — Discharge Summary (Signed)
Discharge Summary  Jacob Silva GNF:[id number] DOB: 11-20-1931  PCP: [name], MD  Admit date: 08/11/2022 Discharge date: 08/16/2022  Recommendations for Outpatient Follow-up:  Please follow up with your PCP with CBC and BMP in 1-2 weeks Please follow-up with Dr. Daniel Nones of heart failure cardiology within the next 3 weeks, contact information is below.  Please call to schedule appointment. Please contact palliative care medicine as mentioned below, for an outpatient consultation.  Discharge Diagnoses:  Active Hospital Problems   Diagnosis Date Noted   Acute on chronic diastolic CHF (congestive heart failure) ([place]) 08/11/2022    Priority: High   Biventricular heart failure ([place]) 02/14/2022    Priority: 1.   CAP (community acquired pneumonia) 08/12/2022    Priority: 2.   COPD (chronic obstructive pulmonary disease) ([place]) 08/11/2022    Priority: 3.   Myocardial injury 08/11/2022    Priority: 4.   CAD (coronary artery disease) 11/14/2013    Priority: 4.   HLD (hyperlipidemia) 08/11/2022    Priority: 5.   CKD (chronic kidney disease), stage IV ([place]) 08/11/2022    Priority: 6.   Hypothyroidism 11/14/2013    Priority: 7.   Type II diabetes mellitus with renal manifestations ([place]) 08/11/2022    Priority: 8.   Gout 11/14/2013    Priority: 9.   Chronic hypoxic respiratory failure ([place]) 08/14/2022    Resolved Hospital Problems  No resolved problems to display.   Discharge Condition: Stable   Diet recommendation: Diet Orders (From admission, onward)     Start     Ordered   08/12/22 0938  Diet 2 gram sodium Fluid consistency: Thin; Fluid restriction: 1500 mL Fluid  Diet effective now       Question Answer Comment  Fluid consistency: Thin   Fluid restriction: 1500 mL Fluid      08/12/22 0937           HPI and Brief Hospital Course:  This is a pleasant 89 year old gentleman with a history of hypertension, hyperlipidemia, non-insulin-dependent type 2  diabetes, COPD chronically on 2 to 3 L nasal cannula oxygen, CAD, diastolic congestive heart failure and 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  He was admitted to the hospitalist service, at the time of admission he had 2+ leg edema, positive JVD with crackles on auscultation of his lungs.  BNP was elevated at 884.  He was admitted to the hospitalist service and started on IV Lasix 60 mg twice daily, he has responded well to this and is now euvolemic.  In the meantime, he completed a course of IV Levaquin for community-acquired pneumonia, and is back to his baseline oxygen requirement.  Due to his biventricular heart failure, he was seen by the heart failure cardiology team today prior to discharge from the hospital, and will follow-up closely with them as an outpatient.  Due to chronicity and difficulty managing his chronic conditions, family has requested palliative care consultation, they have been provided contact information to set up outpatient consultation.  Procedures: None  Consultations: Heart failure cardiology  Discharge details, plan of care and follow up instructions were discussed with patient and any available family or care providers. Patient and family are in agreement with discharge from the hospital today and all questions were answered to their satisfaction.  Discharge Exam: BP 114/70 (BP Location: Right Arm)   Pulse 83   Temp (!) 97.5 F (  36.4 C) (Oral)   Resp 18   Ht 5\' 11"  (1.803 m)   Wt 91.5 kg   SpO2 96%   BMI 28.13 kg/m  General:  Alert, oriented, calm, in no acute distress, wearing 2 L nasal cannula oxygen, looks euvolemic on exam today, wife is at the bedside Eyes: EOMI, clear sclerea Neck: supple, no masses, trachea mildline  Cardiovascular: RRR, no murmurs or rubs, no peripheral edema  Respiratory: clear to auscultation  bilaterally, no wheezes, no crackles  Abdomen: soft, nontender, nondistended, normal bowel tones heard  Skin: dry, no rashes  Musculoskeletal: no joint effusions, normal range of motion  Psychiatric: appropriate affect, normal speech  Neurologic: extraocular muscles intact, clear speech, moving all extremities with intact sensorium   Discharge Instructions You were cared for by a hospitalist during your hospital stay. If you have any questions about your discharge medications or the care you received while you were in the hospital after you are discharged, you can call the unit and asked to speak with the hospitalist on call if the hospitalist that took care of you is not available. Once you are discharged, your primary care physician will handle any further medical issues. Please note that NO REFILLS for any discharge medications will be authorized once you are discharged, as it is imperative that you return to your primary care physician (or establish a relationship with a primary care physician if you do not have one) for your aftercare needs so that they can reassess your need for medications and monitor your lab values.   Allergies as of 08/16/2022       Reactions   Jardiance [empagliflozin]    Penicillin G Other (See Comments)   Penicillins Swelling   Tramadol    Other reaction(s): Dizziness        Medication List     TAKE these medications    acetaminophen 325 MG tablet Commonly known as: TYLENOL Take 650 mg by mouth every 6 (six) hours as needed for mild pain.   albuterol 108 (90 Base) MCG/ACT inhaler Commonly known as: VENTOLIN HFA Inhale 1 puff into the lungs every 6 (six) hours as needed for wheezing or shortness of breath.   allopurinol 100 MG tablet Commonly known as: ZYLOPRIM Take 0.5 tablets (50 mg total) by mouth every other day. Start taking on: August 17, 2022 What changed:  how much to take when to take this   Anoro Ellipta 62.5-25 MCG/ACT Aepb Generic  drug: umeclidinium-vilanterol Inhale 1 puff into the lungs daily.   aspirin EC 81 MG tablet Take 81 mg by mouth daily.   atorvastatin 80 MG tablet Commonly known as: LIPITOR Take 40 mg by mouth at bedtime.   clopidogrel 75 MG tablet Commonly known as: PLAVIX Take 75 mg by mouth daily.   diclofenac Sodium 1 % Gel Commonly known as: VOLTAREN Apply 2 g topically 4 (four) times daily as needed (pain).   famotidine 20 MG tablet Commonly known as: PEPCID Take 1 tablet (20 mg total) by mouth daily as needed for heartburn or indigestion.   Farxiga 10 MG Tabs tablet Generic drug: dapagliflozin propanediol Take 10 mg by mouth daily.   gabapentin 300 MG capsule Commonly known as: NEURONTIN Take 1 capsule (300 mg total) by mouth 2 (two) times daily. What changed:  medication strength how much to take   isosorbide mononitrate 30 MG 24 hr tablet Commonly known as: IMDUR Take 30 mg by mouth daily.   Lantus SoloStar 100 UNIT/ML Solostar Pen  Generic drug: insulin glargine Inject 20 Units into the skin at bedtime. What changed: how much to take   levothyroxine 100 MCG tablet Commonly known as: SYNTHROID Take 100 mcg by mouth every morning.   loperamide 2 MG capsule Commonly known as: IMODIUM Take 2 mg by mouth daily as needed for diarrhea or loose stools.   loratadine 10 MG tablet Commonly known as: CLARITIN Take 10 mg by mouth daily as needed for allergies.   metolazone 2.5 MG tablet Commonly known as: ZAROXOLYN Take 1 tablet (2.5 mg total) by mouth daily as needed (Weight gain or leg edema).   Perforomist 20 MCG/2ML nebulizer solution Generic drug: formoterol Take 20 mcg by nebulization 2 (two) times daily.   Systane Balance 0.6 % Soln Generic drug: Propylene Glycol Place 1 drop into both eyes daily as needed (dry eyes).   torsemide 20 MG tablet Commonly known as: DEMADEX Take 3 tablets (60 mg total) by mouth daily. What changed:  medication strength how much to  take Another medication with the same name was removed. Continue taking this medication, and follow the directions you see here.       Allergies  Allergen Reactions   Jardiance [Empagliflozin]    Penicillin G Other (See Comments)   Penicillins Swelling   Tramadol     Other reaction(s): Dizziness    Follow-up Information     [name], MD Follow up in 2 week(s).   Specialty: Internal Medicine Contact information: [place] [address] [phone]         Hebert Soho, DO Follow up in 3 week(s).   Specialty: Cardiology Contact information: [place] [address] [phone]         [facility] Schedule an appointment as soon as possible for a visit in 1 week(s).   Specialty: Hospice and Palliative Medicine Contact information: [place] [address] [phone]                 The results of significant diagnostics from this hospitalization (including imaging, microbiology, ancillary and laboratory) are listed below for reference.    Significant Diagnostic Studies: DG Chest 2 View  Result Date: 08/11/2022 CLINICAL DATA:  chest pain EXAM: CHEST - 2 VIEW COMPARISON:  June 11, 2022, February 14, 2022, January 13, 2022 FINDINGS: The cardiomediastinal silhouette is unchanged in contour.Status post median sternotomy and CABG. No pleural effusion. No pneumothorax. Similar appearance of coarse bilateral reticular opacities. Similar appearance of RIGHT lateral pleural thickening. There are multiple more focal areas of confluent opacity which appear mildly increased in comparison to more remote priors from 2023, particularly in the LEFT lung base and RIGHT upper lung. Visualized abdomen is unremarkable. Multilevel degenerative changes of the thoracic spine. IMPRESSION: Mildly increased confluence of heterogeneous opacities superimposed on a background of coarse bilateral reticular  opacities. Findings may reflect superimposed infection, aspiration, progressive fibrosis or atelectasis on a background of pulmonary fibrosis. Consider follow-up PA and lateral chest radiograph in 4 weeks to assess for resolution. Electronically Signed   By: Valentino Saxon M.D.   On: 08/11/2022 12:05    Microbiology: Recent Results (from the past 240 hour(s))  Blood culture (routine x 2)     Status: None   Collection Time: 08/11/22  2:49 PM   Specimen: BLOOD RIGHT ARM  Result Value Ref Range Status   Specimen Description BLOOD RIGHT ARM  Final   Special Requests   Final  BOTTLES DRAWN AEROBIC AND ANAEROBIC Blood Culture adequate volume   Culture   Final    NO GROWTH 5 DAYS Performed at [facility], [address] [place], [place] [address]    Report Status 08/16/2022 FINAL  Final  Blood culture (routine x 2)     Status: None   Collection Time: 08/11/22  2:54 PM   Specimen: Right Antecubital; Blood  Result Value Ref Range Status   Specimen Description RIGHT ANTECUBITAL  Final   Special Requests   Final    BOTTLES DRAWN AEROBIC AND ANAEROBIC Blood Culture adequate volume   Culture   Final    NO GROWTH 5 DAYS Performed at [facility], [address], [place], [place] [address]    Report Status 08/16/2022 FINAL  Final  Resp panel by RT-PCR (RSV, Flu A&B, Covid) Anterior Nasal Swab     Status: None   Collection Time: 08/11/22  4:46 PM   Specimen: Anterior Nasal Swab  Result Value Ref Range Status   SARS Coronavirus 2 by RT PCR NEGATIVE NEGATIVE Final    Comment: (NOTE) SARS-CoV-2 target nucleic acids are NOT DETECTED.  The SARS-CoV-2 RNA is generally detectable in upper respiratory specimens during the acute phase of infection. The lowest concentration of SARS-CoV-2 viral copies this assay can detect is 138 copies/mL. A negative result does not preclude SARS-Cov-2 infection and should not be used as the sole basis for treatment or other patient  management decisions. A negative result may occur with  improper specimen collection/handling, submission of specimen other than nasopharyngeal swab, presence of viral mutation(s) within the areas targeted by this assay, and inadequate number of viral copies(<138 copies/mL). A negative result must be combined with clinical observations, patient history, and epidemiological information. The expected result is Negative.  Fact Sheet for Patients:  EntrepreneurPulse.com.au  Fact Sheet for Healthcare Providers:  IncredibleEmployment.be  This test is no t yet approved or cleared by the [place] FDA and  has been authorized for detection and/or diagnosis of SARS-CoV-2 by FDA under an Emergency Use Authorization (EUA). This EUA will remain  in effect (meaning this test can be used) for the duration of the COVID-19 declaration under Section 564(b)(1) of the Act, 21 U.S.C.section 360bbb-3(b)(1), unless the authorization is terminated  or revoked sooner.       Influenza A by PCR NEGATIVE NEGATIVE Final   Influenza B by PCR NEGATIVE NEGATIVE Final    Comment: (NOTE) The Xpert Xpress SARS-CoV-2/FLU/RSV plus assay is intended as an aid in the diagnosis of influenza from Nasopharyngeal swab specimens and should not be used as a sole basis for treatment. Nasal washings and aspirates are unacceptable for Xpert Xpress SARS-CoV-2/FLU/RSV testing.  Fact Sheet for Patients: EntrepreneurPulse.com.au  Fact Sheet for Healthcare Providers: IncredibleEmployment.be  This test is not yet approved or cleared by the [place] FDA and has been authorized for detection and/or diagnosis of SARS-CoV-2 by FDA under an Emergency Use Authorization (EUA). This EUA will remain in effect (meaning this test can be used) for the duration of the COVID-19 declaration under Section 564(b)(1) of the Act, 21 U.S.C. section 360bbb-3(b)(1),  unless the authorization is terminated or revoked.     Resp Syncytial Virus by PCR NEGATIVE NEGATIVE Final    Comment: (NOTE) Fact Sheet for Patients: EntrepreneurPulse.com.au  Fact Sheet for Healthcare Providers: IncredibleEmployment.be  This test is not yet approved or cleared by the [place] FDA and has been authorized for detection and/or diagnosis of SARS-CoV-2 by FDA  under an Emergency Use Authorization (EUA). This EUA will remain in effect (meaning this test can be used) for the duration of the COVID-19 declaration under Section 564(b)(1) of the Act, 21 U.S.C. section 360bbb-3(b)(1), unless the authorization is terminated or revoked.  Performed at [facility], [address] [place], [place] [address]      Labs: Basic Metabolic Panel: Recent Labs  Lab 08/11/22 1331 08/12/22 0414 08/12/22 0917 08/13/22 0530 08/14/22 0805 08/15/22 0205 08/16/22 0614  NA  --    < > 139 141 141 142 140  K  --    < > 3.5 3.7 3.6 3.4* 3.9  CL  --    < > 99 101 101 102 103  CO2  --    < > 28 30 32 32 30  GLUCOSE  --    < > 131* 84 104* 106* 93  BUN  --    < > 32* 33* 30* 32* 31*  CREATININE  --    < > 2.45* 2.44* 2.43* 2.38* 2.49*  CALCIUM  --    < > 8.7* 8.6* 8.9 8.7* 8.7*  MG 2.4  --   --   --   --   --   --    < > = values in this interval not displayed.   Liver Function Tests: No results for input(s): "AST", "ALT", "ALKPHOS", "BILITOT", "PROT", "ALBUMIN" in the last 168 hours. No results for input(s): "LIPASE", "AMYLASE" in the last 168 hours. No results for input(s): "AMMONIA" in the last 168 hours. CBC: Recent Labs  Lab 08/11/22 1133 08/12/22 0414 08/15/22 0205 08/16/22 0614  WBC 11.4* 10.4 10.5 9.5  HGB 12.0* 11.1* 10.5* 10.8*  HCT 37.6* 34.9* 32.8* 34.5*  MCV 76.3* 76.2* 76.3* 76.0*  PLT 194 177 172 170   Cardiac Enzymes: No results for input(s): "CKTOTAL", "CKMB", "CKMBINDEX", "TROPONINI" in the last 168  hours. BNP: BNP (last 3 results) Recent Labs    02/14/22 1309 06/11/22 1054 08/11/22 1133  BNP 472.4* 490.1* 884.1*    ProBNP (last 3 results) No results for input(s): "PROBNP" in the last 8760 hours.  CBG: Recent Labs  Lab 08/15/22 1155 08/15/22 1638 08/15/22 2137 08/16/22 0812 08/16/22 1147  GLUCAP 95 105* 123* 93 178*    Time spent: > 30 minutes were spent in preparing this discharge including medication reconciliation, counseling, and coordination of care.  Signed:  Fabianna Keats Marry Guan, MD  [facility] 08/16/2022, 1:28 PM

## 2022-08-16 NOTE — Progress Notes (Signed)
Jacob Silva to be D/C'd Home per MD order.  Discussed prescriptions and follow up appointments with the patient. Prescriptions given to patient, medication list explained in detail. Pt verbalized understanding.  Allergies as of 08/16/2022       Reactions   Jardiance [empagliflozin]    Penicillin G Other (See Comments)   Penicillins Swelling   Tramadol    Other reaction(s): Dizziness        Medication List     TAKE these medications    acetaminophen 325 MG tablet Commonly known as: TYLENOL Take 650 mg by mouth every 6 (six) hours as needed for mild pain.   albuterol 108 (90 Base) MCG/ACT inhaler Commonly known as: VENTOLIN HFA Inhale 1 puff into the lungs every 6 (six) hours as needed for wheezing or shortness of breath.   allopurinol 100 MG tablet Commonly known as: ZYLOPRIM Take 0.5 tablets (50 mg total) by mouth every other day. Start taking on: August 17, 2022 What changed:  how much to take when to take this   Anoro Ellipta 62.5-25 MCG/ACT Aepb Generic drug: umeclidinium-vilanterol Inhale 1 puff into the lungs daily.   aspirin EC 81 MG tablet Take 81 mg by mouth daily.   atorvastatin 80 MG tablet Commonly known as: LIPITOR Take 40 mg by mouth at bedtime.   clopidogrel 75 MG tablet Commonly known as: PLAVIX Take 75 mg by mouth daily.   diclofenac Sodium 1 % Gel Commonly known as: VOLTAREN Apply 2 g topically 4 (four) times daily as needed (pain).   famotidine 20 MG tablet Commonly known as: PEPCID Take 1 tablet (20 mg total) by mouth daily as needed for heartburn or indigestion.   Farxiga 10 MG Tabs tablet Generic drug: dapagliflozin propanediol Take 10 mg by mouth daily.   gabapentin 300 MG capsule Commonly known as: NEURONTIN Take 1 capsule (300 mg total) by mouth 2 (two) times daily. What changed:  medication strength how much to take   isosorbide mononitrate 30 MG 24 hr tablet Commonly known as: IMDUR Take 30 mg by mouth daily.    Lantus SoloStar 100 UNIT/ML Solostar Pen Generic drug: insulin glargine Inject 20 Units into the skin at bedtime. What changed: how much to take   levothyroxine 100 MCG tablet Commonly known as: SYNTHROID Take 100 mcg by mouth every morning.   loperamide 2 MG capsule Commonly known as: IMODIUM Take 2 mg by mouth daily as needed for diarrhea or loose stools.   loratadine 10 MG tablet Commonly known as: CLARITIN Take 10 mg by mouth daily as needed for allergies.   metolazone 2.5 MG tablet Commonly known as: ZAROXOLYN Take 1 tablet (2.5 mg total) by mouth daily as needed (Weight gain or leg edema).   Perforomist 20 MCG/2ML nebulizer solution Generic drug: formoterol Take 20 mcg by nebulization 2 (two) times daily.   Systane Balance 0.6 % Soln Generic drug: Propylene Glycol Place 1 drop into both eyes daily as needed (dry eyes).   torsemide 20 MG tablet Commonly known as: DEMADEX Take 3 tablets (60 mg total) by mouth daily. What changed:  medication strength how much to take Another medication with the same name was removed. Continue taking this medication, and follow the directions you see here.        Vitals:   08/16/22 1207 08/16/22 1509  BP: 114/70 113/64  Pulse: 83 76  Resp: 18 20  Temp: (!) 97.5 F (36.4 C) 98.2 F (36.8 C)  SpO2: 96% 97%  Tele box removed and returned. Skin clean, dry and intact without evidence of skin break down, no evidence of skin tears noted. IV catheter discontinued intact. Site without signs and symptoms of complications. Dressing and pressure applied. Pt denies pain at this time. No complaints noted.  An After Visit Summary was printed and given to the patient. Patient escorted via [place], and D/C home via private auto.  [name]

## 2022-08-16 NOTE — TOC Progression Note (Addendum)
Transition of Care [facility]) - Progression Note    Patient Details  Name: Jacob Silva MRN: [id number] Date of Birth: 1931-11-19  Transition of Care [facility]) CM/SW [place], [place] Phone Number: 08/16/2022, 10:20 AM  Clinical Narrative:     Update: CSW has informed [facility] of dc today. HH orders are in, no further needs.    TOC continues to follow for discharge planning needs.   Patient admitted to the hospital with Acute on chronic diastolic CHF.  Patient is from home with his wife and walks with a walker.  Chronic oxygen with [employer].  Patient is currently open with [facility]. Will need orders for [facility] PT and RN at time of discharge.     Expected Discharge Plan: [place] Barriers to Discharge: Continued Medical Work up  Expected Discharge Plan and Services   Discharge Planning Services: CM Consult Post Acute Care Choice: Home Health, Resumption of Svcs/PTA Provider Living arrangements for the past 2 months: Single Family Home                 DME Arranged: N/A DME Agency: NA       HH Arranged: RN, PT [place] Agency: [place] ([facility]) Date HH Agency Contacted: 08/12/22 Time [place]: 1433 Representative spoke with at [place]: [place] ([place]) Interventions SDOH Screenings   Food Insecurity: No Food Insecurity (08/12/2022)  Housing: Low Risk  (08/12/2022)  Transportation Needs: No Transportation Needs (08/12/2022)  Utilities: Not At Risk (08/12/2022)  Alcohol Screen: Low Risk  (02/19/2022)  Financial Resource Strain: Low Risk  (02/19/2022)  Tobacco Use: Medium Risk (08/12/2022)    Readmission Risk Interventions     No data to display

## 2022-08-16 NOTE — Consult Note (Signed)
ADVANCED HEART FAILURE CONSULT NOTE  Referring Physician: No ref. provider found  Primary Care: [name], MD Primary Cardiologist: Pierre Bali   HPI: Jacob Silva is a 89 y.o. male with CKD stage III-4, CAD status post CABG, cor pulmonale, pulmonary HTN, pulmonary fibrosis, chronic respiratory failure on home O2, COPD, Type 2 diabetes and OSA currently admitted for acute on chronic heart failure exacerbation secondary to RV failure. Jacob Silva has had two admissions over the past several months with similar complaints. He is followed outpatient by Dr. Haroldine Laws for pulmonary hypertension and RV failure. At baseline, Jacob Silva is on home O2 with RV failure/PH primarily managed by diuretics for symptom control. He has advanced CKD and is 89 years old barring him from aggressive treatment of pulmonary hypertension with pulmonary vasodilators.  Prior to admission, Jacob Silva reports taking torsemide 40mg  every morning prior to his admission. Despite this he became increasingly SOB with significant progression of LE edema and abdominal fullness. On the day of admission he reports waking up with conversational dyspnea and chest pain. He was appropriately diuresed by the hospitalist team and today feels at his baseline.     Past Medical History:  Diagnosis Date   CAD (coronary artery disease)    a. 2013 s/p CABG x 3 (LIMA->dLAD, VG->RPDA, VG->OM1); b. 12/2021 NSTEMI (peak HsTrop 641)-->Med rx in setting of CKD IV. EF 55-60% w/ rwma by echo.   Chronic heart failure with preserved ejection fraction (HFpEF) ([place])    a. 12/2021 Echo: EF 55-60%, no rwma, sev RV dysfxn w/ RVSP 69.4mmHg. Sev dil RA. Triv MR. Ao sclerosis.   CKD (chronic kidney disease), stage IV (HCC)    COPD (chronic obstructive pulmonary disease) ([place])    a. Home O2.   Diabetes ([place])    Essential hypertension    Hyperlipidemia LDL goal <70    NSTEMI (non-ST elevated myocardial infarction) ([place]) 01/13/2022   OSA  (obstructive sleep apnea)    Pulmonary fibrosis (HCC)     Current Facility-Administered Medications  Medication Dose Route Frequency Provider Last Rate Last Admin   acetaminophen (TYLENOL) tablet 650 mg  650 mg Oral Q4H PRN Mansy, Jan A, MD   650 mg at 08/15/22 2157   albuterol (PROVENTIL) (2.5 MG/3ML) 0.083% nebulizer solution 2.5 mg  2.5 mg Nebulization Q4H PRN Ivor Costa, MD       allopurinol (ZYLOPRIM) tablet 50 mg  50 mg Oral Q48H Ivor Costa, MD   50 mg at 08/15/22 1750   arformoterol (BROVANA) nebulizer solution 15 mcg  15 mcg Nebulization Q12H Ivor Costa, MD   15 mcg at 08/16/22 7673   aspirin EC tablet 81 mg  81 mg Oral Daily Ivor Costa, MD   81 mg at 08/16/22 0904   atorvastatin (LIPITOR) tablet 40 mg  40 mg Oral QHS Ivor Costa, MD   40 mg at 08/15/22 2150   clopidogrel (PLAVIX) tablet 75 mg  75 mg Oral Daily Ivor Costa, MD   75 mg at 08/16/22 4193   dextromethorphan-guaiFENesin (MUCINEX DM) 30-600 MG per 12 hr tablet 1 tablet  1 tablet Oral BID PRN Ivor Costa, MD       diclofenac Sodium (VOLTAREN) 1 % topical gel 2 g  2 g Topical BID Hollice Gong, Mir Mohammed, MD   2 g at 08/15/22 2151   enoxaparin (LOVENOX) injection 30 mg  30 mg Subcutaneous Q24H Ivor Costa, MD   30 mg at 08/15/22 2150   famotidine (PEPCID) tablet 10 mg  10 mg Oral Daily PRN Ivor Costa, MD       furosemide (LASIX) injection 60 mg  60 mg Intravenous Q12H Ivor Costa, MD   60 mg at 08/16/22 0630   gabapentin (NEURONTIN) capsule 300 mg  300 mg Oral BID Ivor Costa, MD   300 mg at 08/16/22 1601   insulin aspart (novoLOG) injection 0-5 Units  0-5 Units Subcutaneous QHS Ivor Costa, MD   0 Units at 08/11/22 2129   insulin aspart (novoLOG) injection 0-9 Units  0-9 Units Subcutaneous TID WC Ivor Costa, MD   1 Units at 08/14/22 1158   insulin glargine-yfgn (SEMGLEE) injection 20 Units  20 Units Subcutaneous QHS Ivor Costa, MD   20 Units at 08/15/22 2150   isosorbide mononitrate (IMDUR) 24 hr tablet 30 mg  30 mg Oral Daily Ivor Costa, MD   30 mg at 08/16/22 0932   levofloxacin (LEVAQUIN) tablet 500 mg  500 mg Oral Q48H Hollice Gong, Mir Mohammed, MD   500 mg at 08/14/22 2157   levothyroxine (SYNTHROID) tablet 100 mcg  100 mcg Oral q morning Ivor Costa, MD   100 mcg at 08/16/22 3557   loperamide (IMODIUM) capsule 2 mg  2 mg Oral Daily PRN Ivor Costa, MD       loratadine (CLARITIN) tablet 10 mg  10 mg Oral Daily PRN Ivor Costa, MD       Oral care mouth rinse  15 mL Mouth Rinse PRN Ivor Costa, MD       polyvinyl alcohol (LIQUIFILM TEARS) 1.4 % ophthalmic solution 1 drop  1 drop Both Eyes Daily PRN Ivor Costa, MD   1 drop at 08/14/22 0025   umeclidinium-vilanterol (ANORO ELLIPTA) 62.5-25 MCG/ACT 1 puff  1 puff Inhalation Daily Ivor Costa, MD   1 puff at 08/16/22 0909    Allergies  Allergen Reactions   Jardiance [Empagliflozin]    Penicillin G Other (See Comments)   Penicillins Swelling   Tramadol     Other reaction(s): Dizziness      Social History   Socioeconomic History   Marital status: Married    Spouse name: Tye [place]   Number of children: 3   Years of education: Not on file   Highest education level: Doctorate  Occupational History   Occupation: Retired    Comment: Nature conservation officer  Tobacco Use   Smoking status: Former    Types: Cigarettes    Quit date: 1975    Years since quitting: 49.0   Smokeless tobacco: Never  Vaping Use   Vaping Use: Never used  Substance and Sexual Activity   Alcohol use: No   Drug use: No   Sexual activity: Not on file  Other Topics Concern   Not on file  Social History Narrative   Lives in [place] with his wife.  Does not routinely exercise.   Social Determinants of Health   Financial Resource Strain: Low Risk  (02/19/2022)   Overall Financial Resource Strain (CARDIA)    Difficulty of Paying Living Expenses: Not very hard  Food Insecurity: No Food Insecurity (08/12/2022)   Hunger Vital Sign    Worried About Running Out of Food in the Last Year: Never true    Ran Out of  Food in the Last Year: Never true  Transportation Needs: No Transportation Needs (08/12/2022)   PRAPARE - Hydrologist (Medical): No    Lack of Transportation (Non-Medical): No  Physical Activity: Not on file  Stress: Not on file  Social  Connections: Not on file  Intimate Partner Violence: Not At Risk (08/12/2022)   Humiliation, Afraid, Rape, and Kick questionnaire    Fear of Current or Ex-Partner: No    Emotionally Abused: No    Physically Abused: No    Sexually Abused: No      Family History  Problem Relation Age of Onset   Diabetes Mother    Kidney disease Mother    Heart attack Brother    Throat cancer Brother     PHYSICAL EXAM: Vitals:   08/16/22 0738 08/16/22 0824  BP:  114/77  Pulse:  77  Resp:  20  Temp:  98.3 F (36.8 C)  SpO2: 97% 100%   GENERAL: elderly AAM, NAD on 2-3LNC HEENT: Negative for arcus senilis or xanthelasma. There is no scleral icterus.  The mucous membranes are pink and moist.   NECK: Supple, No masses. Normal carotid upstrokes without bruits. No masses or thyromegaly.    CHEST: There are no chest wall deformities. There is no chest wall tenderness. Respirations are unlabored.  Lungs- decreased lung sounds b/l CARDIAC:  JVP: 10 cm H2O         Normal S1, S2  Normal rate with regular rhythm. No murmurs, rubs or gallops.  Pulses are 2+ and symmetrical in upper and lower extremities. no edema.  ABDOMEN: Soft, non-tender, non-distended. There are no masses or hepatomegaly. There are normal bowel sounds.  EXTREMITIES: Warm and well perfused with no cyanosis, clubbing.  LYMPHATIC: No axillary or supraclavicular lymphadenopathy.  NEUROLOGIC: Patient is oriented x3 with no focal or lateralizing neurologic deficits.  PSYCH: Patients affect is appropriate, there is no evidence of anxiety or depression.  SKIN: Warm and dry; no lesions or wounds.   DATA REVIEW  OMB:TDHRCB fibrillation with frequent PVCs  ECHO: 6/23: LVEF  55%-60%, severely dilated RV with severely reduced function. Severe TR.   ASSESSMENT & PLAN:  Acute on Chronic RV failure / Cor pulmonale from pulmonary HTN - Not a candidate for advanced therapies due to advanced age, frailty & CKD - Euvolemic on exam today - Discharge home on torsemide 60mg  daily + metolazone 2.5mg  PRN ONLY for excessive weight gain or LE edema.  - Palliative care consulted. Lengthy discussion with his wife today. His only options for the past several months have been symptom control with diuretics. His wife states she is having a very difficult time caring for him now and would like to have palliative on board. I am in agreement with her. Discussed volume management at length with extra torsemide and/or metolazone if needed.  - Follow up in 1 week in HF clinic.   2. CKD IV - Stable sCr  3. COPD & pulmonary fibrosis - Stable O2 sats - followed by pulmonology   HF MEDS AT DISCHARGE: Torsemide 60mg  daily Metolazone 2.5mg  PRN for volume overload Jardiance 10mg  daily  Karam Dunson Advanced Heart Failure Mechanical Circulatory Support

## 2022-08-19 ENCOUNTER — Encounter: Payer: Medicare Other | Admitting: Family

## 2022-08-20 ENCOUNTER — Telehealth: Payer: Self-pay

## 2022-08-22 ENCOUNTER — Other Ambulatory Visit
Admission: RE | Admit: 2022-08-22 | Discharge: 2022-08-22 | Disposition: A | Discharge status: Home / Self Care | Payer: Medicare Other | Source: Ambulatory Visit | Attending: Cardiology | Admitting: Cardiology

## 2022-08-22 ENCOUNTER — Ambulatory Visit (HOSPITAL_BASED_OUTPATIENT_CLINIC_OR_DEPARTMENT_OTHER): Payer: Medicare Other | Admitting: Cardiology

## 2022-08-22 ENCOUNTER — Encounter: Payer: Self-pay | Admitting: Cardiology

## 2022-08-22 VITALS — BP 110/58 | HR 66 | Ht 71.0 in | Wt 218.0 lb

## 2022-08-22 DIAGNOSIS — I5082 Biventricular heart failure: Secondary | ICD-10-CM | POA: Diagnosis present

## 2022-08-22 DIAGNOSIS — I272 Pulmonary hypertension, unspecified: Secondary | ICD-10-CM | POA: Diagnosis not present

## 2022-08-22 DIAGNOSIS — I5020 Unspecified systolic (congestive) heart failure: Secondary | ICD-10-CM

## 2022-08-22 LAB — BASIC METABOLIC PANEL
Anion gap: 9 (ref 5–15)
BUN: 40 mg/dL — ABNORMAL HIGH (ref 8–23)
CO2: 30 mmol/L (ref 22–32)
Calcium: 8.8 mg/dL — ABNORMAL LOW (ref 8.9–10.3)
Chloride: 101 mmol/L (ref 98–111)
Creatinine, Ser: 2.63 mg/dL — ABNORMAL HIGH (ref 0.61–1.24)
GFR, Estimated: 23 mL/min — ABNORMAL LOW (ref >60)
Glucose, Bld: 69 mg/dL — ABNORMAL LOW (ref 70–99)
Potassium: 4 mmol/L (ref 3.5–5.1)
Sodium: 140 mmol/L (ref 135–145)

## 2022-08-22 LAB — LIPID PANEL
Cholesterol: 76 mg/dL (ref 0–200)
HDL: 40 mg/dL — ABNORMAL LOW (ref >40)
LDL Cholesterol: 27 mg/dL (ref 0–99)
Total CHOL/HDL Ratio: 1.9 RATIO
Triglycerides: 44 mg/dL (ref <150)
VLDL: 9 mg/dL (ref 0–40)

## 2022-08-22 LAB — BRAIN NATRIURETIC PEPTIDE: B Natriuretic Peptide: 683.2 pg/mL — ABNORMAL HIGH (ref 0.0–100.0)

## 2022-08-22 MED ORDER — POTASSIUM CHLORIDE CRYS ER 20 MEQ PO TBCR: 20.0000 meq | EXTENDED_RELEASE_TABLET | Freq: Every day | ORAL | 3 refills | Status: DC

## 2022-08-22 MED ORDER — TORSEMIDE 20 MG PO TABS: 60.0000 mg | ORAL_TABLET | Freq: Two times a day (BID) | ORAL | 3 refills | Status: DC

## 2022-08-22 NOTE — Patient Instructions (Addendum)
Medication Changes:  INCREASE torsemide: 60 mg (3 tablets) in the AM, and 40 mg tablets in the PM  BEGIN taking potassium 20 meq tablet once daily  Lab Work:  Labs done today, your results will be available in MyChart, we will contact you for abnormal readings.   Return to the [place] [facility] one day next week to have labs redrawn  Testing/Procedures:  None today  Referrals:  None  Special Instructions // Education:  Do the following things EVERYDAY: Weigh yourself in the morning before breakfast. Write it down and keep it in a log. Take your medicines as prescribed Eat low salt foods--Limit salt (sodium) to 2000 mg per day.  Stay as active as you can everyday Limit all fluids for the day to less than 2 liters   Follow-Up in: 2 weeks with Jacob Price, FNP    If you have any questions or concerns before your next appointment please send [place] a message through [facility] or call our office at [phone] Monday-Friday 8 am-5 pm.   If you have an urgent need after hours on the weekend please call your Primary Cardiologist or the [facility] in [place] at [phone].

## 2022-08-22 NOTE — Progress Notes (Signed)
Referring Physician: Dr. Darrick Meigs  Primary Care: [name], MD Primary Cardiologist: Dr. Angelena Form  HF Cardiology: Dr. Aundra Dubin  HPI: Patient was referred to HF clinic from [facility] clinic for evaluation of RV failure.    Jacob Silva is a 89 y.o. male with CKD stage 4, CAD status post CABG, cor pulmonale, pulmonary HTN, pulmonary fibrosis, chronic respiratory failure on home O2, COPD, Type 2 diabetes and OSA on CPAP.  Echo in 6/23 showed normal LVEF 55-60%, RV severely enlarged w/ severely reduced systolic function and severely elevated RVSP, 69 mmHg, severe TR, RA severely dilated. Only trivial MR. He has primarily WHO Group 3 PH from underlying lung disease (COPD, Pulmonary Fibrosis and OSA). He was admitted on 02/14/2022 for acute on chronic diastolic heart failure.  He was diuresed w/ IV Lasix and transition to torsemide 40 mg daily. BP was too soft for GDMT. Coreg discontinued. Given advanced age, frailty and significant CKD Stage IV, he was felt not a candidate for advanced therapies of any sort. He was made DNR. D/c home on home O2 (2L/min). D/c Wt 222 lb.  Referred to [facility] clinic.   He was admitted again in 1/24 with CHF/cor pulmonale and diuresed.  ECGs from this admission were reviewed, he was noted to be in atrial fibrillation during that admission but has not been on anticoagulation.   Given his cardiac condition, continuous supplemental O2 requirements and fragility,  he requires 24 hr care from a in-home caregiver.   Patient returns for followup of RV failure.  He is short of breath walking around his house.  He continues to wear home oxygen.  Weight has been increasing steadily since he got home from the hospital after most recent admission. Weight up 7 lbs since last appointment.  He notes orthopnea.  No chest pain.  No lightheadedness.   Labs (1/24): K 3.9, creatinine 2.38 => 2.49  PMH: 1. CKD stage IV 2. CAD: s/p CABG in 2013 with LIMA-LAD, SVG-PDA, SVG-OM1.  - NSTEMI  6/23: Medical management.  3. Chronic hypoxemic respiratory failure: On home oxygen.  H/o pulmonary fibrosis and COPD.  He quit smoking years ago.  Has significant Agent Orange exposure.  4. Type 2 diabetes 5. Gout 6. OSA: CPAP 7. Chronic diastolic CHF/cor pulmonale: Echo (6/23) with EF 55-60%, severely decreased RV systolic function, severe RV enlargement, PASP 69 mmHg, severe TR.   Review of Systems: All systems reviewed and negative except as per HPI.   Current Outpatient Medications  Medication Sig Dispense Refill   acetaminophen (TYLENOL) 325 MG tablet Take 650 mg by mouth every 6 (six) hours as needed for mild pain.     albuterol (VENTOLIN HFA) 108 (90 Base) MCG/ACT inhaler Inhale 1 puff into the lungs every 6 (six) hours as needed for wheezing or shortness of breath.     allopurinol (ZYLOPRIM) 100 MG tablet Take 100 mg by mouth daily.     aspirin EC 81 MG tablet Take 81 mg by mouth daily.     atorvastatin (LIPITOR) 80 MG tablet Take 40 mg by mouth at bedtime.     clopidogrel (PLAVIX) 75 MG tablet Take 75 mg by mouth daily.     dapagliflozin propanediol (FARXIGA) 10 MG TABS tablet Take 10 mg by mouth daily.     diclofenac Sodium (VOLTAREN) 1 % GEL Apply 2 g topically 4 (four) times daily as needed (pain).     famotidine (PEPCID) 20 MG tablet Take 1 tablet (20 mg  total) by mouth daily as needed for heartburn or indigestion. 30 tablet 2   formoterol (PERFOROMIST) 20 MCG/2ML nebulizer solution Take 20 mcg by nebulization 2 (two) times daily.     gabapentin (NEURONTIN) 300 MG capsule Take 1 capsule (300 mg total) by mouth 2 (two) times daily. 60 capsule 0   isosorbide mononitrate (IMDUR) 30 MG 24 hr tablet Take 30 mg by mouth daily.     LANTUS SOLOSTAR 100 UNIT/ML Solostar Pen Inject 20 Units into the skin at bedtime. (Patient taking differently: Inject 35 Units into the skin at bedtime.) 15 mL 11   levothyroxine (SYNTHROID) 100 MCG tablet Take 100 mcg by mouth every morning.      loperamide (IMODIUM) 2 MG capsule Take 2 mg by mouth daily as needed for diarrhea or loose stools.     loratadine (CLARITIN) 10 MG tablet Take 10 mg by mouth daily as needed for allergies.     potassium chloride SA (KLOR-CON M) 20 MEQ tablet Take 1 tablet (20 mEq total) by mouth daily. 30 tablet 3   Propylene Glycol (SYSTANE BALANCE) 0.6 % SOLN Place 1 drop into both eyes daily as needed (dry eyes).     torsemide (DEMADEX) 20 MG tablet Take 3 tablets (60 mg total) by mouth 2 (two) times daily. Take 3 tablets (60 mg total) by mouth in the AM, and 2 tablets (40 mg total) in the PM 150 tablet 3   umeclidinium-vilanterol (ANORO ELLIPTA) 62.5-25 MCG/INH AEPB Inhale 1 puff into the lungs daily.     metolazone (ZAROXOLYN) 2.5 MG tablet Take 1 tablet (2.5 mg total) by mouth daily as needed (Weight gain or leg edema). (Patient not taking: Reported on 08/22/2022) 30 tablet 0   No current facility-administered medications for this visit.    Allergies  Allergen Reactions   Jardiance [Empagliflozin]    Penicillin G Other (See Comments)   Penicillins Swelling   Tramadol     Other reaction(s): Dizziness      Social History   Socioeconomic History   Marital status: Married    Spouse name: Tye [place]   Number of children: 3   Years of education: Not on file   Highest education level: Doctorate  Occupational History   Occupation: Retired    Comment: Nature conservation officer  Tobacco Use   Smoking status: Former    Types: Cigarettes    Quit date: 1975    Years since quitting: 49.0   Smokeless tobacco: Never  Vaping Use   Vaping Use: Never used  Substance and Sexual Activity   Alcohol use: No   Drug use: No   Sexual activity: Not on file  Other Topics Concern   Not on file  Social History Narrative   Lives in [place] with his wife.  Does not routinely exercise.   Social Determinants of Health   Financial Resource Strain: Low Risk  (02/19/2022)   Overall Financial Resource Strain (CARDIA)    Difficulty  of Paying Living Expenses: Not very hard  Food Insecurity: No Food Insecurity (08/12/2022)   Hunger Vital Sign    Worried About Running Out of Food in the Last Year: Never true    Ran Out of Food in the Last Year: Never true  Transportation Needs: No Transportation Needs (08/12/2022)   PRAPARE - Hydrologist (Medical): No    Lack of Transportation (Non-Medical): No  Physical Activity: Not on file  Stress: Not on file  Social Connections: Not on file  Intimate  Partner Violence: Not At Risk (08/12/2022)   Humiliation, Afraid, Rape, and Kick questionnaire    Fear of Current or Ex-Partner: No    Emotionally Abused: No    Physically Abused: No    Sexually Abused: No      Family History  Problem Relation Age of Onset   Diabetes Mother    Kidney disease Mother    Heart attack Brother    Throat cancer Brother     Vitals:   08/22/22 0952  BP: (!) 110/58  Pulse: 66  SpO2: 96%  Weight: 218 lb (98.9 kg)  Height: 5\' 11"  (1.803 m)    PHYSICAL EXAM: General: NAD Neck: JVP 12 cm, no thyromegaly or thyroid nodule.  Lungs: Crackles at bases.  CV: Nondisplaced PMI.  Heart regular S1/S2, no S3/S4, 2/6 HSM LLSB.  1+ edema to knees.  No carotid bruit.  Normal pedal pulses.  Abdomen: Soft, nontender, no hepatosplenomegaly, no distention.  Skin: Intact without lesions or rashes.  Neurologic: Alert and oriented x 3.  Psych: Normal affect. Extremities: No clubbing or cyanosis.  HEENT: Normal.   ASSESSMENT & PLAN: 1. Chronic RV failure with pulmonary hypertension: Cor pulmonale in setting of severe lung disease.  Echo (6/23) with EF 55-60%, severely decreased RV systolic function, severe RV enlargement, PASP 69 mmHg, severe TR. Suspect group 3 PH due to lung disease.  He is significantly volume overloaded on exam with NYHA class IIIb symptoms chronically.  This is complicated by CKD stage IV.   - Increase torsemide to 60 qam/40 qpm and add KCl 20 daily.  BMET/BNP  today, BMET in 1 week.  - Continue Farxiga 10 mg daily  2. Tricuspid regurgitation: Severe, functional due to RV dilation.  Not candidate for tTEER due to overall poor functional capacity and fragility.  3. CKD Stage IV  - Followed by nephrology  - On Farxiga.  - BMP today  4. COPD: Remote smoker.  He is on 2L home oxygen. He does not want to go to a pulmonologist.  5. Pulmonary fibrosis: He carries this diagnosis.  He has history of heavy Agent Orange exposure.  He does not want to see a pulmonologist or have further pulmonary workup.  - Continue home oxygen.  5. OSA: Continue CPAP.  6. CAD: s/p CABG in 2013.  No chest pain.  - Continue ASA 81 - Continue atorvastatin 80 mg daily, check lipids today.  7. Atrial fibrillation: Paroxysmal.  After patient left office today, I reviewed his prior ECGs.  He was actually in atrial fibrillation at last admission.  He should be anticoagulated.  Will need ECG and addressing of anticoagulation at next visit.   Followup in 2 wks with APP.   Loralie Champagne 08/22/2022

## 2022-08-27 ENCOUNTER — Encounter: Payer: Medicare Other | Admitting: Family

## 2022-08-30 ENCOUNTER — Ambulatory Visit: Payer: Medicare Other | Admitting: Internal Medicine

## 2022-09-02 ENCOUNTER — Telehealth: Payer: Self-pay

## 2022-09-02 NOTE — Telephone Encounter (Signed)
345 pm.  New Palliative Care referral received.  Phone call made to wife Juliann Pulse to schedule a home visit.  No answer.  Message left requesting a call back.

## 2022-09-06 ENCOUNTER — Telehealth: Payer: Self-pay

## 2022-09-06 DIAGNOSIS — D509 Iron deficiency anemia, unspecified: Secondary | ICD-10-CM | POA: Insufficient documentation

## 2022-09-06 NOTE — Telephone Encounter (Signed)
4 pm.  2nd attempt made to reach patient/spouse to schedule a Palliative Care home visit.  No answer.  Message left requesting a call back.

## 2022-09-06 NOTE — Telephone Encounter (Signed)
410 pm.  Incoming call from wife Juliann Pulse.  Home visit scheduled for next Friday.

## 2022-09-09 ENCOUNTER — Telehealth: Payer: Self-pay | Admitting: Podiatry

## 2022-09-09 NOTE — Telephone Encounter (Signed)
Called patient at [phone] and [phone] left messages on both numbers. Patient is scheduled to be seen on 09/10/22 I spoke with Benjamine Mola at the [facility] in reference to his [place] that has been denied for community care and that the patient has been referred to the [place] for an appointment. Patient can be seen in office using her Medicare benefits but not VA. Several attempts have been made to contact the patient from myself and scheduler Dawn. My number has been left so the patient can contact me directly.

## 2022-09-10 ENCOUNTER — Ambulatory Visit (INDEPENDENT_AMBULATORY_CARE_PROVIDER_SITE_OTHER): Payer: Medicare Other | Admitting: Podiatry

## 2022-09-10 DIAGNOSIS — M79674 Pain in right toe(s): Secondary | ICD-10-CM | POA: Diagnosis not present

## 2022-09-10 DIAGNOSIS — B353 Tinea pedis: Secondary | ICD-10-CM

## 2022-09-10 DIAGNOSIS — M79675 Pain in left toe(s): Secondary | ICD-10-CM | POA: Diagnosis not present

## 2022-09-10 DIAGNOSIS — B351 Tinea unguium: Secondary | ICD-10-CM

## 2022-09-10 MED ORDER — CLOTRIMAZOLE-BETAMETHASONE 1-0.05 % EX CREA: 1.0000 | TOPICAL_CREAM | Freq: Two times a day (BID) | CUTANEOUS | 3 refills | Status: DC

## 2022-09-10 NOTE — Progress Notes (Signed)
   Chief Complaint  Patient presents with   Diabetes    Diabetic foot care, A1c- BG-139, nail trim     SUBJECTIVE Patient presents to office today complaining of elongated, thickened nails that cause pain while ambulating in shoes.  Patient is unable to trim their own nails. Patient is here for further evaluation and treatment.  Past Medical History:  Diagnosis Date   CAD (coronary artery disease)    a. 2013 s/p CABG x 3 (LIMA->dLAD, VG->RPDA, VG->OM1); b. 12/2021 NSTEMI (peak HsTrop 641)-->Med rx in setting of CKD IV. EF 55-60% w/ rwma by echo.   Chronic heart failure with preserved ejection fraction (HFpEF) ([place])    a. 12/2021 Echo: EF 55-60%, no rwma, sev RV dysfxn w/ RVSP 69.78mmHg. Sev dil RA. Triv MR. Ao sclerosis.   CKD (chronic kidney disease), stage IV (HCC)    COPD (chronic obstructive pulmonary disease) ([place])    a. Home O2.   Diabetes ([place])    Essential hypertension    Hyperlipidemia LDL goal <70    NSTEMI (non-ST elevated myocardial infarction) ([place]) 01/13/2022   OSA (obstructive sleep apnea)    Pulmonary fibrosis (HCC)     Allergies  Allergen Reactions   Jardiance [Empagliflozin]    Penicillin G Other (See Comments)   Penicillins Swelling   Tramadol     Other reaction(s): Dizziness     OBJECTIVE General Patient is awake, alert, and oriented x 3 and in no acute distress. Derm Skin is dry and supple bilateral. Negative open lesions or macerations. Remaining integument unremarkable. Nails are tender, long, thickened and dystrophic with subungual debris, consistent with onychomycosis, 1-5 bilateral. No signs of infection noted. Vasc  DP and PT pedal pulses palpable bilaterally. Temperature gradient within normal limits.  Neuro Epicritic and protective threshold sensation grossly intact bilaterally.  Musculoskeletal Exam No symptomatic pedal deformities noted bilateral. Muscular strength within normal limits.  ASSESSMENT 1.  Pain due to onychomycosis of toenails  both 2.  Chronic tinea pedis both  PLAN OF CARE 1. Patient evaluated today.  2. Instructed to maintain good pedal hygiene and foot care.  3. Mechanical debridement of nails 1-5 bilaterally performed using a nail nipper. Filed with dremel without incident.  4.  Prescription for Lotrisone cream apply daily to address the chronic tinea pedis and dry skin of the feet 5.  Return to clinic in 3 mos.    Edrick Kins, DPM [facility]  Dr. Edrick Kins, DPM    [address], [place] [address]                Office [phone]  Fax [phone]

## 2022-09-12 ENCOUNTER — Ambulatory Visit: Payer: Medicare Other | Attending: Family | Admitting: Family

## 2022-09-12 ENCOUNTER — Ambulatory Visit (INDEPENDENT_AMBULATORY_CARE_PROVIDER_SITE_OTHER): Payer: Medicare Other | Admitting: Cardiovascular Disease

## 2022-09-12 ENCOUNTER — Encounter: Payer: Self-pay | Admitting: Cardiovascular Disease

## 2022-09-12 ENCOUNTER — Encounter: Payer: Self-pay | Admitting: Family

## 2022-09-12 VITALS — BP 110/50 | HR 88 | Ht 71.0 in | Wt 210.0 lb

## 2022-09-12 VITALS — BP 96/54 | HR 92 | Resp 16 | Wt 207.0 lb

## 2022-09-12 DIAGNOSIS — Z9981 Dependence on supplemental oxygen: Secondary | ICD-10-CM | POA: Insufficient documentation

## 2022-09-12 DIAGNOSIS — G4733 Obstructive sleep apnea (adult) (pediatric): Secondary | ICD-10-CM | POA: Diagnosis not present

## 2022-09-12 DIAGNOSIS — E1122 Type 2 diabetes mellitus with diabetic chronic kidney disease: Secondary | ICD-10-CM | POA: Diagnosis not present

## 2022-09-12 DIAGNOSIS — I5084 End stage heart failure: Secondary | ICD-10-CM | POA: Insufficient documentation

## 2022-09-12 DIAGNOSIS — J449 Chronic obstructive pulmonary disease, unspecified: Secondary | ICD-10-CM | POA: Diagnosis not present

## 2022-09-12 DIAGNOSIS — Z79899 Other long term (current) drug therapy: Secondary | ICD-10-CM | POA: Insufficient documentation

## 2022-09-12 DIAGNOSIS — Z951 Presence of aortocoronary bypass graft: Secondary | ICD-10-CM | POA: Diagnosis not present

## 2022-09-12 DIAGNOSIS — I2510 Atherosclerotic heart disease of native coronary artery without angina pectoris: Secondary | ICD-10-CM

## 2022-09-12 DIAGNOSIS — I4891 Unspecified atrial fibrillation: Secondary | ICD-10-CM | POA: Insufficient documentation

## 2022-09-12 DIAGNOSIS — Z7901 Long term (current) use of anticoagulants: Secondary | ICD-10-CM | POA: Insufficient documentation

## 2022-09-12 DIAGNOSIS — I10 Essential (primary) hypertension: Secondary | ICD-10-CM

## 2022-09-12 DIAGNOSIS — Z87891 Personal history of nicotine dependence: Secondary | ICD-10-CM | POA: Diagnosis not present

## 2022-09-12 DIAGNOSIS — I5033 Acute on chronic diastolic (congestive) heart failure: Secondary | ICD-10-CM

## 2022-09-12 DIAGNOSIS — I5020 Unspecified systolic (congestive) heart failure: Secondary | ICD-10-CM | POA: Diagnosis not present

## 2022-09-12 DIAGNOSIS — I25810 Atherosclerosis of coronary artery bypass graft(s) without angina pectoris: Secondary | ICD-10-CM

## 2022-09-12 DIAGNOSIS — N184 Chronic kidney disease, stage 4 (severe): Secondary | ICD-10-CM | POA: Insufficient documentation

## 2022-09-12 DIAGNOSIS — J8410 Pulmonary fibrosis, unspecified: Secondary | ICD-10-CM

## 2022-09-12 DIAGNOSIS — I2729 Other secondary pulmonary hypertension: Secondary | ICD-10-CM | POA: Diagnosis not present

## 2022-09-12 DIAGNOSIS — I480 Paroxysmal atrial fibrillation: Secondary | ICD-10-CM

## 2022-09-12 DIAGNOSIS — I2781 Cor pulmonale (chronic): Secondary | ICD-10-CM | POA: Diagnosis not present

## 2022-09-12 DIAGNOSIS — I130 Hypertensive heart and chronic kidney disease with heart failure and stage 1 through stage 4 chronic kidney disease, or unspecified chronic kidney disease: Secondary | ICD-10-CM | POA: Insufficient documentation

## 2022-09-12 DIAGNOSIS — I499 Cardiac arrhythmia, unspecified: Secondary | ICD-10-CM | POA: Insufficient documentation

## 2022-09-12 DIAGNOSIS — E119 Type 2 diabetes mellitus without complications: Secondary | ICD-10-CM | POA: Insufficient documentation

## 2022-09-12 DIAGNOSIS — I5082 Biventricular heart failure: Secondary | ICD-10-CM | POA: Diagnosis not present

## 2022-09-12 DIAGNOSIS — I5030 Unspecified diastolic (congestive) heart failure: Secondary | ICD-10-CM | POA: Insufficient documentation

## 2022-09-12 MED ORDER — APIXABAN 2.5 MG PO TABS: 2.5000 mg | ORAL_TABLET | Freq: Two times a day (BID) | ORAL | 5 refills | Status: DC

## 2022-09-12 NOTE — Progress Notes (Signed)
Patient ID: Jacob Silva, male    DOB: 02-19-1932, 89 y.o.   MRN: [id number]  HPI  Jacob Silva is a 89 y.o. male with history of CKD stage 4, CAD status post CABG, cor pulmonale, pulmonary HTN, pulmonary fibrosis, chronic respiratory failure on home O2, COPD, Type 2 diabetes and OSA on CPAP.    Echo in 6/23 showed normal LVEF 55-60%, RV severely enlarged w/ severely reduced systolic function and severely elevated RVSP, 69 mmHg, severe TR, RA severely dilated. Only trivial MR. He has primarily WHO Group 3 PH from underlying lung disease (COPD, Pulmonary Fibrosis and OSA).  Admitted on 08/11/22 with CHF/cor pulmonale and diuresed.  ECGs from this admission were reviewed, he was noted to be in atrial fibrillation during that admission but has not been on anticoagulation. Admitted on 02/14/2022 for acute on chronic diastolic heart failure.  He was diuresed w/ IV Lasix and transition to torsemide 40 mg daily. BP was too soft for GDMT. Coreg discontinued. Given advanced age, frailty and significant CKD Stage IV, he was felt not a candidate for advanced therapies of any sort. He was made DNR. D/c home on home O2 (2L/min).    Patient returns for followup of RV failure with a chief complaint of moderate SOB with minimal exertion. Describes this as chronic in nature. He has associated fatigue, pedal edema and occasional nosebleed along with this. Denies any difficulty sleeping, dizziness, abdominal distention, cough or weight gain.   Did try decreasing his torsemide to 83m AM/ 448mPM but then developed worsening edema and weight gain so went back up to 6028mID with improvement of symptoms.   Had lab work drawn earlier today. Mentions that he occasionally has nosebleeds and had a recent one 4 days ago. They applied pressure and it did stop after ~ 4 hours. Has saline nasal spray that he occasionally uses at home. Wears oxygen RTC.   Given his cardiac condition, continuous supplemental O2 requirements  and fragility,  he requires 24 hr care from a in-home caregiver.   Past Medical History:  Diagnosis Date   CAD (coronary artery disease)    a. 2013 s/p CABG x 3 (LIMA->dLAD, VG->RPDA, VG->OM1); b. 12/2021 NSTEMI (peak HsTrop 641)-->Med rx in setting of CKD IV. EF 55-60% w/ rwma by echo.   Chronic heart [place] with [place] ejection fraction ([place]EF) [place]  a. 12/2021 Echo: EF 55-60%, no rwma, sev RV dysfxn w/ RVSP 69.2mm66m Sev dil RA. Triv MR. Ao sclerosis.   CKD (chronic kidney disease), stage IV (HCC)    COPD (chronic obstructive pulmonary disease) (HCC)[place] a. Home O2.   Diabetes (HCC)[place] Essential hypertension    Hyperlipidemia LDL goal <70    NSTEMI (non-ST elevated myocardial infarction) (HCC)[place]/18/2023   OSA (obstructive sleep apnea)    Pulmonary fibrosis (HCC)    Past Surgical History:  Procedure Laterality Date   BACK SURGERY     HERNIA REPAIR     KNEE SURGERY Right    TRIPLE BYPASS     Family History  Problem Relation Age of Onset   Diabetes Mother    Kidney disease Mother    Heart attack Brother    Throat cancer Brother    Social History   Tobacco Use   Smoking status: Former    Types: Cigarettes    Quit date: 1975    Years since quitting: 49.1   Smokeless tobacco: Never  Substance Use Topics  Alcohol use: No   Allergies  Allergen Reactions   Jardiance [Empagliflozin]    Penicillin G Other (See Comments)   Penicillins Swelling   Tramadol     Other reaction(s): Dizziness   Prior to Admission medications   Medication Sig Start Date End Date Taking? Authorizing Provider  acetaminophen (TYLENOL) 325 MG tablet Take 650 mg by mouth every 6 (six) hours as needed for mild pain.   Yes [provider]  albuterol (VENTOLIN HFA) 108 (90 Base) MCG/ACT inhaler Inhale 1 puff into the lungs every 6 (six) hours as needed for wheezing or shortness of breath. 10/12/13  Yes [provider]  allopurinol (ZYLOPRIM) 100 MG tablet Take 100 mg by mouth  daily.   Yes [provider]  aspirin EC 81 MG tablet Take 81 mg by mouth daily.   Yes [provider]  atorvastatin (LIPITOR) 80 MG tablet Take 40 mg by mouth at bedtime.   Yes [provider]  clopidogrel (PLAVIX) 75 MG tablet Take 75 mg by mouth daily.   Yes [provider]  clotrimazole-betamethasone (LOTRISONE) cream Apply 1 Application topically 2 (two) times daily. 09/10/22  Yes Edrick Kins, DPM  dapagliflozin propanediol (FARXIGA) 10 MG TABS tablet Take 10 mg by mouth daily. 03/14/21  Yes [provider]  diclofenac Sodium (VOLTAREN) 1 % GEL Apply 2 g topically 4 (four) times daily as needed (pain). 02/05/21  Yes [provider]  famotidine (PEPCID) 20 MG tablet Take 1 tablet (20 mg total) by mouth daily as needed for heartburn or indigestion. 02/19/22  Yes Oswald Hillock, MD  formoterol (PERFOROMIST) 20 MCG/2ML nebulizer solution Take 20 mcg by nebulization 2 (two) times daily.   Yes [provider]  gabapentin (NEURONTIN) 300 MG capsule Take 1 capsule (300 mg total) by mouth 2 (two) times daily. 08/16/22  Yes Hollice Gong, Mir M, MD  gabapentin (NEURONTIN) 400 MG capsule Take 400 mg by mouth as directed. Take 2 capsules by mouth twice daily   Yes [provider]  LANTUS SOLOSTAR 100 UNIT/ML Solostar Pen Inject 20 Units into the skin at bedtime. Patient taking differently: Inject 35 Units into the skin at bedtime. 08/16/22  Yes Hollice Gong, Mir M, MD  levothyroxine (SYNTHROID) 100 MCG tablet Take 100 mcg by mouth every morning. 12/11/20  Yes [provider]  loperamide (IMODIUM) 2 MG capsule Take 2 mg by mouth daily as needed for diarrhea or loose stools. 03/13/20  Yes [provider]  loratadine (CLARITIN) 10 MG tablet Take 10 mg by mouth daily as needed for allergies.   Yes [provider]  Propylene Glycol (SYSTANE BALANCE) 0.6 % SOLN Place 1 drop into both eyes daily as needed (dry eyes).   Yes  [provider]  torsemide (DEMADEX) 20 MG tablet Take 3 tablets (60 mg total) by mouth 2 (two) times daily. Take 3 tablets (60 mg total) by mouth in the AM, and 2 tablets (40 mg total) in the PM 08/22/22  Yes Larey Dresser, MD  umeclidinium-vilanterol (ANORO ELLIPTA) 62.5-25 MCG/INH AEPB Inhale 1 puff into the lungs daily.   Yes [provider]   Review of Systems  Constitutional:  Positive for fatigue. Negative for appetite change.  HENT:  Positive for nosebleeds. Negative for congestion and sore throat.   Eyes: Negative.   Respiratory:  Positive for shortness of breath (easily). Negative for cough and chest tightness.   Cardiovascular:  Positive for leg swelling. Negative for chest pain and palpitations.  Gastrointestinal:  Negative for abdominal distention and abdominal pain.  Endocrine: Negative.   Genitourinary: Negative.   Musculoskeletal:  Negative for back pain and neck pain.  Skin: Negative.   Allergic/Immunologic: Negative.   Neurological:  Negative for dizziness and light-headedness.  Hematological:  Negative for adenopathy. Does not bruise/bleed easily.  Psychiatric/Behavioral:  Negative for dysphoric mood and sleep disturbance (sleeping with CPAP and 1 pillow). The patient is not nervous/anxious.    Vitals:   09/12/22 1349  BP: (!) 96/54  Pulse: 92  Resp: 16  SpO2: 94%  Weight: 207 lb (93.9 kg)   Wt Readings from Last 3 Encounters:  09/12/22 207 lb (93.9 kg)  09/12/22 210 lb (95.3 kg)  08/28/22 210 lb (95.3 kg)   Lab Results  Component Value Date   CREATININE 2.63 (H) 08/22/2022   CREATININE 2.49 (H) 08/16/2022   CREATININE 2.38 (H) 08/15/2022   Physical Exam Vitals and nursing note reviewed. Exam conducted with a chaperone present (wife).  Constitutional:      Appearance: Normal appearance.  HENT:     Head: Normocephalic and atraumatic.  Cardiovascular:     Rate and Rhythm: Normal rate and regular rhythm.  Pulmonary:     Effort:  Pulmonary effort is normal. No respiratory distress.     Breath sounds: No wheezing, rhonchi or rales.  Abdominal:     General: There is no distension.     Palpations: Abdomen is soft.     Tenderness: There is no abdominal tenderness.  Musculoskeletal:        General: No tenderness.     Cervical back: Normal range of motion and neck supple.     Right lower leg: Edema (2+ pitting) present.     Left lower leg: Edema (2+ pitting) present.  Skin:    General: Skin is warm and dry.  Neurological:     General: No focal deficit present.     Mental Status: He is alert and oriented to person, place, and time.  Psychiatric:        Mood and Affect: Mood normal.        Behavior: Behavior normal.   ASSESSMENT & PLAN:  1: Chronic RV failure with pulmonary hypertension, Cor pulmonale in setting of severe lung disease- - NYHA class III - minimally fluid overloaded with edema - weighing daily; reminded to call for overnight weight gain of > 2 pounds or a weekly weight gain of > 5 pounds - weight down 11 pounds from last visit 3 weeks ago - torsemide 15m BID; wife says they tried to decrease torsemide to 632mAM/ 4043mM but weight went up and edema worsened so they resumed the 22m17mD with improvement of symptoms  - KCl 20 daily - Farxiga 10 mg daily  - had BMP done earlier today - saw [facility] provider (McLAundra Dubin25/24 - BNP 08/22/22 was 683.2  2: HTN with CKD - BP 96/54; denies dizziness - to see PCP (Tejan-Sie) 10/16/22 - BMP 08/22/22 showed sodium 140, potassium 4.0, creatinine 2.63 & GFR 23  3:COPD- - Remote smoker - oxygen at 2.5-3L at home - does not want to go to a pulmonologist.   4: Pulmonary fibrosis- - history of heavy Agent Orange exposure - does not want to see a pulmonologist or have further pulmonary workup.  - continue home oxygen.   5: OSA- - continue CPAP nightly  6: CAD- - s/p CABG in 2013 - ASA 81 - atorvastatin 80 mg daily - LDL 08/22/22 was  27  7: Paroxsymal  Atrial fibrillation-   - unable to obtain EKG in the office due to issues with EKG machine; will do EKG at next visit - previous EKG's showed a fib - saw cardiology Humphrey Rolls) 09/12/22 - after consulting with Dr Aundra Dubin, patient to stop taking aspirin & starting tomorrow, begin taking eliquis as 2.75m BID - did encourage more consistent use of saline nasal spray to keep nasal passages moist   Medication list reviewed.   Return in 1 month, sooner if needed. They request returning on the same day as his cardiology appt to make it easier for the patient.

## 2022-09-12 NOTE — Patient Instructions (Addendum)
Continue weighing daily and call for an overnight weight gain of 3 pounds or more or a weekly weight gain of more than 5 pounds.    Do not take anymore aspirin.   Beginning tomorrow, you will start taking eliquis as 1 tablet in the morning and 1 tablet in the evening.    Continue using your saline nasal spray

## 2022-09-12 NOTE — Progress Notes (Signed)
Cardiology Office Note   Date:  09/12/2022   ID:  Jacob, Silva [birth year], MRN [id number]  PCP:  [name], MD  Cardiologist:  Neoma Laming, MD      History of Present Illness: Jacob Silva is a 89 y.o. male who presents for  Chief Complaint  Patient presents with   Follow-up    2 month follow up    Shortness of Breath This is a chronic problem. The current episode started in the past 7 days. The problem has been unchanged. Associated symptoms include leg swelling and PND. The treatment provided moderate relief.       Past Medical History:  Diagnosis Date   CAD (coronary artery disease)    a. 2013 s/p CABG x 3 (LIMA->dLAD, VG->RPDA, VG->OM1); b. 12/2021 NSTEMI (peak HsTrop 641)-->Med rx in setting of CKD IV. EF 55-60% w/ rwma by echo.   Chronic heart failure with preserved ejection fraction (HFpEF) ([place])    a. 12/2021 Echo: EF 55-60%, no rwma, sev RV dysfxn w/ RVSP 69.35mHg. Sev dil RA. Triv MR. Ao sclerosis.   CKD (chronic kidney disease), stage IV (HCC)    COPD (chronic obstructive pulmonary disease) ([facility]    a. Home O2.   Diabetes ([place]    Essential hypertension    Hyperlipidemia LDL goal <70    [place]non-ST elevated [place] infarction) [place]   OSA (obstructive sleep apnea)    Pulmonary fibrosis (HCC)      Past Surgical History:  Procedure Laterality Date   BACK SURGERY     HERNIA REPAIR     KNEE SURGERY Right    TRIPLE BYPASS       Current Outpatient Medications  Medication Sig Dispense Refill   acetaminophen (TYLENOL) 325 MG tablet Take 650 mg by mouth every 6 (six) hours as needed for mild pain.     albuterol (VENTOLIN HFA) 108 (90 Base) MCG/ACT inhaler Inhale 1 puff into the lungs every 6 (six) hours as needed for wheezing or shortness of breath.     allopurinol (ZYLOPRIM) 100 MG tablet Take 100 mg by mouth daily.     aspirin EC 81 MG tablet Take 81 mg by mouth daily.     atorvastatin (LIPITOR) 80 MG tablet Take 40  mg by mouth at bedtime.     clopidogrel (PLAVIX) 75 MG tablet Take 75 mg by mouth daily.     clotrimazole-betamethasone (LOTRISONE) cream Apply 1 Application topically 2 (two) times daily. 45 g 3   dapagliflozin propanediol (FARXIGA) 10 MG TABS tablet Take 10 mg by mouth daily.     diclofenac Sodium (VOLTAREN) 1 % GEL Apply 2 g topically 4 (four) times daily as needed (pain).     famotidine (PEPCID) 20 MG tablet Take 1 tablet (20 mg total) by mouth daily as needed for heartburn or indigestion. 30 tablet 2   formoterol (PERFOROMIST) 20 MCG/2ML nebulizer solution Take 20 mcg by nebulization 2 (two) times daily.     gabapentin (NEURONTIN) 400 MG capsule Take 400 mg by mouth as directed. Take 2 capsules by mouth twice daily     LANTUS SOLOSTAR 100 UNIT/ML Solostar Pen Inject 20 Units into the skin at bedtime. (Patient taking differently: Inject 35 Units into the skin at bedtime.) 15 mL 11   levothyroxine (SYNTHROID) 100 MCG tablet Take 100 mcg by mouth every morning.     loperamide (IMODIUM) 2 MG capsule Take 2 mg by mouth daily as needed for diarrhea or  loose stools.     loratadine (CLARITIN) 10 MG tablet Take 10 mg by mouth daily as needed for allergies.     Propylene Glycol (SYSTANE BALANCE) 0.6 % SOLN Place 1 drop into both eyes daily as needed (dry eyes).     torsemide (DEMADEX) 20 MG tablet Take 3 tablets (60 mg total) by mouth 2 (two) times daily. Take 3 tablets (60 mg total) by mouth in the AM, and 2 tablets (40 mg total) in the PM 150 tablet 3   umeclidinium-vilanterol (ANORO ELLIPTA) 62.5-25 MCG/INH AEPB Inhale 1 puff into the lungs daily.     gabapentin (NEURONTIN) 300 MG capsule Take 1 capsule (300 mg total) by mouth 2 (two) times daily. (Patient not taking: Reported on 09/12/2022) 60 capsule 0   No current facility-administered medications for this visit.    Allergies:   Jardiance [empagliflozin], Penicillin g, Penicillins, and Tramadol    Social History:   reports that he quit smoking  about 49 years ago. His smoking use included cigarettes. He has never used smokeless tobacco. He reports that he does not drink alcohol and does not use drugs.   Family History:  family history includes Diabetes in his mother; Heart attack in his brother; Kidney disease in his mother; Throat cancer in his brother.    ROS:     Review of Systems  Constitutional: Negative.   HENT: Negative.    Eyes: Negative.   Respiratory:  Positive for shortness of breath.   Cardiovascular:  Positive for leg swelling and PND.  Gastrointestinal: Negative.   Genitourinary: Negative.   Musculoskeletal: Negative.   Skin: Negative.   Neurological: Negative.   Endo/Heme/Allergies: Negative.   Psychiatric/Behavioral: Negative.    All other systems reviewed and are negative.     All other systems are reviewed and negative.    PHYSICAL EXAM: VS:  BP (!) 110/50   Pulse 88   Ht 5' 11"$  (1.803 m)   Wt 210 lb (95.3 kg)   SpO2 (!) 85%   BMI 29.29 kg/m  , BMI Body mass index is 29.29 kg/m. Last weight:  Wt Readings from Last 3 Encounters:  09/12/22 210 lb (95.3 kg)  08/28/22 210 lb (95.3 kg)  08/22/22 218 lb (98.9 kg)     Physical Exam Vitals reviewed.  Constitutional:      Appearance: Normal appearance. He is normal weight.  HENT:     Head: Normocephalic.     Nose: Nose normal.     Mouth/Throat:     Mouth: Mucous membranes are moist.  Eyes:     Pupils: Pupils are equal, round, and reactive to light.  Cardiovascular:     Rate and Rhythm: Normal rate and regular rhythm.     Pulses: Normal pulses.     Heart sounds: Normal heart sounds.  Pulmonary:     Effort: Pulmonary effort is normal.  Abdominal:     General: Abdomen is flat. Bowel sounds are normal.  Musculoskeletal:        General: Normal range of motion.     Cervical back: Normal range of motion.  Skin:    General: Skin is warm.  Neurological:     General: No focal deficit present.     Mental Status: He is alert.  Psychiatric:         Mood and Affect: Mood normal.       EKG:   Recent Labs: 02/15/2022: ALT 19 08/11/2022: Magnesium 2.4 08/16/2022: Hemoglobin 10.8; Platelets 170 08/22/2022: B Natriuretic  Peptide 683.2; BUN 40; Creatinine, Ser 2.63; Potassium 4.0; Sodium 140    Lipid Panel    Component Value Date/Time   CHOL 76 08/22/2022 1120   TRIG 44 08/22/2022 1120   HDL 40 (L) 08/22/2022 1120   CHOLHDL 1.9 08/22/2022 1120   VLDL 9 08/22/2022 1120   LDLCALC 27 08/22/2022 1120      REASON FOR VISIT  Visit for: Echocardiogram/R06.02  Sex:   Male         wt= 210   lbs.  BP=82/48  Height=73    inches.        TESTS  Imaging: Echocardiogram:  An echocardiogram in (2-d) mode was performed and in Doppler mode with color flow velocity mapping was performed. The aortic valve cusps are abnormal 1.9  cm, flow velocity 0000000  m/s, and systolic calculated mean flow gradient 5  mmHg. Mitral valve diastolic peak flow velocity E .834   m/s and E/A ratio 3.0. Aortic root diameter 3.7   cm. The LVOT internal diameter 3.0  cm and flow velocity was abnormal .932   m/s. LV systolic dimension [id number]  cm, diastolic [id number]  cm, posterior wall thickness 1.43   cm, fractional shortening 37.4 %, and EF 68.5 %. IVS thickness 1.72   cm. LA dimension 4.3 cm. Tricuspid Valve has Severe Regurgitation. Mitral Valve has Mild Regurgitation. Pulmonic Valve has Trace Regurgitation.     ASSESSMENT  Technically adequate study.  Normal left ventricular systolic function.  Mild left ventricular hypertrophy with GRADE 2 (psuedonormalization) diastolic dysfunction.  Normal right ventricular systolic function.  Normal right ventricular diastolic function.  Normal left ventricular wall motion.  Normal right ventricular wall motion.  Trace pulmonary regurgitation.  Severe tricuspid regurgitation.  Mild pulmonary hypertension.  Mild mitral regurgitation.  No pericardial effusion.  Moderately dilated Left and Right atrium   Moderately dilated Right ventricle  Mild LVH.     THERAPY   Referring physician: Dionisio David  Sonographer: Neoma Laming.      Neoma Laming MD  Electronically signed by: Neoma Laming     Date: 02/28/2022 14:01 Other studies Reviewed: TESTS    [facility] [address] [place], [place] [address] [phone] STUDY:  Gated Stress / Rest Myocardial Perfusion Imaging Tomographic (SPECT) Including attenuation correction Wall Motion, Left Ventricular Ejection Fraction By Gated Technique.Persantine Stress Test. SEX: Male  WEIGHT: 203 lbs  HEIGHT: 71 in     ARMS UP: YES/NO                                                                                                                                                                                REFERRING PHYSICIAN: Dr.Lashauna Arpin  Destane Speas                                                                                                                                                                                                                       INDICATION FOR STUDY:  SOB                                                                                                                                                                                                                    TECHNIQUE:  Approximately 20 minutes following the intravenous administration of 10.4 mCi of Tc-15mSestamibi after stress testing in a reclined supine position with arms above their head if able to do so, gated SPECT imaging of the heart was performed. After about a 2hr break, the patient was injected intravenously with 33.4 mCi of Tc-921mestamibi.  Approximately 45 minutes later in the same position as stress imaging SPECT rest imaging of the heart was performed.  STRESS BY:  ShNeoma LamingMD PROTOCOL:  Persantine   DOSE ADMIN: 10 CC    ROUTE OF ADMINISTRATION: IV  MAX PRED  HR: 132                     85%: 112               75%:  99                                                                                                                   RESTING BP: 122/68  RESTING HR: 103  PEAK BP: 118/62  PEAK HR: 103                                                                    EXERCISE DURATION:    4 min injection                                            REASON FOR TEST TERMINATION:    Protocol end                                                                                                                              SYMPTOMS:   None  EKG RESULTS: Baseline: Sinus tachycardia. 103/min. No significant ST changes with persantine.                                                             IMAGE QUALITY: Good                                                                                                                                                                                                                                                                                                                                  PERFUSION/WALL MOTION FINDINGS: EF = 70%. Small mild reversible apex (17), small mild fixed basal and mid inferior wall defects, normal wall motion.                                                                           IMPRESSION:  Equivocal stress test with normal LVEF, advise CCTA.  Neoma Laming, MD Stress Interpreting Physician / Nuclear Interpreting Physician                 Neoma Laming MD  Electronically signed by: Neoma Laming     Date: 03/07/2022 11:04 Additional studies/ records that were reviewed today include:  Review of the above records demonstrates:       No data to display            ASSESSMENT AND PLAN:    ICD-10-CM   1. Acute on chronic diastolic CHF (congestive heart failure) (HCC)  I50.33    On high dosage toresemide, and will check met c    2. 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 Paroxysmal atrial fibrillation (HCC)  I48.0     4. Essential (primary) hypertension  I10        Problem List Items Addressed This Visit       Cardiovascular and Mediastinum   Acute on chronic diastolic CHF (congestive heart failure) (HCC) - Primary   Atherosclerosis of coronary artery bypass graft(s) without angina pectoris   Atrial fibrillation ([place])   Essential (primary) hypertension       Disposition:   Return in about 4 weeks (around 10/10/2022).      Signed,  Neoma Laming, MD  09/12/2022 11:01 AM    [facility]

## 2022-09-13 ENCOUNTER — Other Ambulatory Visit: Payer: Self-pay

## 2022-09-13 ENCOUNTER — Other Ambulatory Visit: Payer: TRICARE For Life (TFL)

## 2022-09-13 VITALS — BP 102/60 | HR 83 | Temp 97.6°F | Wt 199.0 lb

## 2022-09-13 DIAGNOSIS — Z515 Encounter for palliative care: Secondary | ICD-10-CM

## 2022-09-13 LAB — BASIC METABOLIC PANEL
BUN/Creatinine Ratio: 13 (ref 10–24)
BUN: 39 mg/dL — ABNORMAL HIGH (ref 8–27)
CO2: 27 mmol/L (ref 20–29)
Calcium: 9.4 mg/dL (ref 8.6–10.2)
Chloride: 103 mmol/L (ref 96–106)
Creatinine, Ser: 2.93 mg/dL — ABNORMAL HIGH (ref 0.76–1.27)
Glucose: 168 mg/dL — ABNORMAL HIGH (ref 70–99)
Potassium: 4.3 mmol/L (ref 3.5–5.2)
Sodium: 148 mmol/L — ABNORMAL HIGH (ref 134–144)
eGFR: 20 mL/min/{1.73_m2} — ABNORMAL LOW (ref >59)

## 2022-09-13 NOTE — Progress Notes (Signed)
PATIENT NAME: Jacob Silva DOB: 02-01-1932 MRN: [id number]  PRIMARY CARE PROVIDER: [name], MD  RESPONSIBLE PARTY:  Acct ID - Guarantor Home Phone Work Phone Relationship Acct Type  [id number] DANNIEL, [phone]  Self P/F     [place], [place], [place] [address]   Home visit completed with patient, wife, and [place], [place].  ACP:  Currently has AD in place.  Desires DNR but does not have this in place.  Patient desires to remain in his home but is open to hospitalization.  Appetite:  Poor  Heart Failure:  Patient reports fullness to abdomen this am.  Noted fluid present.  He has not been able to eat much this morning.  Patient advised he missed his dose of torsemide yesterday as he was at [facility] appointments.  2 lb weight gain noted since yesterday.  He weighed 197 lbs on home scale and 199 lbs today. Patient endorses shortness of breath with exertion.  He also reports PND that occurs regularly.  Patient denies dizziness, headaches, chest pain or palpitations.   Home Health:  Currently has Armed forces technical officer, PT in place.  [facility] providing an aide through [place] on Fridays for 4 hours.  Palliative Care vs Hospice:  Palliative Care services explained and consent signed.  Discussed hospice services with wife and patient.  He is open to hospice support but in the home.     CODE STATUS: Currently full code but desires DNR.  ADVANCED DIRECTIVES: Yes MOST FORM: No PPS: 40%   PHYSICAL EXAM:   VITALS: Today's Vitals   09/13/22 1042  BP: 102/60  Pulse: 83  Temp: 97.6 F (36.4 C)  SpO2: 93%  Weight: 199 lb (90.3 kg)    LUNGS:  faint crackles present at the bases. CARDIAC: Cor RRR}  EXTREMITIES: 1+ ankle edema SKIN: Skin color, texture, turgor normal. No rashes or lesions or normal  NEURO: positive for gait problems and weakness       Lorenza Burton, RN

## 2022-09-13 NOTE — Progress Notes (Signed)
COMMUNITY PALLIATIVE CARE SW NOTE  PATIENT NAME: Jacob Silva DOB: 09-Sep-1931 MRN: [id number]  PRIMARY CARE PROVIDER: [name], MD  RESPONSIBLE PARTY:  Acct ID - Guarantor Home Phone Work Phone Relationship Acct Type  [id number] HIMANSHU, [phone]  Self P/F     [place], [place], [place] [address]     PLAN OF CARE and INTERVENTIONS:              GOALS OF CARE/ ADVANCE CARE PLANNING:    Goals include to maximize quality of life and assist with pain management. Our advance care planning conversation included a discussion about:    The value and importance of advance care planning  Review and updating or creation of an advance directive document.                          Code status: wishes to be DNR.                          Advance directives: Spouse, Juliann Pulse, holds durable POA. Patient has living will.  2.        SOCIAL/EMOTIONAL/SPIRITUAL ASSESSMENT/ INTERVENTIONS:         Palliative care encounter: SW and RN completed initial joint in home visit with patient and spouse,   Presenting problem: New palliative care referral received from PCP Dr. Elijio Miles to review and discuss goals of care and provide additional support. Patient states that he wishes to comfortabel and avoid any aggressive treatments/interventions.  Pain: denies pain  Appetite:is poor, only eats bites of one meal a day. Drinks glucerna every other day.  Mobility: share that he can be unsteady on feet at times. Last fall was Dec 2023. Utilizes rollator periodically. Requires assistance with ADL's every now and then.  PC will continue to monitor for hospice eligibility.    Psychosocial assessment: completed.   In home support: patient resides in home with spouse. Patient has [facility] Higher education careers adviser and PT. Patient also has [facility] private caregiver Q fri.    Transportation: no needs.  Food: no food insecurities witnessed.   Safety and long term planning: patient feels safe in his home  and desires to remain in his home with spouse.    SW discussed goals, reviewed care plan, provided emotional support, used active and reflective listening in the form of reciprocity emotional response. Questions and concerns were addressed. The patient/family was encouraged to call with any additional questions and/or concerns. PC Provided general support and encouragement, no other unmet needs identified. Will continue to follow.   3.         PATIENT/CAREGIVER EDUCATION/ COPING:   Appearance: well groomed, appropriate given situation  Mental Status: Alert/oriented. Eye Contact: Good. Able to engage in proper eye contact  Thought Process: rational  Thought Content: not assessed  Speech: normal  Mood: Normal and calm Affect: Congruent to endorsed mood, full ranging Insight: normal Judgement: normal  Interaction Style: Cooperative   Patient A&O and able to make needs known, patient engaged in fluent conversation and answered all questions appropriately. No cognitive deficits witnessed this visit. Patient deny anxiety and depression. PHQ-9: 0.  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 Patient also is a retired Theme park manager. Patient has 2 biological childrend and 2 step children.    4.         PERSONAL EMERGENCY PLAN:  Patient/spouse will call 9-1-1 for emergencies.  5.         COMMUNITY RESOURCES COORDINATION/ HEALTH CARE NAVIGATION:  patients spouses manages his care.    6.      FINANCIAL CONCERNS/NEEDS: Patient does not qualify for community Medicaid.                          Primary Health Insurance:  Medicare Secondary Health Insurance: tricare Prescription Coverage: Yes, no history of difficulty obtaining or affording prescriptions reported.     SOCIAL HX:  Social History   Tobacco Use   Smoking status: Former    Types: Cigarettes    Quit date: 1975    Years since quitting: 49.1   Smokeless tobacco: Never  Substance Use Topics   Alcohol use: No    CODE STATUS: wishes to  be DNR ADVANCED DIRECTIVES: Y MOST FORM COMPLETE:  N HOSPICE EDUCATION PROVIDED: N  PPS: patient is (I) - SUP with ADL's most days.       Doreene Eland, [place]

## 2022-09-16 ENCOUNTER — Telehealth: Payer: Self-pay

## 2022-09-16 NOTE — Telephone Encounter (Signed)
Jacob Silva with [facility] called stating that the patient would like to get a DNR form and wanted to know if you would sign off on  this

## 2022-09-16 NOTE — Telephone Encounter (Signed)
930 am.  Phone call made to PCP office to request DNR per patient's request.  Message left on nurse's VM for [facility].

## 2022-09-17 NOTE — Telephone Encounter (Signed)
LM for Almyra Free to call back

## 2022-09-19 ENCOUNTER — Other Ambulatory Visit: Payer: Self-pay | Admitting: Family

## 2022-09-19 ENCOUNTER — Telehealth: Payer: Self-pay | Admitting: Family

## 2022-09-19 MED ORDER — TORSEMIDE 20 MG PO TABS: ORAL_TABLET | ORAL | 3 refills | Status: DC

## 2022-09-19 MED ORDER — APIXABAN 2.5 MG PO TABS: 2.5000 mg | ORAL_TABLET | Freq: Two times a day (BID) | ORAL | 5 refills | Status: DC

## 2022-09-19 NOTE — Progress Notes (Signed)
Eliquis RX printed to be faxed to [facility]

## 2022-09-19 NOTE — Telephone Encounter (Signed)
Patient's wife called to say that she was concerned about his weight loss. She says that he's lost 5-7 pounds this week and his weight is now down to 194 pounds and previously was in the 200's. She says that his legs/ abdomen don't have any swelling in them and he "feels great".   He's currently taking torsemide 48m QM/ 216mPM. Advised her to decrease his torsemide to 406mAM and use the PM 67m73mse PRN for weight gain, swelling or SOB.   She verbalized understanding.

## 2022-09-20 ENCOUNTER — Other Ambulatory Visit: Payer: Self-pay | Admitting: Internal Medicine

## 2022-09-20 NOTE — Telephone Encounter (Signed)
Sent message via mychart

## 2022-09-25 ENCOUNTER — Other Ambulatory Visit: Payer: Medicare Other

## 2022-09-25 VITALS — BP 112/78 | HR 88 | Temp 97.4°F | Wt 195.0 lb

## 2022-09-25 DIAGNOSIS — Z515 Encounter for palliative care: Secondary | ICD-10-CM

## 2022-09-25 NOTE — Progress Notes (Signed)
PATIENT NAME: Jacob Silva DOB: 31-Aug-1931 MRN: [id number]  PRIMARY CARE PROVIDER: [name], MD  RESPONSIBLE PARTY:  Acct ID - Guarantor Home Phone Work Phone Relationship Acct Type  [id number] [name], [name]  Self P/F     [place], [place], [place] [address]   Home visit completed with patient, spouse and [place], [place].  Appetite:  Continues to be poor per wife.  Patient taking in liquids well.    Iron:  Completed iron infusion since last visit.  No noted side effects reported by patient.   HF:  Patient verbalized not feeling well this morning. Torsemide recently decreased.  Patient will take 20 mg torsemide prn afternoon dose if weight gain is 3 or more pounds.  Wife requesting clarification on when to take metolazone. Secure message sent to Darylene Price, NP.  If prn torsemide does not work 2 days in a row, patient will contact NP and she will direct on next steps.  Patient and spouse verbalized understanding.   Home Health/Hospice:  Continues with home health therapy and nursing.  Patient enjoys having PT at this time.  Likely hospice appropriate.  Will discuss on next visit.    CODE STATUS: DNR-wife to pick up form this week from PCP office. ADVANCED DIRECTIVES: Yes MOST FORM: No PPS: 40%   PHYSICAL EXAM:   VITALS: Today's Vitals   09/25/22 1207  BP: 112/78  Pulse: 88  Temp: (!) 97.4 F (36.3 C)  SpO2: 94%  Weight: 195 lb (88.5 kg)    LUNGS: scattered rales bilaterally CARDIAC: Cor RRR}  EXTREMITIES: 1+ bilateral lower extremity edema SKIN: Skin color, texture, turgor normal. No rashes or lesions or patient rash to groin.  Possibly yeast. Wife to contact PCP to address.   NEURO: positive for gait problems       Lorenza Burton, RN

## 2022-09-25 NOTE — Progress Notes (Signed)
COMMUNITY PALLIATIVE CARE SW NOTE  PATIENT NAME: Jacob Silva DOB: 01/22/1932 MRN: [id number]  PRIMARY CARE PROVIDER: [name], MD  RESPONSIBLE PARTY:  Acct ID - Guarantor Home Phone Work Phone Relationship Acct Type  [id number] RANARD, [phone]  Self P/F     [place], [place], [place] [address]     PLAN OF CARE and INTERVENTIONS:           Encounter: Follow up PC visit completed with [facility] RN, patient and spouse, Patient shared that he is not feeling too well today. Spouse shares usually this is related to patients breathing. Patient denies feelings of nausea, headache or chest pain. Patient continues with [facility] therapy.    Spouse shared that patients [facility] is needing recent cardio note indicating the start of anticoagulant eliquis and the reason for starting this. SW outreached Dr. Oleh Genin' office to request that patients last visit note from 08/22/22 be faxed to [facility] MD Dr. Josetta Huddle at F: [phone]. VM left for cardio RN.  SOCIAL HX:  Social History   Tobacco Use   Smoking status: Former    Types: Cigarettes    Quit date: 1975    Years since quitting: 49.1   Smokeless tobacco: Never  Substance Use Topics   Alcohol use: No    CODE STATUS: DNR ADVANCED DIRECTIVES: N MOST FORM COMPLETE: N HOSPICE EDUCATION PROVIDED: N        [place], [place]

## 2022-10-02 ENCOUNTER — Other Ambulatory Visit: Payer: Self-pay

## 2022-10-02 MED ORDER — FREESTYLE LIBRE 2 SENSOR MISC: 1.0000 | 2 refills | Status: DC

## 2022-10-08 ENCOUNTER — Other Ambulatory Visit: Payer: Medicare Other

## 2022-10-08 ENCOUNTER — Other Ambulatory Visit: Payer: Self-pay

## 2022-10-08 ENCOUNTER — Telehealth: Payer: Self-pay

## 2022-10-08 VITALS — BP 112/64 | HR 73 | Temp 97.5°F | Wt 194.0 lb

## 2022-10-08 DIAGNOSIS — Z515 Encounter for palliative care: Secondary | ICD-10-CM

## 2022-10-08 MED ORDER — FREESTYLE LIBRE 2 SENSOR MISC: 1.0000 | 2 refills | Status: DC

## 2022-10-08 NOTE — Progress Notes (Signed)
COMMUNITY PALLIATIVE CARE SW NOTE  PATIENT NAME: Jacob Silva DOB: 08-16-1931 MRN: [id number]  PRIMARY CARE PROVIDER: [name], MD  RESPONSIBLE PARTY:  Acct ID - Guarantor Home Phone Work Phone Relationship Acct Type  [id number] TYMIR, [phone]  Self P/F     [place], [place], [place] [address]     PLAN OF CARE and INTERVENTIONS:              Encounter: follow up palliative care visit done with RN Almyra Free B. Spouse present as well.  Patient share that his appetite is improving. Eating 1 1/2 meals a day and snacks, patient drinks boost and glucerna. Spouse share rash and small irritated area on bottom has improved. Patient share that he feels he is having some intolerance to ice cream, as to where is causes burring sensation in his stomach. Patient is taking pepto bismol for the symptoms. Spouse is encouraging more fluid intake as patient is taking 2 fluid pills in the day and 1 at night.  Hospice: services and eligibility discussed. Patient and spouse open to the services. Referral to be requested from PCP.   Patient continues to receive [facility] PT 1x/wk but feels he does not need the service any longer.  SOCIAL HX:  Social History   Tobacco Use   Smoking status: Former    Types: Cigarettes    Quit date: 1975    Years since quitting: 49.2   Smokeless tobacco: Never  Substance Use Topics   Alcohol use: No    CODE STATUS: DNR  ADVANCED DIRECTIVES: Y MOST FORM COMPLETE: N HOSPICE EDUCATION PROVIDED: Y  PPS: Patient requires assistance with bathing, dressing and meal prep.      Doreene Eland, [place]

## 2022-10-08 NOTE — Telephone Encounter (Signed)
Jacob Silva with [facility] called asking for an order for hospice and to see if you would continue to be his Attending Physician and if you want the Hospice to manage his comfort care  [phone]

## 2022-10-08 NOTE — Progress Notes (Signed)
PATIENT NAME: Jacob Silva DOB: 03-31-1932 MRN: [id number]  PRIMARY CARE PROVIDER: [name], MD  RESPONSIBLE PARTY:  Acct ID - Guarantor Home Phone Work Phone Relationship Acct Type  [id number] Jacob, [name]  Self P/F     [address] [place], [place], [place] [address]   Home visit completed with patient and wife.   ACP:  DNR now in the home.  Patient and wife verbalize desire to avoid hospitalizations and manage symptoms in the home.   Appetite:  Improving over the last couple of weeks.  Eating 1-2 meals a day.  Usually eating breakfast and dinner occasionally will skip lunch.  Heart Failure:  Weighing himself daily.  Maintaining around 194-195 lbs.  Will follow up with heart failure clinic Thursday.  Patient advised he took a metolazone about 1 week ago.  Wife advised he was holding fluid in the abdomen and had a weight gain of 5 lbs in one week.  She is questioning when to take the torsemide and metolazone.  Reviewed orders given by Darylene Price, NP on my last visit.   During the course of this conversation, patient then advised he went back up on his torsemide and is taking 40 mg in the am and 20 mg in the afternoon.   Home Health: Continues with PT 1 x weekly.  Nursing is no longer involved.   Hospice:  Discussion completed on hospice services with patient and wife.  They are agreeable to an evaluation.  Advised I would contact PCP for order and wife will be contacted by [facility].  210 pm.  Phone call made to PCP office and message left at the nurses station.    CODE STATUS: DNR-in the home  ADVANCED DIRECTIVES: Yes MOST FORM: No PPS: 40%   PHYSICAL EXAM:   VITALS: Today's Vitals   10/08/22 1231  BP: 112/64  Pulse: 73  Temp: (!) 97.5 F (36.4 C)  SpO2: 93%  Weight: 194 lb (88 kg)    LUNGS: expiratory wheezes bilaterally CARDIAC: Cor irreg, irreg RRR}  EXTREMITIES: 1 + left lower extremity edema SKIN: Skin color, texture, turgor  normal. No rashes or lesions or normal  NEURO: positive for gait problems       Lorenza Burton, RN

## 2022-10-09 NOTE — Telephone Encounter (Signed)
Spoke with julie and informed that we will remain the attending and they will administer comfort care

## 2022-10-10 ENCOUNTER — Encounter: Payer: Self-pay | Admitting: Family

## 2022-10-10 ENCOUNTER — Ambulatory Visit (INDEPENDENT_AMBULATORY_CARE_PROVIDER_SITE_OTHER): Payer: Medicare Other | Admitting: Cardiovascular Disease

## 2022-10-10 ENCOUNTER — Encounter: Payer: Self-pay | Admitting: Cardiovascular Disease

## 2022-10-10 ENCOUNTER — Ambulatory Visit: Payer: Medicare Other | Attending: Family | Admitting: Family

## 2022-10-10 VITALS — BP 106/55 | HR 92 | Wt 203.8 lb

## 2022-10-10 VITALS — BP 118/64 | HR 89 | Ht 71.0 in | Wt 204.8 lb

## 2022-10-10 DIAGNOSIS — N184 Chronic kidney disease, stage 4 (severe): Secondary | ICD-10-CM | POA: Diagnosis not present

## 2022-10-10 DIAGNOSIS — I272 Pulmonary hypertension, unspecified: Secondary | ICD-10-CM | POA: Insufficient documentation

## 2022-10-10 DIAGNOSIS — R0602 Shortness of breath: Secondary | ICD-10-CM | POA: Insufficient documentation

## 2022-10-10 DIAGNOSIS — Z7901 Long term (current) use of anticoagulants: Secondary | ICD-10-CM | POA: Insufficient documentation

## 2022-10-10 DIAGNOSIS — I10 Essential (primary) hypertension: Secondary | ICD-10-CM | POA: Diagnosis not present

## 2022-10-10 DIAGNOSIS — I480 Paroxysmal atrial fibrillation: Secondary | ICD-10-CM

## 2022-10-10 DIAGNOSIS — E1122 Type 2 diabetes mellitus with diabetic chronic kidney disease: Secondary | ICD-10-CM | POA: Diagnosis not present

## 2022-10-10 DIAGNOSIS — I5020 Unspecified systolic (congestive) heart failure: Secondary | ICD-10-CM

## 2022-10-10 DIAGNOSIS — Z951 Presence of aortocoronary bypass graft: Secondary | ICD-10-CM | POA: Insufficient documentation

## 2022-10-10 DIAGNOSIS — Z9981 Dependence on supplemental oxygen: Secondary | ICD-10-CM | POA: Diagnosis not present

## 2022-10-10 DIAGNOSIS — R5383 Other fatigue: Secondary | ICD-10-CM | POA: Diagnosis not present

## 2022-10-10 DIAGNOSIS — J449 Chronic obstructive pulmonary disease, unspecified: Secondary | ICD-10-CM | POA: Diagnosis not present

## 2022-10-10 DIAGNOSIS — J8410 Pulmonary fibrosis, unspecified: Secondary | ICD-10-CM | POA: Diagnosis not present

## 2022-10-10 DIAGNOSIS — I2510 Atherosclerotic heart disease of native coronary artery without angina pectoris: Secondary | ICD-10-CM

## 2022-10-10 DIAGNOSIS — I5082 Biventricular heart failure: Secondary | ICD-10-CM

## 2022-10-10 DIAGNOSIS — I5033 Acute on chronic diastolic (congestive) heart failure: Secondary | ICD-10-CM | POA: Diagnosis not present

## 2022-10-10 DIAGNOSIS — Z87891 Personal history of nicotine dependence: Secondary | ICD-10-CM | POA: Insufficient documentation

## 2022-10-10 DIAGNOSIS — Z79899 Other long term (current) drug therapy: Secondary | ICD-10-CM | POA: Insufficient documentation

## 2022-10-10 DIAGNOSIS — I5032 Chronic diastolic (congestive) heart failure: Secondary | ICD-10-CM | POA: Diagnosis not present

## 2022-10-10 DIAGNOSIS — E782 Mixed hyperlipidemia: Secondary | ICD-10-CM

## 2022-10-10 DIAGNOSIS — G4733 Obstructive sleep apnea (adult) (pediatric): Secondary | ICD-10-CM | POA: Insufficient documentation

## 2022-10-10 DIAGNOSIS — R12 Heartburn: Secondary | ICD-10-CM | POA: Insufficient documentation

## 2022-10-10 DIAGNOSIS — I130 Hypertensive heart and chronic kidney disease with heart failure and stage 1 through stage 4 chronic kidney disease, or unspecified chronic kidney disease: Secondary | ICD-10-CM | POA: Diagnosis not present

## 2022-10-10 DIAGNOSIS — Z7902 Long term (current) use of antithrombotics/antiplatelets: Secondary | ICD-10-CM | POA: Insufficient documentation

## 2022-10-10 DIAGNOSIS — I25810 Atherosclerosis of coronary artery bypass graft(s) without angina pectoris: Secondary | ICD-10-CM

## 2022-10-10 NOTE — Progress Notes (Signed)
Patient ID: Jacob Silva, male    DOB: 04/05/1932, 89 y.o.   MRN: [id number]  HPI  Jacob Silva is a 89 y.o. male with history of CKD stage 4, CAD status post CABG, cor pulmonale, pulmonary HTN, pulmonary fibrosis, chronic respiratory failure on home O2, COPD, Type 2 diabetes and OSA on CPAP.    Echo in 6/23 showed normal LVEF 55-60%, RV severely enlarged w/ severely reduced systolic function and severely elevated RVSP, 69 mmHg, severe TR, RA severely dilated. Only trivial MR. He has primarily WHO Group 3 PH from underlying lung disease (COPD, Pulmonary Fibrosis and OSA).  Admitted on 08/11/22 with CHF/cor pulmonale and diuresed.  ECGs from this admission were reviewed, he was noted to be in atrial fibrillation during that admission but has not been on anticoagulation.    Patient returns for followup of RV failure with a chief complaint of moderate SOB w/ minimal exertion. Chronic in nature. Has associated fatigue & pedal edema along with this. Denies dizziness, difficulty sleeping, abdominal distention, palpitations, chest pain, cough or weight gain.   Wearing oxygen at 2L when sitting but can turn it up to 3L upon exertion. No new nosebleeds since starting eliquis. Is having some heartburn symptoms for which he'Jacob taking pepto for (instructed to f/u with PCP regarding this)  Given his cardiac condition, continuous supplemental O2 requirements and fragility,  he requires 24 hr care from a in-home caregiver.   Past Medical History:  Diagnosis Date   CAD (coronary artery disease)    a. 2013 Jacob/p CABG x 3 (LIMA->dLAD, VG->RPDA, VG->OM1); b. 12/2021 NSTEMI (peak HsTrop 641)-->Med rx in setting of CKD IV. EF 55-60% w/ rwma by echo.   CHF (congestive heart failure) (HCC)    Chronic heart failure with preserved ejection fraction (HFpEF) ([place])    a. 12/2021 Echo: EF 55-60%, no rwma, sev RV dysfxn w/ RVSP 69.58mHg. Sev dil RA. Triv MR. Ao sclerosis.   CKD (chronic kidney disease), stage IV (HCC)     COPD (chronic obstructive pulmonary disease) ([facility]    a. Home O2.   Diabetes ([facility]    Essential hypertension    Hyperlipidemia LDL goal <70    [place]STEMI (non-ST elevated myocardial infarction) ([facility] 01/13/2022   OSA (obstructive sleep apnea)    Pulmonary fibrosis (HCC)    Pulmonary HTN (HCC)    Past Surgical History:  Procedure Laterality Date   BACK SURGERY     HERNIA REPAIR     KNEE SURGERY Right    TRIPLE BYPASS     Family History  Problem Relation Age of Onset   Diabetes Mother    Kidney disease Mother    Heart attack Brother    Throat cancer Brother    Social History   Tobacco Use   Smoking status: Former    Types: Cigarettes    Quit date: 1975    Years since quitting: 49.2   Smokeless tobacco: Never  Substance Use Topics   Alcohol use: No   Allergies  Allergen Reactions   Jardiance [Empagliflozin]    Penicillin G Other (See Comments)   Penicillins Swelling   Tramadol     Other reaction(Jacob): Dizziness   Prior to Admission medications   Medication Sig Start Date End Date Taking? Authorizing Provider  acetaminophen (TYLENOL) 325 MG tablet Take 650 mg by mouth every 6 (six) hours as needed for mild pain.   Yes [provider]  albuterol (VENTOLIN HFA) 108 (90 Base) MCG/ACT inhaler Inhale 1  puff into the lungs every 6 (six) hours as needed for wheezing or shortness of breath. 10/12/13  Yes [provider]  allopurinol (ZYLOPRIM) 100 MG tablet Take 100 mg by mouth daily.   Yes [provider]  apixaban (ELIQUIS) 2.5 MG TABS tablet Take 1 tablet (2.5 mg total) by mouth 2 (two) times daily. 09/19/22  Yes Darylene Price A, FNP  atorvastatin (LIPITOR) 80 MG tablet Take 40 mg by mouth at bedtime.   Yes [provider]  clopidogrel (PLAVIX) 75 MG tablet Take 75 mg by mouth daily.   Yes [provider]  clotrimazole-betamethasone (LOTRISONE) cream Apply 1 Application topically 2 (two) times daily. 09/10/22  Yes Edrick Kins, DPM   Continuous Blood Gluc Sensor (FREESTYLE LIBRE 2 SENSOR) MISC 1 Device by Does not apply route every 14 (fourteen) days. Apply device to arm every 2 weeks for blood sugar readings 10/08/22  Yes Jacob Silva, Jacob Loosen, MD  dapagliflozin propanediol (FARXIGA) 10 MG TABS tablet Take 10 mg by mouth daily. 03/14/21  Yes [provider]  diclofenac Sodium (VOLTAREN) 1 % GEL Apply 2 g topically 4 (four) times daily as needed (pain). 02/05/21  Yes [provider]  famotidine (PEPCID) 20 MG tablet Take 1 tablet (20 mg total) by mouth daily as needed for heartburn or indigestion. 02/19/22  Yes Oswald Hillock, MD  formoterol (PERFOROMIST) 20 MCG/2ML nebulizer solution USE 1 VIAL  IN  NEBULIZER TWICE  DAILY - morning and evening 09/20/22  Yes Jacob Silva, Jacob Ahmed, MD  gabapentin (NEURONTIN) 400 MG capsule Take 400 mg by mouth as directed. Take 2 capsules by mouth twice daily   Yes [provider]  levothyroxine (SYNTHROID) 100 MCG tablet Take 100 mcg by mouth every morning. 12/11/20  Yes [provider]  loperamide (IMODIUM) 2 MG capsule Take 2 mg by mouth daily as needed for diarrhea or loose stools. 03/13/20  Yes [provider]  loratadine (CLARITIN) 10 MG tablet Take 10 mg by mouth daily as needed for allergies.   Yes [provider]  metolazone (ZAROXOLYN) 2.5 MG tablet Take 2.5 mg by mouth as needed (edema).   Yes [provider]  Propylene Glycol (SYSTANE BALANCE) 0.6 % SOLN Place 1 drop into both eyes daily as needed (dry eyes).   Yes [provider]  torsemide (DEMADEX) 20 MG tablet Take 2 tablets AM and another tablet if needed in the PM 09/19/22  Yes Sammuel Blick A, FNP  umeclidinium-vilanterol (ANORO ELLIPTA) 62.5-25 MCG/INH AEPB Inhale 1 puff into the lungs daily.   Yes [provider]  gabapentin (NEURONTIN) 300 MG capsule Take 1 capsule (300 mg total) by mouth 2 (two) times daily. 08/16/22   Jacob Garfinkel, MD    Review of  Systems  Constitutional:  Positive for fatigue. Negative for appetite change.  HENT:  Negative for congestion, nosebleeds and sore throat.   Eyes: Negative.   Respiratory:  Positive for shortness of breath (easily). Negative for cough and chest tightness.   Cardiovascular:  Positive for leg swelling. Negative for chest pain and palpitations.  Gastrointestinal:  Negative for abdominal distention, abdominal pain and constipation.  Endocrine: Negative.   Genitourinary: Negative.   Musculoskeletal:  Negative for back pain and neck pain.  Skin: Negative.   Allergic/Immunologic: Negative.   Neurological:  Negative for dizziness and light-headedness.  Hematological:  Negative for adenopathy. Does not bruise/bleed easily.  Psychiatric/Behavioral:  Negative for dysphoric mood and sleep disturbance (sleeping with CPAP and 1  pillow). The patient is not nervous/anxious.    Vitals:   10/10/22 1125  BP: (!) 106/55  Pulse: 92  SpO2: 92%  Weight: 203 lb 12.8 oz (92.4 kg)   Wt Readings from Last 3 Encounters:  10/10/22 203 lb 12.8 oz (92.4 kg)  10/10/22 204 lb 12.8 oz (92.9 kg)  10/08/22 194 lb (88 kg)   Lab Results  Component Value Date   CREATININE 2.93 (H) 09/12/2022   CREATININE 2.63 (H) 08/22/2022   CREATININE 2.49 (H) 08/16/2022   Physical Exam Vitals and nursing note reviewed. Exam conducted with a chaperone present (wife).  Constitutional:      Appearance: Normal appearance.  HENT:     Head: Normocephalic and atraumatic.  Cardiovascular:     Rate and Rhythm: Normal rate and regular rhythm.  Pulmonary:     Effort: Pulmonary effort is normal. No respiratory distress.     Breath sounds: Rales (RLL) present. No wheezing or rhonchi.  Abdominal:     General: There is no distension.     Palpations: Abdomen is soft.     Tenderness: There is no abdominal tenderness.  Musculoskeletal:        General: No tenderness.     Cervical back: Normal range of motion and neck supple.     Right  lower leg: Edema (1+ pitting) present.     Left lower leg: Edema (2+ pitting) present.  Skin:    General: Skin is warm and dry.  Neurological:     General: No focal deficit present.     Mental Status: He is alert and oriented to person, place, and time.  Psychiatric:        Mood and Affect: Mood normal.        Behavior: Behavior normal.   ASSESSMENT & PLAN:  1: Chronic RV failure with pulmonary hypertension, Cor pulmonale in setting of severe lung disease- - NYHA class III - euvolemic - weighing daily; reminded to call for overnight weight gain of > 2 pounds or a weekly weight gain of > 5 pounds - weight down 4 pounds from last visit 1 month ago - Farxiga 10 mg daily  - saw [facility] provider Jacob Silva) 08/22/22 - BNP 08/22/22 was 683.2  2: HTN with CKD - BP 106/55 - to see PCP (Jacob Silva) 10/16/22 - BMP 09/12/22 showed sodium 148, potassium 4.3, creatinine 2.93 & GFR 20  3:COPD- - Remote smoker - oxygen at 2.5-3L at home - does not want to go to a pulmonologist.  - palliative care visit done 09/25/22  4: Pulmonary fibrosis- - history of heavy Agent Orange exposure - does not want to see a pulmonologist or have further pulmonary workup.  - continue home oxygen.   5: OSA- - continue CPAP nightly  6: CAD- - Jacob/p CABG in 2013 - clopidogrel '75mg'$  daily - atorvastatin 80 mg daily - LDL 08/22/22 was 27  7: Paroxsymal Atrial fibrillation-   - EKG today: AF - saw cardiology Humphrey Rolls) earlier today 10/10/22 - eliquis 2.'5mg'$  BID   Medication list reviewed.   Return in 2 months, sooner if needed.

## 2022-10-10 NOTE — Assessment & Plan Note (Signed)
Patient continues to be short of breath. On 3 L O2. Denies chest pain. Taking torsemide 20 mg two in the morning and one in the afternoon as needed. No changes today.

## 2022-10-10 NOTE — Progress Notes (Signed)
Cardiology Office Note   Date:  10/10/2022   ID:  Jacob Silva, DOB Apr 19, 1932, MRN [id number]  PCP:  [name], MD  Cardiologist:  Neoma Laming, MD      History of Present Illness: Jacob Silva is a 89 y.o. male who presents for  Chief Complaint  Patient presents with   Follow-up    4 week follow up    Patient in office for 1 month follow up. Denies chest pain. Complains of shortness of breath, On 3 L O2. Lower extremity edema improved.     Past Medical History:  Diagnosis Date   CAD (coronary artery disease)    a. 2013 s/p CABG x 3 (LIMA->dLAD, VG->RPDA, VG->OM1); b. 12/2021 NSTEMI (peak HsTrop 641)-->Med rx in setting of CKD IV. EF 55-60% w/ rwma by echo.   CHF (congestive heart failure) (HCC)    Chronic heart failure with preserved ejection fraction (HFpEF) ([place])    a. 12/2021 Echo: EF 55-60%, no rwma, sev RV dysfxn w/ RVSP 69.70mHg. Sev dil RA. Triv MR. Ao sclerosis.   CKD (chronic kidney disease), stage IV (HCC)    COPD (chronic obstructive pulmonary disease) ([facility]    a. Home O2.   Diabetes ([facility]    Essential hypertension    Hyperlipidemia LDL goal <70    NSTEMI (non-ST elevated myocardial infarction) ([facility] 01/13/2022   OSA (obstructive sleep apnea)    Pulmonary fibrosis (HCC)    Pulmonary HTN (HCC)      Past Surgical History:  Procedure Laterality Date   BACK SURGERY     HERNIA REPAIR     KNEE SURGERY Right    TRIPLE BYPASS       Current Outpatient Medications  Medication Sig Dispense Refill   acetaminophen (TYLENOL) 325 MG tablet Take 650 mg by mouth every 6 (six) hours as needed for mild pain.     albuterol (VENTOLIN HFA) 108 (90 Base) MCG/ACT inhaler Inhale 1 puff into the lungs every 6 (six) hours as needed for wheezing or shortness of breath.     allopurinol (ZYLOPRIM) 100 MG tablet Take 100 mg by mouth daily.     apixaban (ELIQUIS) 2.5 MG TABS tablet Take 1 tablet (2.5 mg total) by mouth 2 (two) times daily. 60 tablet 5    atorvastatin (LIPITOR) 80 MG tablet Take 40 mg by mouth at bedtime.     clopidogrel (PLAVIX) 75 MG tablet Take 75 mg by mouth daily.     clotrimazole-betamethasone (LOTRISONE) cream Apply 1 Application topically 2 (two) times daily. 45 g 3   Continuous Blood Gluc Sensor (FREESTYLE LIBRE 2 SENSOR) MISC 1 Device by Does not apply route every 14 (fourteen) days. Apply device to arm every 2 weeks for blood sugar readings 2 each 2   dapagliflozin propanediol (FARXIGA) 10 MG TABS tablet Take 10 mg by mouth daily.     diclofenac Sodium (VOLTAREN) 1 % GEL Apply 2 g topically 4 (four) times daily as needed (pain).     famotidine (PEPCID) 20 MG tablet Take 1 tablet (20 mg total) by mouth daily as needed for heartburn or indigestion. 30 tablet 2   formoterol (PERFOROMIST) 20 MCG/2ML nebulizer solution USE 1 VIAL  IN  NEBULIZER TWICE  DAILY - morning and evening 60 mL 11   gabapentin (NEURONTIN) 300 MG capsule Take 1 capsule (300 mg total) by mouth 2 (two) times daily. 60 capsule 0   gabapentin (NEURONTIN) 400 MG capsule Take 400 mg by mouth as  directed. Take 2 capsules by mouth twice daily     levothyroxine (SYNTHROID) 100 MCG tablet Take 100 mcg by mouth every morning.     loperamide (IMODIUM) 2 MG capsule Take 2 mg by mouth daily as needed for diarrhea or loose stools.     loratadine (CLARITIN) 10 MG tablet Take 10 mg by mouth daily as needed for allergies.     metolazone (ZAROXOLYN) 2.5 MG tablet Take 2.5 mg by mouth as needed (edema).     Propylene Glycol (SYSTANE BALANCE) 0.6 % SOLN Place 1 drop into both eyes daily as needed (dry eyes).     torsemide (DEMADEX) 20 MG tablet Take 2 tablets AM and another tablet if needed in the PM 150 tablet 3   umeclidinium-vilanterol (ANORO ELLIPTA) 62.5-25 MCG/INH AEPB Inhale 1 puff into the lungs daily.     No current facility-administered medications for this visit.    Allergies:   Jardiance [empagliflozin], Penicillin g, Penicillins, and Tramadol    Social  History:   reports that he quit smoking about 49 years ago. His smoking use included cigarettes. He has never used smokeless tobacco. He reports that he does not drink alcohol and does not use drugs.   Family History:  family history includes Diabetes in his mother; Heart attack in his brother; Kidney disease in his mother; Throat cancer in his brother.    ROS:     Review of Systems  Constitutional: Negative.   HENT: Negative.    Eyes: Negative.   Respiratory:  Positive for shortness of breath.   Cardiovascular: Negative.   Gastrointestinal: Negative.   Genitourinary: Negative.   Musculoskeletal: Negative.   Skin: Negative.   Neurological: Negative.   Endo/Heme/Allergies: Negative.   Psychiatric/Behavioral: Negative.    All other systems reviewed and are negative.   All other systems are reviewed and negative.   PHYSICAL EXAM: VS:  BP 118/64   Pulse 89   Ht '5\' 11"'$  (1.803 m)   Wt 204 lb 12.8 oz (92.9 kg)   SpO2 (!) 82% Comment: 3L  BMI 28.56 kg/m  , BMI Body mass index is 28.56 kg/m. Last weight:  Wt Readings from Last 3 Encounters:  10/10/22 204 lb 12.8 oz (92.9 kg)  10/08/22 194 lb (88 kg)  09/25/22 195 lb (88.5 kg)   Physical Exam Vitals reviewed.  Constitutional:      Appearance: Normal appearance. He is normal weight.  HENT:     Head: Normocephalic.     Nose: Nose normal.     Mouth/Throat:     Mouth: Mucous membranes are moist.  Eyes:     Pupils: Pupils are equal, round, and reactive to light.  Cardiovascular:     Rate and Rhythm: Normal rate and regular rhythm.     Pulses: Normal pulses.     Heart sounds: Normal heart sounds.  Pulmonary:     Effort: Pulmonary effort is normal.  Abdominal:     General: Abdomen is flat. Bowel sounds are normal.  Musculoskeletal:        General: Normal range of motion.     Cervical back: Normal range of motion.  Skin:    General: Skin is warm.  Neurological:     General: No focal deficit present.     Mental Status: He  is alert.  Psychiatric:        Mood and Affect: Mood normal.      EKG: none today  Recent Labs: 02/15/2022: ALT 19 08/11/2022: Magnesium 2.4 08/16/2022:  Hemoglobin 10.8; Platelets 170 08/22/2022: B Natriuretic Peptide 683.2 09/12/2022: BUN 39; Creatinine, Ser 2.93; Potassium 4.3; Sodium 148    Lipid Panel    Component Value Date/Time   CHOL 76 08/22/2022 1120   TRIG 44 08/22/2022 1120   HDL 40 (L) 08/22/2022 1120   CHOLHDL 1.9 08/22/2022 1120   VLDL 9 08/22/2022 1120   LDLCALC 27 08/22/2022 1120      ASSESSMENT AND PLAN:    ICD-10-CM   1. Acute on chronic diastolic CHF (congestive heart failure) (HCC)  I50.33     2. 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 Paroxysmal atrial fibrillation (HCC)  I48.0     4. Coronary artery disease involving native coronary artery of native heart without angina pectoris  I25.10     5. Essential (primary) hypertension  I10     6. Primary hypertension  I10     7. Mixed hyperlipidemia  E78.2        Problem List Items Addressed This Visit       Cardiovascular and Mediastinum   CAD (coronary artery disease) (Chronic)   Relevant Medications   metolazone (ZAROXOLYN) 2.5 MG tablet   HTN (hypertension) (Chronic)   Relevant Medications   metolazone (ZAROXOLYN) 2.5 MG tablet   Acute on chronic diastolic CHF (congestive heart failure) (HCC) - Primary   Relevant Medications   metolazone (ZAROXOLYN) 2.5 MG tablet   Atherosclerosis of coronary artery bypass graft(s) without angina pectoris   Relevant Medications   metolazone (ZAROXOLYN) 2.5 MG tablet   Atrial fibrillation (HCC)   Relevant Medications   metolazone (ZAROXOLYN) 2.5 MG tablet   Essential (primary) hypertension   Relevant Medications   metolazone (ZAROXOLYN) 2.5 MG tablet     Other   HLD (hyperlipidemia)   Relevant Medications   metolazone (ZAROXOLYN) 2.5 MG tablet    Disposition:   No follow-ups on file.     Total time spent: 30 minutes  Signed,  Neoma Laming, MD  10/10/2022 10:40 AM    [facility]

## 2022-10-14 ENCOUNTER — Other Ambulatory Visit: Payer: Medicare Other

## 2022-10-14 ENCOUNTER — Other Ambulatory Visit: Payer: Self-pay | Admitting: Internal Medicine

## 2022-10-15 LAB — COMPREHENSIVE METABOLIC PANEL
ALT: 8 IU/L (ref 0–44)
AST: 11 IU/L (ref 0–40)
Albumin/Globulin Ratio: 1.4 (ref 1.2–2.2)
Albumin: 4 g/dL (ref 3.7–4.7)
Alkaline Phosphatase: 132 IU/L — ABNORMAL HIGH (ref 44–121)
BUN/Creatinine Ratio: 12 (ref 10–24)
BUN: 32 mg/dL — ABNORMAL HIGH (ref 8–27)
Bilirubin Total: 0.8 mg/dL (ref 0.0–1.2)
CO2: 28 mmol/L (ref 20–29)
Calcium: 9.4 mg/dL (ref 8.6–10.2)
Chloride: 101 mmol/L (ref 96–106)
Creatinine, Ser: 2.62 mg/dL — ABNORMAL HIGH (ref 0.76–1.27)
Globulin, Total: 2.8 g/dL (ref 1.5–4.5)
Glucose: 61 mg/dL — ABNORMAL LOW (ref 70–99)
Potassium: 4.4 mmol/L (ref 3.5–5.2)
Sodium: 147 mmol/L — ABNORMAL HIGH (ref 134–144)
Total Protein: 6.8 g/dL (ref 6.0–8.5)
eGFR: 23 mL/min/{1.73_m2} — ABNORMAL LOW (ref >59)

## 2022-10-15 LAB — LIPID PANEL W/O CHOL/HDL RATIO
Cholesterol, Total: 77 mg/dL — ABNORMAL LOW (ref 100–199)
HDL: 46 mg/dL (ref >39)
LDL Chol Calc (NIH): 17 mg/dL (ref 0–99)
Triglycerides: 56 mg/dL (ref 0–149)
VLDL Cholesterol Cal: 14 mg/dL (ref 5–40)

## 2022-10-15 LAB — HGB A1C W/O EAG: Hgb A1c MFr Bld: 6.3 % — ABNORMAL HIGH (ref 4.8–5.6)

## 2022-10-16 ENCOUNTER — Ambulatory Visit: Payer: Medicare Other | Admitting: Internal Medicine

## 2022-10-16 ENCOUNTER — Encounter: Payer: Self-pay | Admitting: Internal Medicine

## 2022-10-16 VITALS — BP 120/58 | HR 128 | Temp 97.7°F | Ht 71.0 in | Wt 205.0 lb

## 2022-10-16 DIAGNOSIS — E1122 Type 2 diabetes mellitus with diabetic chronic kidney disease: Secondary | ICD-10-CM

## 2022-10-16 DIAGNOSIS — Z794 Long term (current) use of insulin: Secondary | ICD-10-CM

## 2022-10-16 DIAGNOSIS — I10 Essential (primary) hypertension: Secondary | ICD-10-CM

## 2022-10-16 DIAGNOSIS — I2510 Atherosclerotic heart disease of native coronary artery without angina pectoris: Secondary | ICD-10-CM | POA: Diagnosis not present

## 2022-10-16 DIAGNOSIS — N184 Chronic kidney disease, stage 4 (severe): Secondary | ICD-10-CM | POA: Diagnosis not present

## 2022-10-16 MED ORDER — FREESTYLE LIBRE 3 SENSOR MISC: 1.0000 | 5 refills | Status: AC | PRN

## 2022-10-16 NOTE — Progress Notes (Signed)
Established Patient Office Visit  Subjective:  Patient ID: Jacob Silva, male    DOB: November 19, 1931  Age: 89 y.o. MRN: [id number]  Chief Complaint  Patient presents with   Follow-up    7 week follow up    No new complaints, here for lab review and medication refills. Recently transitioned to hospice care, denies worsening of SOB or decreased exercise capacity. Recently underwent a 6 wk session of home PT with improvement in his strength and ambulation. No recent chest pain. Labs reviewed and notable for well controlled diabetes and LDL but sodium remains elevated although slightly improved. Had a single episode of hypoglycemia requiring administration of Gvoke.    No other concerns at this time.   Past Medical History:  Diagnosis Date   CAD (coronary artery disease)    a. 2013 Khara Renaud/p CABG x 3 (LIMA->dLAD, VG->RPDA, VG->OM1); b. 12/2021 NSTEMI (peak HsTrop 641)-->Med rx in setting of CKD IV. EF 55-60% w/ rwma by echo.   CHF (congestive heart failure) (HCC)    Chronic heart failure with preserved ejection fraction (HFpEF) ([place])    a. 12/2021 Echo: EF 55-60%, no rwma, sev RV dysfxn w/ RVSP 69.59mmHg. Sev dil RA. Triv MR. Ao sclerosis.   CKD (chronic kidney disease), stage IV (HCC)    COPD (chronic obstructive pulmonary disease) ([place])    a. Home O2.   Diabetes ([place])    Essential hypertension    Hyperlipidemia LDL goal <70    NSTEMI (non-ST elevated myocardial infarction) ([place]) 01/13/2022   OSA (obstructive sleep apnea)    Pulmonary fibrosis (HCC)    Pulmonary HTN (HCC)     Past Surgical History:  Procedure Laterality Date   BACK SURGERY     HERNIA REPAIR     KNEE SURGERY Right    TRIPLE BYPASS      Social History   Socioeconomic History   Marital status: Married    Spouse name: Tye [place]   Number of children: 3   Years of education: Not on file   Highest education level: Doctorate  Occupational History   Occupation: Retired    Comment: Nature conservation officer  Tobacco Use   Smoking  status: Former    Types: Cigarettes    Quit date: 1975    Years since quitting: 49.2   Smokeless tobacco: Never  Vaping Use   Vaping Use: Never used  Substance and Sexual Activity   Alcohol use: No   Drug use: No   Sexual activity: Not on file  Other Topics Concern   Not on file  Social History Narrative   Lives in [place] with his wife.  Does not routinely exercise.   Social Determinants of Health   Financial Resource Strain: Low Risk  (02/19/2022)   Overall Financial Resource Strain (CARDIA)    Difficulty of Paying Living Expenses: Not very hard  Food Insecurity: No Food Insecurity (08/12/2022)   Hunger Vital Sign    Worried About Running Out of Food in the Last Year: Never true    Ran Out of Food in the Last Year: Never true  Transportation Needs: No Transportation Needs (08/12/2022)   PRAPARE - Hydrologist (Medical): No    Lack of Transportation (Non-Medical): No  Physical Activity: Not on file  Stress: Not on file  Social Connections: Not on file  Intimate Partner Violence: Not At Risk (08/12/2022)   Humiliation, Afraid, Rape, and Kick questionnaire    Fear of Current or Ex-Partner: No  Emotionally Abused: No    Physically Abused: No    Sexually Abused: No    Family History  Problem Relation Age of Onset   Diabetes Mother    Kidney disease Mother    Heart attack Brother    Throat cancer Brother     Allergies  Allergen Reactions   Jardiance [Empagliflozin]    Penicillin G Other (See Comments)   Penicillins Swelling   Tramadol     Other reaction([place]is): Dizziness    Review of Systems  All other systems reviewed and are negative.      Objective:   BP (!) 120/58   Pulse (!) 128   Temp 97.7 F (36.5 C) (Tympanic)   Ht 5\' 11"  (1.803 m)   Wt 205 lb (93 kg) Comment: 130  SpO2 (!) 86% Comment: 3L  BMI 28.59 kg/m   Vitals:   10/16/22 1010  BP: (!) 120/58  Pulse: (!) 128  Temp: 97.7 F (36.5 C)  Height: 5\' 11"  (1.803  m)  Weight: 205 lb (93 kg) Comment: 130  SpO2: (!) 86% Comment: 3L  TempSrc: Tympanic  BMI (Calculated): 28.6    Physical Exam Vitals reviewed.  Constitutional:      Appearance: Normal appearance.  HENT:     Head: Normocephalic.     Left Ear: There is no impacted cerumen.     Nose: Nose normal.     Mouth/Throat:     Mouth: Mucous membranes are moist.     Pharynx: No posterior oropharyngeal erythema.  Eyes:     Extraocular Movements: Extraocular movements intact.     Pupils: Pupils are equal, round, and reactive to light.  Cardiovascular:     Rate and Rhythm: Regular rhythm.     Chest Wall: PMI is not displaced.     Pulses: Normal pulses.     Heart sounds: Normal heart sounds. No murmur heard. Pulmonary:     Effort: Pulmonary effort is normal.     Breath sounds: Normal air entry. Examination of the right-lower field reveals rales. Examination of the left-lower field reveals rales. Rhonchi and rales present.  Abdominal:     General: Abdomen is flat. Bowel sounds are normal. There is no distension.     Palpations: Abdomen is soft. There is no hepatomegaly, splenomegaly or mass.     Tenderness: There is no abdominal tenderness.  Musculoskeletal:        General: Normal range of motion.     Cervical back: Normal range of motion and neck supple.     Right lower leg: No edema.     Left lower leg: No edema.  Skin:    General: Skin is warm and dry.  Neurological:     General: No focal deficit present.     Mental Status: He is alert and oriented to person, place, and time.     Cranial Nerves: No cranial nerve deficit.     Motor: No weakness.  Psychiatric:        Mood and Affect: Mood normal.        Behavior: Behavior normal.      No results found for any visits on 10/16/22.  Recent Results (from the past 2160 hour([place]))  Basic metabolic panel     Status: Abnormal   Collection Time: 08/11/22 11:33 AM  Result Value Ref Range   Sodium 138 135 - 145 mmol/L   Potassium 3.8  3.5 - 5.1 mmol/L   Chloride 98 98 - 111 mmol/L   CO2  31 22 - 32 mmol/L   Glucose, Bld 117 (H) 70 - 99 mg/dL    Comment: Glucose reference range applies only to samples taken after fasting for at least 8 hours.   BUN 34 (H) 8 - 23 mg/dL   Creatinine, Ser 2.68 (H) 0.61 - 1.24 mg/dL   Calcium 8.7 (L) 8.9 - 10.3 mg/dL   GFR, Estimated 22 (L) >60 mL/min    Comment: (NOTE) Calculated using the CKD-EPI Creatinine Equation (2021)    Anion gap 9 5 - 15    Comment: Performed at [facility], [address] [place], [place] [address]  CBC     Status: Abnormal   Collection Time: 08/11/22 11:33 AM  Result Value Ref Range   WBC 11.4 (H) 4.0 - 10.5 K/uL   RBC 4.93 4.22 - 5.81 MIL/uL   Hemoglobin 12.0 (L) 13.0 - 17.0 g/dL   HCT 37.6 (L) 39.0 - 52.0 %   MCV 76.3 (L) 80.0 - 100.0 fL   MCH 24.3 (L) 26.0 - 34.0 pg   MCHC 31.9 30.0 - 36.0 g/dL   RDW 18.3 (H) 11.5 - 15.5 %   Platelets 194 150 - 400 K/uL   nRBC 0.0 0.0 - 0.2 %    Comment: Performed at [facility], [place], [place] [address]  Troponin I (High Sensitivity)     Status: Abnormal   Collection Time: 08/11/22 11:33 AM  Result Value Ref Range   Troponin I (High Sensitivity) 23 (H) <18 ng/L    Comment: (NOTE) Elevated high sensitivity troponin I (hsTnI) values and significant  changes across serial measurements may suggest ACS but many other  chronic and acute conditions are known to elevate hsTnI results.  Refer to the "Links" section for chest pain algorithms and additional  guidance. Performed at [facility], [address] [place], [place] [address]   Brain natriuretic peptide     Status: Abnormal   Collection Time: 08/11/22 11:33 AM  Result Value Ref Range   B Natriuretic Peptide 884.1 (H) 0.0 - 100.0 pg/mL    Comment: Performed at [facility], [address] [place], [place] [address]  Procalcitonin - Baseline     Status: None   Collection Time: 08/11/22 11:33 AM   Result Value Ref Range   Procalcitonin 2.87 ng/mL    Comment:        Interpretation: PCT > 2 ng/mL: Systemic infection (sepsis) is likely, unless other causes are known. (NOTE)       Sepsis PCT Algorithm           Lower Respiratory Tract                                      Infection PCT Algorithm    ----------------------------     ----------------------------         PCT < 0.25 ng/mL                PCT < 0.10 ng/mL          Strongly encourage             Strongly discourage   discontinuation of antibiotics    initiation of antibiotics    ----------------------------     -----------------------------       PCT 0.25 - 0.50 ng/mL            PCT 0.10 -  0.25 ng/mL               OR       >80% decrease in PCT            Discourage initiation of                                            antibiotics      Encourage discontinuation           of antibiotics    ----------------------------     -----------------------------         PCT >= 0.50 ng/mL              PCT 0.26 - 0.50 ng/mL               AND       <80% decrease in PCT              Encourage initiation of                                             antibiotics       Encourage continuation           of antibiotics    ----------------------------     -----------------------------        PCT >= 0.50 ng/mL                  PCT > 0.50 ng/mL               AND         increase in PCT                  Strongly encourage                                      initiation of antibiotics    Strongly encourage escalation           of antibiotics                                     -----------------------------                                           PCT <= 0.25 ng/mL                                                 OR                                        > 80% decrease in PCT  Discontinue / Do not initiate                                             antibiotics  Performed at [facility],  [place], [place] [address]   Troponin I (High Sensitivity)     Status: Abnormal   Collection Time: 08/11/22  1:31 PM  Result Value Ref Range   Troponin I (High Sensitivity) 23 (H) <18 ng/L    Comment: (NOTE) Elevated high sensitivity troponin I (hsTnI) values and significant  changes across serial measurements may suggest ACS but many other  chronic and acute conditions are known to elevate hsTnI results.  Refer to the "Links" section for chest pain algorithms and additional  guidance. Performed at [facility], [address] [place], [place] [address]   Magnesium     Status: None   Collection Time: 08/11/22  1:31 PM  Result Value Ref Range   Magnesium 2.4 1.7 - 2.4 mg/dL    Comment: Performed at [facility], [address] [place], [place] [address]  Blood culture (routine x 2)     Status: None   Collection Time: 08/11/22  2:49 PM   Specimen: BLOOD RIGHT ARM  Result Value Ref Range   Specimen Description BLOOD RIGHT ARM    Special Requests      BOTTLES DRAWN AEROBIC AND ANAEROBIC Blood Culture adequate volume   Culture      NO GROWTH 5 DAYS Performed at [facility], [address] [place], [place] [address]    Report Status 08/16/2022 FINAL   Blood culture (routine x 2)     Status: None   Collection Time: 08/11/22  2:54 PM   Specimen: Right Antecubital; Blood  Result Value Ref Range   Specimen Description RIGHT ANTECUBITAL    Special Requests      BOTTLES DRAWN AEROBIC AND ANAEROBIC Blood Culture adequate volume   Culture      NO GROWTH 5 DAYS Performed at [facility], [address] [place], [place] [address]    Report Status 08/16/2022 FINAL   CBG monitoring, ED     Status: Abnormal   Collection Time: 08/11/22  4:27 PM  Result Value Ref Range   Glucose-Capillary 109 (H) 70 - 99 mg/dL    Comment: Glucose reference range applies only to samples taken after fasting for at least 8 hours.  Resp panel by  RT-PCR (RSV, Flu A&B, Covid) Anterior Nasal Swab     Status: None   Collection Time: 08/11/22  4:46 PM   Specimen: Anterior Nasal Swab  Result Value Ref Range   SARS Coronavirus 2 by RT PCR NEGATIVE NEGATIVE    Comment: (NOTE) SARS-CoV-2 target nucleic acids are NOT DETECTED.  The SARS-CoV-2 RNA is generally detectable in upper respiratory specimens during the acute phase of infection. The lowest concentration of SARS-CoV-2 viral copies this assay can detect is 138 copies/mL. A negative result does not preclude SARS-Cov-2 infection and should not be used as the sole basis for treatment or other patient management decisions. A negative result may occur with  improper specimen collection/handling, submission of specimen other than nasopharyngeal swab, presence of viral mutation(Tyana Butzer) within the areas targeted by this assay, and inadequate number of viral copies(<138 copies/mL). A negative result must be combined with clinical observations, patient history, and epidemiological information. The  expected result is Negative.  Fact Sheet for Patients:  EntrepreneurPulse.com.au  Fact Sheet for Healthcare Providers:  IncredibleEmployment.be  This test is no t yet approved or cleared by the [place] FDA and  has been authorized for detection and/or diagnosis of SARS-CoV-2 by FDA under an Emergency Use Authorization (EUA). This EUA will remain  in effect (meaning this test can be used) for the duration of the COVID-19 declaration under Section 564(b)(1) of the Act, 21 U.Gissel Keilman.C.section 360bbb-3(b)(1), unless the authorization is terminated  or revoked sooner.       Influenza A by PCR NEGATIVE NEGATIVE   Influenza B by PCR NEGATIVE NEGATIVE    Comment: (NOTE) The Xpert Xpress SARS-CoV-2/FLU/RSV plus assay is intended as an aid in the diagnosis of influenza from Nasopharyngeal swab specimens and should not be used as a sole basis for treatment. Nasal  washings and aspirates are unacceptable for Xpert Xpress SARS-CoV-2/FLU/RSV testing.  Fact Sheet for Patients: EntrepreneurPulse.com.au  Fact Sheet for Healthcare Providers: IncredibleEmployment.be  This test is not yet approved or cleared by the [place] FDA and has been authorized for detection and/or diagnosis of SARS-CoV-2 by FDA under an Emergency Use Authorization (EUA). This EUA will remain in effect (meaning this test can be used) for the duration of the COVID-19 declaration under Section 564(b)(1) of the Act, 21 U.Elvie Maines.C. section 360bbb-3(b)(1), unless the authorization is terminated or revoked.     Resp Syncytial Virus by PCR NEGATIVE NEGATIVE    Comment: (NOTE) Fact Sheet for Patients: EntrepreneurPulse.com.au  Fact Sheet for Healthcare Providers: IncredibleEmployment.be  This test is not yet approved or cleared by the [place] FDA and has been authorized for detection and/or diagnosis of SARS-CoV-2 by FDA under an Emergency Use Authorization (EUA). This EUA will remain in effect (meaning this test can be used) for the duration of the COVID-19 declaration under Section 564(b)(1) of the Act, 21 U.Gabbriella Presswood.C. section 360bbb-3(b)(1), unless the authorization is terminated or revoked.  Performed at [facility], [address] [place], [place] [address]   Strep pneumoniae urinary antigen     Status: None   Collection Time: 08/11/22  4:46 PM  Result Value Ref Range   Strep Pneumo Urinary Antigen NEGATIVE NEGATIVE    Comment:        Infection due to Phaedra Colgate. pneumoniae cannot be absolutely ruled out since the antigen present may be below the detection limit of the test. Performed at [facility], [address], [place], [place] [address]   Legionella Pneumophila Serogp 1 Ur Ag     Status: None   Collection Time: 08/11/22  4:46 PM  Result Value Ref Range   L. pneumophila Serogp 1  Ur Ag Negative Negative    Comment: (NOTE) Presumptive negative for L. pneumophila serogroup 1 antigen in urine, suggesting no recent or current infection. Legionnaires' disease cannot be ruled out since other serogroups and species may also cause disease. Performed At: [facility] [place], [place] [id number] Rush Farmer MD [id number]    Source of Sample URINE     Comment: Performed at [facility], [address] [place], [place] [address]  CBG monitoring, ED     Status: None   Collection Time: 08/11/22  8:23 PM  Result Value Ref Range   Glucose-Capillary 97 70 - 99 mg/dL    Comment: Glucose reference range applies only to samples taken after fasting for at least 8 hours.  Basic metabolic panel  Status: None   Collection Time: 08/12/22  4:14 AM  Result Value Ref Range   Sodium SPECIMEN CONTAMINATED, UNABLE TO PERFORM TEST(Clevester Helzer). 135 - 145 mmol/L    Comment: NOTIFIED MARY MINTON AT 1000 08/12/22 DAS CORRECTED ON 01/15 AT 1105: PREVIOUSLY REPORTED AS 136    Potassium SPECIMEN CONTAMINATED, UNABLE TO PERFORM TEST(Giankarlo Leamer). 3.5 - 5.1 mmol/L    Comment: CORRECTED ON 01/15 AT 1105: PREVIOUSLY REPORTED AS 4.7   Chloride SPECIMEN CONTAMINATED, UNABLE TO PERFORM TEST(Jayleene Glaeser). 98 - 111 mmol/L    Comment: CORRECTED ON 01/15 AT 1105: PREVIOUSLY REPORTED AS 92   CO2 SPECIMEN CONTAMINATED, UNABLE TO PERFORM TEST(Deshanta Lady). 22 - 32 mmol/L    Comment: CORRECTED ON 01/15 AT 1105: PREVIOUSLY REPORTED AS 34   Glucose, Bld SPECIMEN CONTAMINATED, UNABLE TO PERFORM TEST(Kendell Sagraves). 70 - 99 mg/dL    Comment: Glucose reference range applies only to samples taken after fasting for at least 8 hours. CORRECTED ON 01/15 AT 1105: PREVIOUSLY REPORTED AS 109 Glucose reference range applies only to samples taken after fasting for at least 8 hours.    BUN SPECIMEN CONTAMINATED, UNABLE TO PERFORM TEST(Kidada Ging). 8 - 23 mg/dL    Comment: CORRECTED ON 01/15 AT 1105: PREVIOUSLY REPORTED AS 27   Creatinine,  Ser SPECIMEN CONTAMINATED, UNABLE TO PERFORM TEST(Shaquila Sigman). 0.61 - 1.24 mg/dL    Comment: CORRECTED ON 01/15 AT 1105: PREVIOUSLY REPORTED AS 0.79 RESULTS VERIFIED BY REPEAT TESTING DLB   Calcium SPECIMEN CONTAMINATED, UNABLE TO PERFORM TEST(Shaunice Levitan). 8.9 - 10.3 mg/dL    Comment: CORRECTED ON 01/15 AT 1105: PREVIOUSLY REPORTED AS 8.7   GFR, Estimated SPECIMEN CONTAMINATED, UNABLE TO PERFORM TEST(Eldrige Pitkin). >60 mL/min    Comment: (NOTE) Calculated using the CKD-EPI Creatinine Equation (2021) CORRECTED ON 01/15 AT 1105: PREVIOUSLY REPORTED AS >60    Anion gap SPECIMEN CONTAMINATED, UNABLE TO PERFORM TEST(Delroy Ordway). 5 - 15    Comment: Performed at [facility], [address] [place], [place] [address] CORRECTED ON 01/15 AT 1105: PREVIOUSLY REPORTED AS 10   CBC     Status: Abnormal   Collection Time: 08/12/22  4:14 AM  Result Value Ref Range   WBC 10.4 4.0 - 10.5 K/uL   RBC 4.58 4.22 - 5.81 MIL/uL   Hemoglobin 11.1 (L) 13.0 - 17.0 g/dL   HCT 34.9 (L) 39.0 - 52.0 %   MCV 76.2 (L) 80.0 - 100.0 fL   MCH 24.2 (L) 26.0 - 34.0 pg   MCHC 31.8 30.0 - 36.0 g/dL   RDW 18.2 (H) 11.5 - 15.5 %   Platelets 177 150 - 400 K/uL   nRBC 0.0 0.0 - 0.2 %    Comment: Performed at [facility], [address] [place], [place] [address]  CBG monitoring, ED     Status: None   Collection Time: 08/12/22  7:21 AM  Result Value Ref Range   Glucose-Capillary 94 70 - 99 mg/dL    Comment: Glucose reference range applies only to samples taken after fasting for at least 8 hours.  Basic metabolic panel     Status: Abnormal   Collection Time: 08/12/22  9:17 AM  Result Value Ref Range   Sodium 139 135 - 145 mmol/L   Potassium 3.5 3.5 - 5.1 mmol/L   Chloride 99 98 - 111 mmol/L   CO2 28 22 - 32 mmol/L   Glucose, Bld 131 (H) 70 - 99 mg/dL    Comment: Glucose reference range applies only to samples taken after fasting for at least 8 hours.  BUN 32 (H) 8 - 23 mg/dL   Creatinine, Ser 2.45 (H) 0.61 - 1.24 mg/dL   Calcium  8.7 (L) 8.9 - 10.3 mg/dL   GFR, Estimated 25 (L) >60 mL/min    Comment: (NOTE) Calculated using the CKD-EPI Creatinine Equation (2021)    Anion gap 12 5 - 15    Comment: Performed at [facility], [address] [place], [place] [address]  CBG monitoring, ED     Status: Abnormal   Collection Time: 08/12/22 11:33 AM  Result Value Ref Range   Glucose-Capillary 113 (H) 70 - 99 mg/dL    Comment: Glucose reference range applies only to samples taken after fasting for at least 8 hours.  CBG monitoring, ED     Status: Abnormal   Collection Time: 08/12/22  4:53 PM  Result Value Ref Range   Glucose-Capillary 109 (H) 70 - 99 mg/dL    Comment: Glucose reference range applies only to samples taken after fasting for at least 8 hours.  CBG monitoring, ED     Status: Abnormal   Collection Time: 08/12/22  8:02 PM  Result Value Ref Range   Glucose-Capillary 155 (H) 70 - 99 mg/dL    Comment: Glucose reference range applies only to samples taken after fasting for at least 8 hours.  Basic metabolic panel     Status: Abnormal   Collection Time: 08/13/22  5:30 AM  Result Value Ref Range   Sodium 141 135 - 145 mmol/L   Potassium 3.7 3.5 - 5.1 mmol/L   Chloride 101 98 - 111 mmol/L   CO2 30 22 - 32 mmol/L   Glucose, Bld 84 70 - 99 mg/dL    Comment: Glucose reference range applies only to samples taken after fasting for at least 8 hours.   BUN 33 (H) 8 - 23 mg/dL   Creatinine, Ser 2.44 (H) 0.61 - 1.24 mg/dL   Calcium 8.6 (L) 8.9 - 10.3 mg/dL   GFR, Estimated 25 (L) >60 mL/min    Comment: (NOTE) Calculated using the CKD-EPI Creatinine Equation (2021)    Anion gap 10 5 - 15    Comment: Performed at [facility], [address] [place], [place] [address]  Procalcitonin - Baseline     Status: None   Collection Time: 08/13/22  5:30 AM  Result Value Ref Range   Procalcitonin 1.83 ng/mL    Comment:        Interpretation: PCT > 0.5 ng/mL and <= 2 ng/mL: Systemic infection (sepsis)  is possible, but other conditions are known to elevate PCT as well. (NOTE)       Sepsis PCT Algorithm           Lower Respiratory Tract                                      Infection PCT Algorithm    ----------------------------     ----------------------------         PCT < 0.25 ng/mL                PCT < 0.10 ng/mL          Strongly encourage             Strongly discourage   discontinuation of antibiotics    initiation of antibiotics    ----------------------------     -----------------------------       PCT 0.25 -  0.50 ng/mL            PCT 0.10 - 0.25 ng/mL               OR       >80% decrease in PCT            Discourage initiation of                                            antibiotics      Encourage discontinuation           of antibiotics    ----------------------------     -----------------------------         PCT >= 0.50 ng/mL              PCT 0.26 - 0.50 ng/mL                AND       <80% decrease in PCT             Encourage initiation of                                             antibiotics       Encourage continuation           of antibiotics    ----------------------------     -----------------------------        PCT >= 0.50 ng/mL                  PCT > 0.50 ng/mL               AND         increase in PCT                  Strongly encourage                                      initiation of antibiotics    Strongly encourage escalation           of antibiotics                                     -----------------------------                                           PCT <= 0.25 ng/mL                                                 OR                                        > 80% decrease in PCT  Discontinue / Do not initiate                                             antibiotics  Performed at [facility], [address] [place], [place] [address]   CBG monitoring, ED     Status: None   Collection Time:  08/13/22  8:22 AM  Result Value Ref Range   Glucose-Capillary 94 70 - 99 mg/dL    Comment: Glucose reference range applies only to samples taken after fasting for at least 8 hours.  CBG monitoring, ED     Status: Abnormal   Collection Time: 08/13/22 12:41 PM  Result Value Ref Range   Glucose-Capillary 106 (H) 70 - 99 mg/dL    Comment: Glucose reference range applies only to samples taken after fasting for at least 8 hours.  Glucose, capillary     Status: Abnormal   Collection Time: 08/13/22  4:56 PM  Result Value Ref Range   Glucose-Capillary 115 (H) 70 - 99 mg/dL    Comment: Glucose reference range applies only to samples taken after fasting for at least 8 hours.  Glucose, capillary     Status: None   Collection Time: 08/13/22  8:16 PM  Result Value Ref Range   Glucose-Capillary 86 70 - 99 mg/dL    Comment: Glucose reference range applies only to samples taken after fasting for at least 8 hours.  Basic metabolic panel     Status: Abnormal   Collection Time: 08/14/22  8:05 AM  Result Value Ref Range   Sodium 141 135 - 145 mmol/L   Potassium 3.6 3.5 - 5.1 mmol/L   Chloride 101 98 - 111 mmol/L   CO2 32 22 - 32 mmol/L   Glucose, Bld 104 (H) 70 - 99 mg/dL    Comment: Glucose reference range applies only to samples taken after fasting for at least 8 hours.   BUN 30 (H) 8 - 23 mg/dL   Creatinine, Ser 2.43 (H) 0.61 - 1.24 mg/dL   Calcium 8.9 8.9 - 10.3 mg/dL   GFR, Estimated 25 (L) >60 mL/min    Comment: (NOTE) Calculated using the CKD-EPI Creatinine Equation (2021)    Anion gap 8 5 - 15    Comment: Performed at [facility], [address] [place], [place] [address]  Glucose, capillary     Status: None   Collection Time: 08/14/22  8:11 AM  Result Value Ref Range   Glucose-Capillary 96 70 - 99 mg/dL    Comment: Glucose reference range applies only to samples taken after fasting for at least 8 hours.  Glucose, capillary     Status: Abnormal   Collection Time: 08/14/22  11:50 AM  Result Value Ref Range   Glucose-Capillary 130 (H) 70 - 99 mg/dL    Comment: Glucose reference range applies only to samples taken after fasting for at least 8 hours.  Glucose, capillary     Status: None   Collection Time: 08/14/22  5:15 PM  Result Value Ref Range   Glucose-Capillary 90 70 - 99 mg/dL    Comment: Glucose reference range applies only to samples taken after fasting for at least 8 hours.  Glucose, capillary     Status: None   Collection Time: 08/14/22  8:24 PM  Result Value Ref Range   Glucose-Capillary 98 70 - 99 mg/dL  Comment: Glucose reference range applies only to samples taken after fasting for at least 8 hours.  CBC     Status: Abnormal   Collection Time: 08/15/22  2:05 AM  Result Value Ref Range   WBC 10.5 4.0 - 10.5 K/uL   RBC 4.30 4.22 - 5.81 MIL/uL   Hemoglobin 10.5 (L) 13.0 - 17.0 g/dL   HCT 32.8 (L) 39.0 - 52.0 %   MCV 76.3 (L) 80.0 - 100.0 fL   MCH 24.4 (L) 26.0 - 34.0 pg   MCHC 32.0 30.0 - 36.0 g/dL   RDW 18.1 (H) 11.5 - 15.5 %   Platelets 172 150 - 400 K/uL   nRBC 0.0 0.0 - 0.2 %    Comment: Performed at [facility], [address] [place], [place] [id number]  Basic metabolic panel     Status: Abnormal   Collection Time: 08/15/22  2:05 AM  Result Value Ref Range   Sodium 142 135 - 145 mmol/L   Potassium 3.4 (L) 3.5 - 5.1 mmol/L   Chloride 102 98 - 111 mmol/L   CO2 32 22 - 32 mmol/L   Glucose, Bld 106 (H) 70 - 99 mg/dL    Comment: Glucose reference range applies only to samples taken after fasting for at least 8 hours.   BUN 32 (H) 8 - 23 mg/dL   Creatinine, Ser 2.38 (H) 0.61 - 1.24 mg/dL   Calcium 8.7 (L) 8.9 - 10.3 mg/dL   GFR, Estimated 26 (L) >60 mL/min    Comment: (NOTE) Calculated using the CKD-EPI Creatinine Equation (2021)    Anion gap 8 5 - 15    Comment: Performed at [facility], [address] [place], [place] [address]  Glucose, capillary     Status: None   Collection Time: 08/15/22  7:34 AM   Result Value Ref Range   Glucose-Capillary 91 70 - 99 mg/dL    Comment: Glucose reference range applies only to samples taken after fasting for at least 8 hours.  Glucose, capillary     Status: None   Collection Time: 08/15/22 11:55 AM  Result Value Ref Range   Glucose-Capillary 95 70 - 99 mg/dL    Comment: Glucose reference range applies only to samples taken after fasting for at least 8 hours.  Glucose, capillary     Status: Abnormal   Collection Time: 08/15/22  4:38 PM  Result Value Ref Range   Glucose-Capillary 105 (H) 70 - 99 mg/dL    Comment: Glucose reference range applies only to samples taken after fasting for at least 8 hours.  Glucose, capillary     Status: Abnormal   Collection Time: 08/15/22  9:37 PM  Result Value Ref Range   Glucose-Capillary 123 (H) 70 - 99 mg/dL    Comment: Glucose reference range applies only to samples taken after fasting for at least 8 hours.  CBC     Status: Abnormal   Collection Time: 08/16/22  6:14 AM  Result Value Ref Range   WBC 9.5 4.0 - 10.5 K/uL   RBC 4.54 4.22 - 5.81 MIL/uL   Hemoglobin 10.8 (L) 13.0 - 17.0 g/dL   HCT 34.5 (L) 39.0 - 52.0 %   MCV 76.0 (L) 80.0 - 100.0 fL   MCH 23.8 (L) 26.0 - 34.0 pg   MCHC 31.3 30.0 - 36.0 g/dL   RDW 18.0 (H) 11.5 - 15.5 %   Platelets 170 150 - 400 K/uL   nRBC 0.0 0.0 - 0.2 %  Comment: Performed at [facility], [address] [place], [place] [id number]  Basic metabolic panel     Status: Abnormal   Collection Time: 08/16/22  6:14 AM  Result Value Ref Range   Sodium 140 135 - 145 mmol/L   Potassium 3.9 3.5 - 5.1 mmol/L   Chloride 103 98 - 111 mmol/L   CO2 30 22 - 32 mmol/L   Glucose, Bld 93 70 - 99 mg/dL    Comment: Glucose reference range applies only to samples taken after fasting for at least 8 hours.   BUN 31 (H) 8 - 23 mg/dL   Creatinine, Ser 2.49 (H) 0.61 - 1.24 mg/dL   Calcium 8.7 (L) 8.9 - 10.3 mg/dL   GFR, Estimated 24 (L) >60 mL/min    Comment: (NOTE) Calculated using  the CKD-EPI Creatinine Equation (2021)    Anion gap 7 5 - 15    Comment: Performed at [facility], [address] [place], [place] [address]  Glucose, capillary     Status: None   Collection Time: 08/16/22  8:12 AM  Result Value Ref Range   Glucose-Capillary 93 70 - 99 mg/dL    Comment: Glucose reference range applies only to samples taken after fasting for at least 8 hours.  Glucose, capillary     Status: Abnormal   Collection Time: 08/16/22 11:47 AM  Result Value Ref Range   Glucose-Capillary 178 (H) 70 - 99 mg/dL    Comment: Glucose reference range applies only to samples taken after fasting for at least 8 hours.  Glucose, capillary     Status: Abnormal   Collection Time: 08/16/22  3:07 PM  Result Value Ref Range   Glucose-Capillary 128 (H) 70 - 99 mg/dL    Comment: Glucose reference range applies only to samples taken after fasting for at least 8 hours.  Basic Metabolic Panel (BMET)     Status: Abnormal   Collection Time: 08/22/22 11:20 AM  Result Value Ref Range   Sodium 140 135 - 145 mmol/L   Potassium 4.0 3.5 - 5.1 mmol/L   Chloride 101 98 - 111 mmol/L   CO2 30 22 - 32 mmol/L   Glucose, Bld 69 (L) 70 - 99 mg/dL    Comment: Glucose reference range applies only to samples taken after fasting for at least 8 hours.   BUN 40 (H) 8 - 23 mg/dL   Creatinine, Ser 2.63 (H) 0.61 - 1.24 mg/dL   Calcium 8.8 (L) 8.9 - 10.3 mg/dL   GFR, Estimated 23 (L) >60 mL/min    Comment: (NOTE) Calculated using the CKD-EPI Creatinine Equation (2021)    Anion gap 9 5 - 15    Comment: Performed at [facility], [address] [place], [place] [address]  B Nat Peptide     Status: Abnormal   Collection Time: 08/22/22 11:20 AM  Result Value Ref Range   B Natriuretic Peptide 683.2 (H) 0.0 - 100.0 pg/mL    Comment: Performed at [facility], [address] [place], [place] [address]  Lipid Profile     Status: Abnormal   Collection Time: 08/22/22 11:20 AM  Result  Value Ref Range   Cholesterol 76 0 - 200 mg/dL   Triglycerides 44 <150 mg/dL   HDL 40 (L) >40 mg/dL   Total CHOL/HDL Ratio 1.9 RATIO   VLDL 9 0 - 40 mg/dL   LDL Cholesterol 27 0 - 99 mg/dL    Comment:  Total Cholesterol/HDL:CHD Risk Coronary Heart Disease Risk Table                     Men   Women  1/2 Average Risk   3.4   3.3  Average Risk       5.0   4.4  2 X Average Risk   9.6   7.1  3 X Average Risk  23.4   11.0        Use the calculated Patient Ratio above and the CHD Risk Table to determine the patient'Kohner [place]lick CHD Risk.        ATP III CLASSIFICATION (LDL):  <100     mg/dL   Optimal  100-129  mg/dL   Near or Above                    Optimal  130-159  mg/dL   Borderline  160-189  mg/dL   High  >190     mg/dL   Very High Performed at [facility], [address] [place], [place] [id number]   Basic Metabolic Panel (BMET)     Status: Abnormal   Collection Time: 09/12/22 11:18 AM  Result Value Ref Range   Glucose 168 (H) 70 - 99 mg/dL   BUN 39 (H) 8 - 27 mg/dL   Creatinine, Ser 2.93 (H) 0.76 - 1.27 mg/dL   eGFR 20 (L) >59 mL/min/1.73   BUN/Creatinine Ratio 13 10 - 24   Sodium 148 (H) 134 - 144 mmol/L   Potassium 4.3 3.5 - 5.2 mmol/L   Chloride 103 96 - 106 mmol/L   CO2 27 20 - 29 mmol/L   Calcium 9.4 8.6 - 10.2 mg/dL  Comprehensive metabolic panel     Status: Abnormal   Collection Time: 10/14/22 10:03 AM  Result Value Ref Range   Glucose 61 (L) 70 - 99 mg/dL   BUN 32 (H) 8 - 27 mg/dL   Creatinine, Ser 2.62 (H) 0.76 - 1.27 mg/dL   eGFR 23 (L) >59 mL/min/1.73   BUN/Creatinine Ratio 12 10 - 24   Sodium 147 (H) 134 - 144 mmol/L   Potassium 4.4 3.5 - 5.2 mmol/L   Chloride 101 96 - 106 mmol/L   CO2 28 20 - 29 mmol/L   Calcium 9.4 8.6 - 10.2 mg/dL   Total Protein 6.8 6.0 - 8.5 g/dL   Albumin 4.0 3.7 - 4.7 g/dL   Globulin, Total 2.8 1.5 - 4.5 g/dL   Albumin/Globulin Ratio 1.4 1.2 - 2.2   Bilirubin Total 0.8 0.0 - 1.2 mg/dL   Alkaline Phosphatase 132 (H)  44 - 121 IU/L   AST 11 0 - 40 IU/L   ALT 8 0 - 44 IU/L  Lipid Panel w/o Chol/HDL Ratio     Status: Abnormal   Collection Time: 10/14/22 10:03 AM  Result Value Ref Range   Cholesterol, Total 77 (L) 100 - 199 mg/dL   Triglycerides 56 0 - 149 mg/dL   HDL 46 >39 mg/dL   VLDL Cholesterol Cal 14 5 - 40 mg/dL   LDL Chol Calc (NIH) 17 0 - 99 mg/dL  Hgb A1c w/o eAG     Status: Abnormal   Collection Time: 10/14/22 10:03 AM  Result Value Ref Range   Hgb A1c MFr Bld 6.3 (H) 4.8 - 5.6 %    Comment:          Prediabetes: 5.7 - 6.4  Diabetes: >6.4          Glycemic control for adults with diabetes: <7.0       Assessment & Plan:   Problem List Items Addressed This Visit   None   No follow-ups on file.   Total time spent: 30 minutes  Volanda Napoleon, MD  10/16/2022

## 2022-10-28 ENCOUNTER — Telehealth: Payer: Self-pay

## 2022-10-28 NOTE — Telephone Encounter (Signed)
Patient wife called stating that they dont know what dose of the isosorbide he should be taking the 30mg  or 60mg ?

## 2022-11-04 ENCOUNTER — Telehealth: Payer: Self-pay

## 2022-11-04 ENCOUNTER — Other Ambulatory Visit: Payer: Self-pay

## 2022-11-04 NOTE — Telephone Encounter (Signed)
Pt's wife called and left vm regarding needing refill on pt's plavix, I looked in old system and looks like it hasn't been filled since 06/2021. I just wanted to check with you if he's supposed to be on rx or not? Please advise

## 2022-11-05 ENCOUNTER — Other Ambulatory Visit: Payer: Self-pay | Admitting: Cardiovascular Disease

## 2022-11-05 MED ORDER — CLOPIDOGREL BISULFATE 75 MG PO TABS: 75.0000 mg | ORAL_TABLET | Freq: Every day | ORAL | 1 refills | Status: DC

## 2022-11-07 ENCOUNTER — Encounter: Payer: Self-pay | Admitting: Cardiovascular Disease

## 2022-11-07 ENCOUNTER — Ambulatory Visit (INDEPENDENT_AMBULATORY_CARE_PROVIDER_SITE_OTHER): Payer: Medicare Other | Admitting: Cardiovascular Disease

## 2022-11-07 VITALS — BP 96/58 | HR 64 | Ht 71.0 in | Wt 200.0 lb

## 2022-11-07 DIAGNOSIS — I5082 Biventricular heart failure: Secondary | ICD-10-CM

## 2022-11-07 DIAGNOSIS — I5020 Unspecified systolic (congestive) heart failure: Secondary | ICD-10-CM

## 2022-11-07 DIAGNOSIS — I480 Paroxysmal atrial fibrillation: Secondary | ICD-10-CM | POA: Diagnosis not present

## 2022-11-07 DIAGNOSIS — I10 Essential (primary) hypertension: Secondary | ICD-10-CM | POA: Diagnosis not present

## 2022-11-07 DIAGNOSIS — I25810 Atherosclerosis of coronary artery bypass graft(s) without angina pectoris: Secondary | ICD-10-CM

## 2022-11-07 NOTE — Progress Notes (Signed)
Cardiology Office Note   Date:  11/07/2022   ID:  Jacob Silva, DOB 1931/10/16, MRN [id number]  PCP:  Sherron Monday, MD  Cardiologist:  Adrian Blackwater, MD      History of Present Illness: Jacob Silva is a 89 y.o. male who presents for  Chief Complaint  Patient presents with   Follow-up    4 week follow up    Patient in office for 4 week follow up. Denies chest pain, dizziness. Shortness of breath unchanged.   Shortness of Breath This is a chronic problem. The current episode started more than 1 month ago. The problem occurs constantly. The problem has been unchanged. The symptoms are aggravated by any activity. Treatments tried: on 2 L O2 via [place]. His past medical history is significant for CAD, COPD and a heart failure.    Past Medical History:  Diagnosis Date   CAD (coronary artery disease)    a. 2013 s/p CABG x 3 (LIMA->dLAD, VG->RPDA, VG->OM1); b. 12/2021 NSTEMI (peak HsTrop 641)-->Med rx in setting of CKD IV. EF 55-60% w/ rwma by echo.   CHF (congestive heart failure)    Chronic heart failure with preserved ejection fraction (HFpEF)    a. 12/2021 Echo: EF 55-60%, no rwma, sev RV dysfxn w/ RVSP 69.21mmHg. Sev dil RA. Triv MR. Ao sclerosis.   CKD (chronic kidney disease), stage IV    COPD (chronic obstructive pulmonary disease)    a. Home O2.   Diabetes    Essential hypertension    Hyperlipidemia LDL goal <70    NSTEMI (non-ST elevated myocardial infarction) 01/13/2022   OSA (obstructive sleep apnea)    Pulmonary fibrosis    Pulmonary HTN      Past Surgical History:  Procedure Laterality Date   BACK SURGERY     HERNIA REPAIR     KNEE SURGERY Right    TRIPLE BYPASS       Current Outpatient Medications  Medication Sig Dispense Refill   acetaminophen (TYLENOL) 325 MG tablet Take 650 mg by mouth every 6 (six) hours as needed for mild pain.     albuterol (VENTOLIN HFA) 108 (90 Base) MCG/ACT inhaler Inhale 1 puff into the lungs every 6 (six) hours as  needed for wheezing or shortness of breath.     allopurinol (ZYLOPRIM) 100 MG tablet Take 100 mg by mouth daily.     apixaban (ELIQUIS) 2.5 MG TABS tablet Take 1 tablet (2.5 mg total) by mouth 2 (two) times daily. 60 tablet 5   atorvastatin (LIPITOR) 80 MG tablet Take 40 mg by mouth at bedtime.     Cholecalciferol (VITAMIN D3) 50 MCG (2000 UT) CAPS Take by mouth.     clopidogrel (PLAVIX) 75 MG tablet Take 1 tablet (75 mg total) by mouth daily. 90 tablet 1   clotrimazole-betamethasone (LOTRISONE) cream Apply 1 Application topically 2 (two) times daily. 45 g 3   dapagliflozin propanediol (FARXIGA) 10 MG TABS tablet Take 10 mg by mouth daily.     diclofenac Sodium (VOLTAREN) 1 % GEL Apply 2 g topically 4 (four) times daily as needed (pain).     famotidine (PEPCID) 20 MG tablet Take 1 tablet (20 mg total) by mouth daily as needed for heartburn or indigestion. 30 tablet 2   formoterol (PERFOROMIST) 20 MCG/2ML nebulizer solution USE 1 VIAL  IN  NEBULIZER TWICE  DAILY - morning and evening 60 mL 11   gabapentin (NEURONTIN) 300 MG capsule Take 1 capsule (300 mg total)  by mouth 2 (two) times daily. 60 capsule 0   gabapentin (NEURONTIN) 400 MG capsule Take 400 mg by mouth as directed. Take 2 capsules by mouth twice daily     insulin glargine (LANTUS) 100 UNIT/ML injection Inject 25 Units into the skin daily.     isosorbide mononitrate (IMDUR) 60 MG 24 hr tablet Take 60 mg by mouth daily.     levothyroxine (SYNTHROID) 100 MCG tablet Take 100 mcg by mouth every morning.     loperamide (IMODIUM) 2 MG capsule Take 2 mg by mouth daily as needed for diarrhea or loose stools.     loratadine (CLARITIN) 10 MG tablet Take 10 mg by mouth daily as needed for allergies.     metolazone (ZAROXOLYN) 2.5 MG tablet Take 2.5 mg by mouth as needed (edema).     mometasone (ELOCON) 0.1 % ointment      potassium chloride (KLOR-CON) 10 MEQ tablet Take 10 mEq by mouth daily.     Propylene Glycol (SYSTANE BALANCE) 0.6 % SOLN Place  1 drop into both eyes daily as needed (dry eyes).     torsemide (DEMADEX) 20 MG tablet Take 2 tablets AM and another tablet if needed in the PM 150 tablet 3   umeclidinium-vilanterol (ANORO ELLIPTA) 62.5-25 MCG/INH AEPB Inhale 1 puff into the lungs daily.     No current facility-administered medications for this visit.    Allergies:   Jardiance [empagliflozin], Penicillin g, Penicillins, and Tramadol    Social History:   reports that he quit smoking about 49 years ago. His smoking use included cigarettes. He has never used smokeless tobacco. He reports that he does not drink alcohol and does not use drugs.   Family History:  family history includes Diabetes in his mother; Heart attack in his brother; Kidney disease in his mother; Throat cancer in his brother.    ROS:     Review of Systems  Constitutional: Negative.   HENT: Negative.    Eyes: Negative.   Respiratory:  Positive for shortness of breath.   Cardiovascular: Negative.   Gastrointestinal: Negative.   Genitourinary: Negative.   Musculoskeletal: Negative.   Skin: Negative.   Neurological: Negative.   Endo/Heme/Allergies: Negative.   Psychiatric/Behavioral: Negative.    All other systems reviewed and are negative.   All other systems are reviewed and negative.   PHYSICAL EXAM: VS:  BP (!) 96/58   Pulse 64   Ht 5\' 11"  (1.803 m)   Wt 200 lb (90.7 kg)   SpO2 (!) 88% Comment: 2L  BMI 27.89 kg/m  , BMI Body mass index is 27.89 kg/m. Last weight:  Wt Readings from Last 3 Encounters:  11/07/22 200 lb (90.7 kg)  10/16/22 205 lb (93 kg)  10/10/22 203 lb 12.8 oz (92.4 kg)   Physical Exam Vitals reviewed.  Constitutional:      Appearance: Normal appearance. He is normal weight.  HENT:     Head: Normocephalic.     Nose: Nose normal.     Mouth/Throat:     Mouth: Mucous membranes are moist.  Eyes:     Pupils: Pupils are equal, round, and reactive to light.  Cardiovascular:     Rate and Rhythm: Normal rate and  regular rhythm.     Pulses: Normal pulses.     Heart sounds: Normal heart sounds.  Pulmonary:     Effort: Pulmonary effort is normal.  Abdominal:     General: Abdomen is flat. Bowel sounds are normal.  Musculoskeletal:  General: Normal range of motion.     Cervical back: Normal range of motion.  Skin:    General: Skin is warm.  Neurological:     General: No focal deficit present.     Mental Status: He is alert.  Psychiatric:        Mood and Affect: Mood normal.     EKG: none today  Recent Labs: 08/11/2022: Magnesium 2.4 08/16/2022: Hemoglobin 10.8; Platelets 170 08/22/2022: B Natriuretic Peptide 683.2 10/14/2022: ALT 8; BUN 32; Creatinine, Ser 2.62; Potassium 4.4; Sodium 147    Lipid Panel    Component Value Date/Time   CHOL 77 (L) 10/14/2022 1003   TRIG 56 10/14/2022 1003   HDL 46 10/14/2022 1003   CHOLHDL 1.9 08/22/2022 1120   VLDL 9 08/22/2022 1120   LDLCALC 17 10/14/2022 1003     Other studies Reviewed: none today  ASSESSMENT AND PLAN:    ICD-10-CM   1. Congestive heart failure with right ventricular systolic dysfunction  I50.82    I50.20     2. 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 Paroxysmal atrial fibrillation  I48.0     4. Essential (primary) hypertension  I10        Problem List Items Addressed This Visit       Cardiovascular and Mediastinum   Congestive heart failure with right ventricular systolic dysfunction - Primary    Patient doing well. Denies chest pain. Shortness of breath unchanged. Patient reports weight this morning at home was 189. Took 1 metolazone last Thursday. Denies dizziness despite low b/p. No changes today.       Relevant Medications   isosorbide mononitrate (IMDUR) 60 MG 24 hr tablet   Atherosclerosis of coronary artery bypass graft(s) without angina pectoris   Relevant Medications   isosorbide mononitrate (IMDUR) 60 MG 24 hr tablet   Atrial  fibrillation   Relevant Medications   isosorbide mononitrate (IMDUR) 60 MG 24 hr tablet   Essential (primary) hypertension   Relevant Medications   isosorbide mononitrate (IMDUR) 60 MG 24 hr tablet     Disposition:   Return in about 4 weeks (around 12/05/2022).    Total time spent: 30 minutes  Signed,  Adrian BlackwaterShaukat Alyn Riedinger, MD  11/07/2022 10:20 AM    [facility]

## 2022-11-07 NOTE — Assessment & Plan Note (Signed)
Patient doing well. Denies chest pain. Shortness of breath unchanged. Patient reports weight this morning at home was 189. Took 1 metolazone last Thursday. Denies dizziness despite low b/p. No changes today.

## 2022-11-27 DEATH — deceased

## 2022-12-10 ENCOUNTER — Ambulatory Visit: Payer: Medicare Other | Admitting: Podiatry

## 2022-12-12 ENCOUNTER — Ambulatory Visit: Payer: Medicare Other | Admitting: Cardiovascular Disease

## 2022-12-26 ENCOUNTER — Encounter: Payer: Medicare Other | Admitting: Cardiology

## 2023-01-21 ENCOUNTER — Ambulatory Visit: Payer: Medicare Other | Admitting: Internal Medicine
